# Patient Record
Sex: Male | Born: 1955 | Race: White | Hispanic: No | Marital: Single | Smoking: Former smoker
Health system: Southern US, Community
[De-identification: ages and names within clinical notes are randomized; demographics above are authoritative.]

## PROBLEM LIST (undated history)

## (undated) DIAGNOSIS — M791 Myalgia, unspecified site: Secondary | ICD-10-CM

## (undated) DIAGNOSIS — K219 Gastro-esophageal reflux disease without esophagitis: Secondary | ICD-10-CM

## (undated) DIAGNOSIS — H269 Unspecified cataract: Secondary | ICD-10-CM

## (undated) DIAGNOSIS — E785 Hyperlipidemia, unspecified: Secondary | ICD-10-CM

## (undated) DIAGNOSIS — Z720 Tobacco use: Secondary | ICD-10-CM

## (undated) DIAGNOSIS — R7303 Prediabetes: Secondary | ICD-10-CM

## (undated) DIAGNOSIS — J189 Pneumonia, unspecified organism: Secondary | ICD-10-CM

## (undated) DIAGNOSIS — J45909 Unspecified asthma, uncomplicated: Secondary | ICD-10-CM

## (undated) DIAGNOSIS — L301 Dyshidrosis [pompholyx]: Secondary | ICD-10-CM

## (undated) DIAGNOSIS — F1010 Alcohol abuse, uncomplicated: Secondary | ICD-10-CM

## (undated) DIAGNOSIS — M1990 Unspecified osteoarthritis, unspecified site: Secondary | ICD-10-CM

## (undated) DIAGNOSIS — J449 Chronic obstructive pulmonary disease, unspecified: Secondary | ICD-10-CM

## (undated) DIAGNOSIS — I10 Essential (primary) hypertension: Secondary | ICD-10-CM

## (undated) DIAGNOSIS — K759 Inflammatory liver disease, unspecified: Secondary | ICD-10-CM

## (undated) HISTORY — DX: Myalgia, unspecified site: M79.10

## (undated) HISTORY — DX: Unspecified cataract: H26.9

## (undated) HISTORY — DX: Alcohol abuse, uncomplicated: F10.10

## (undated) HISTORY — DX: Chronic obstructive pulmonary disease, unspecified: J44.9

## (undated) HISTORY — DX: Unspecified osteoarthritis, unspecified site: M19.90

## (undated) HISTORY — DX: Tobacco use: Z72.0

## (undated) HISTORY — DX: Hyperlipidemia, unspecified: E78.5

## (undated) HISTORY — DX: Gastro-esophageal reflux disease without esophagitis: K21.9

## (undated) HISTORY — DX: Essential (primary) hypertension: I10

## (undated) HISTORY — DX: Dyshidrosis (pompholyx): L30.1

---

## 2000-09-02 HISTORY — PX: HAND SURGERY: SHX662

## 2002-08-31 ENCOUNTER — Ambulatory Visit (HOSPITAL_BASED_OUTPATIENT_CLINIC_OR_DEPARTMENT_OTHER): Admission: RE | Admit: 2002-08-31 | Discharge: 2002-08-31 | Payer: Self-pay | Admitting: Orthopedic Surgery

## 2004-02-08 ENCOUNTER — Ambulatory Visit (HOSPITAL_COMMUNITY): Admission: RE | Admit: 2004-02-08 | Discharge: 2004-02-08 | Payer: Self-pay | Admitting: *Deleted

## 2004-07-22 ENCOUNTER — Inpatient Hospital Stay (HOSPITAL_COMMUNITY): Admission: AD | Admit: 2004-07-22 | Discharge: 2004-07-27 | Payer: Self-pay | Admitting: Internal Medicine

## 2004-07-22 ENCOUNTER — Ambulatory Visit: Payer: Self-pay | Admitting: Internal Medicine

## 2004-08-07 ENCOUNTER — Ambulatory Visit: Payer: Self-pay | Admitting: Internal Medicine

## 2004-09-18 ENCOUNTER — Ambulatory Visit (HOSPITAL_COMMUNITY): Admission: RE | Admit: 2004-09-18 | Discharge: 2004-09-18 | Payer: Self-pay | Admitting: Internal Medicine

## 2004-09-18 ENCOUNTER — Ambulatory Visit: Payer: Self-pay | Admitting: Internal Medicine

## 2004-10-02 ENCOUNTER — Ambulatory Visit: Payer: Self-pay | Admitting: Internal Medicine

## 2004-12-20 ENCOUNTER — Ambulatory Visit: Payer: Self-pay | Admitting: Internal Medicine

## 2004-12-28 ENCOUNTER — Ambulatory Visit: Payer: Self-pay

## 2005-01-01 ENCOUNTER — Ambulatory Visit: Payer: Self-pay | Admitting: Internal Medicine

## 2005-01-08 ENCOUNTER — Ambulatory Visit: Payer: Self-pay | Admitting: Internal Medicine

## 2005-02-08 LAB — FECAL OCCULT BLOOD, GUAIAC: Fecal Occult Blood: NEGATIVE

## 2006-04-21 ENCOUNTER — Ambulatory Visit: Payer: Self-pay | Admitting: Hospitalist

## 2006-10-23 ENCOUNTER — Ambulatory Visit: Payer: Self-pay | Admitting: Internal Medicine

## 2006-10-23 ENCOUNTER — Ambulatory Visit (HOSPITAL_COMMUNITY): Admission: RE | Admit: 2006-10-23 | Discharge: 2006-10-23 | Payer: Self-pay | Admitting: Hospitalist

## 2006-10-23 ENCOUNTER — Encounter (INDEPENDENT_AMBULATORY_CARE_PROVIDER_SITE_OTHER): Payer: Self-pay | Admitting: Pulmonary Disease

## 2006-10-23 DIAGNOSIS — E785 Hyperlipidemia, unspecified: Secondary | ICD-10-CM | POA: Insufficient documentation

## 2006-10-23 DIAGNOSIS — Z8719 Personal history of other diseases of the digestive system: Secondary | ICD-10-CM | POA: Insufficient documentation

## 2006-10-23 DIAGNOSIS — I10 Essential (primary) hypertension: Secondary | ICD-10-CM | POA: Insufficient documentation

## 2006-10-23 DIAGNOSIS — F1010 Alcohol abuse, uncomplicated: Secondary | ICD-10-CM | POA: Insufficient documentation

## 2006-10-23 DIAGNOSIS — K219 Gastro-esophageal reflux disease without esophagitis: Secondary | ICD-10-CM | POA: Insufficient documentation

## 2006-10-23 DIAGNOSIS — F528 Other sexual dysfunction not due to a substance or known physiological condition: Secondary | ICD-10-CM | POA: Insufficient documentation

## 2006-10-23 DIAGNOSIS — L301 Dyshidrosis [pompholyx]: Secondary | ICD-10-CM | POA: Insufficient documentation

## 2006-10-23 DIAGNOSIS — R000 Tachycardia, unspecified: Secondary | ICD-10-CM | POA: Insufficient documentation

## 2006-10-23 DIAGNOSIS — Z87891 Personal history of nicotine dependence: Secondary | ICD-10-CM | POA: Insufficient documentation

## 2006-10-27 ENCOUNTER — Telehealth: Payer: Self-pay | Admitting: *Deleted

## 2006-10-29 ENCOUNTER — Telehealth (INDEPENDENT_AMBULATORY_CARE_PROVIDER_SITE_OTHER): Payer: Self-pay | Admitting: Pulmonary Disease

## 2006-11-24 ENCOUNTER — Ambulatory Visit: Payer: Self-pay | Admitting: Internal Medicine

## 2007-03-27 ENCOUNTER — Telehealth (INDEPENDENT_AMBULATORY_CARE_PROVIDER_SITE_OTHER): Payer: Self-pay | Admitting: *Deleted

## 2007-04-01 ENCOUNTER — Telehealth: Payer: Self-pay | Admitting: Internal Medicine

## 2007-04-01 ENCOUNTER — Ambulatory Visit: Payer: Self-pay | Admitting: Hospitalist

## 2007-04-01 DIAGNOSIS — J069 Acute upper respiratory infection, unspecified: Secondary | ICD-10-CM | POA: Insufficient documentation

## 2007-04-01 DIAGNOSIS — J309 Allergic rhinitis, unspecified: Secondary | ICD-10-CM | POA: Insufficient documentation

## 2007-05-12 ENCOUNTER — Encounter (INDEPENDENT_AMBULATORY_CARE_PROVIDER_SITE_OTHER): Payer: Self-pay | Admitting: *Deleted

## 2007-05-12 ENCOUNTER — Ambulatory Visit: Payer: Self-pay | Admitting: Internal Medicine

## 2007-05-13 LAB — CONVERTED CEMR LAB
ALT: 77 units/L — ABNORMAL HIGH (ref 0–53)
AST: 72 units/L — ABNORMAL HIGH (ref 0–37)
Albumin: 4.6 g/dL (ref 3.5–5.2)
Alkaline Phosphatase: 71 units/L (ref 39–117)
BUN: 8 mg/dL (ref 6–23)
CO2: 27 meq/L (ref 19–32)
Calcium: 10 mg/dL (ref 8.4–10.5)
Chloride: 99 meq/L (ref 96–112)
Creatinine, Ser: 0.82 mg/dL (ref 0.40–1.50)
Glucose, Bld: 106 mg/dL — ABNORMAL HIGH (ref 70–99)
Potassium: 4.1 meq/L (ref 3.5–5.3)
Sodium: 140 meq/L (ref 135–145)
Total Bilirubin: 0.6 mg/dL (ref 0.3–1.2)
Total Protein: 7.7 g/dL (ref 6.0–8.3)

## 2007-05-20 ENCOUNTER — Encounter (INDEPENDENT_AMBULATORY_CARE_PROVIDER_SITE_OTHER): Payer: Self-pay | Admitting: *Deleted

## 2007-05-20 ENCOUNTER — Ambulatory Visit: Payer: Self-pay | Admitting: Internal Medicine

## 2007-05-20 LAB — CONVERTED CEMR LAB
BUN: 9 mg/dL (ref 6–23)
CO2: 25 meq/L (ref 19–32)
Calcium: 9.7 mg/dL (ref 8.4–10.5)
Chloride: 97 meq/L (ref 96–112)
Creatinine, Ser: 0.78 mg/dL (ref 0.40–1.50)
Glucose, Bld: 128 mg/dL — ABNORMAL HIGH (ref 70–99)
Potassium: 4.2 meq/L (ref 3.5–5.3)
Sodium: 134 meq/L — ABNORMAL LOW (ref 135–145)

## 2007-05-21 ENCOUNTER — Telehealth: Payer: Self-pay | Admitting: *Deleted

## 2007-06-16 ENCOUNTER — Telehealth: Payer: Self-pay | Admitting: *Deleted

## 2007-06-16 ENCOUNTER — Ambulatory Visit (HOSPITAL_COMMUNITY): Admission: RE | Admit: 2007-06-16 | Discharge: 2007-06-16 | Payer: Self-pay | Admitting: Infectious Diseases

## 2007-06-16 ENCOUNTER — Ambulatory Visit: Payer: Self-pay | Admitting: Hospitalist

## 2007-06-23 ENCOUNTER — Telehealth: Payer: Self-pay | Admitting: *Deleted

## 2007-06-30 ENCOUNTER — Ambulatory Visit: Payer: Self-pay | Admitting: Internal Medicine

## 2007-06-30 ENCOUNTER — Ambulatory Visit (HOSPITAL_COMMUNITY): Admission: RE | Admit: 2007-06-30 | Discharge: 2007-06-30 | Payer: Self-pay | Admitting: Hospitalist

## 2007-09-14 ENCOUNTER — Ambulatory Visit: Payer: Self-pay | Admitting: Internal Medicine

## 2007-09-14 ENCOUNTER — Telehealth: Payer: Self-pay | Admitting: *Deleted

## 2007-09-14 ENCOUNTER — Encounter (INDEPENDENT_AMBULATORY_CARE_PROVIDER_SITE_OTHER): Payer: Self-pay | Admitting: *Deleted

## 2007-09-14 DIAGNOSIS — R059 Cough, unspecified: Secondary | ICD-10-CM | POA: Insufficient documentation

## 2007-09-14 DIAGNOSIS — R05 Cough: Secondary | ICD-10-CM

## 2007-09-14 LAB — CONVERTED CEMR LAB
ALT: 37 units/L (ref 0–53)
AST: 32 units/L (ref 0–37)
Albumin: 4.2 g/dL (ref 3.5–5.2)
Alkaline Phosphatase: 66 units/L (ref 39–117)
BUN: 7 mg/dL (ref 6–23)
CO2: 26 meq/L (ref 19–32)
Calcium: 9.4 mg/dL (ref 8.4–10.5)
Chloride: 96 meq/L (ref 96–112)
Cholesterol: 156 mg/dL (ref 0–200)
Creatinine, Ser: 0.71 mg/dL (ref 0.40–1.50)
Glucose, Bld: 76 mg/dL (ref 70–99)
HDL: 78 mg/dL (ref >39)
LDL Cholesterol: 58 mg/dL (ref 0–99)
Potassium: 3.6 meq/L (ref 3.5–5.3)
Sodium: 135 meq/L (ref 135–145)
Total Bilirubin: 0.3 mg/dL (ref 0.3–1.2)
Total CHOL/HDL Ratio: 2
Total Protein: 7 g/dL (ref 6.0–8.3)
Triglycerides: 100 mg/dL (ref <150)
VLDL: 20 mg/dL (ref 0–40)

## 2007-09-17 ENCOUNTER — Ambulatory Visit (HOSPITAL_COMMUNITY): Admission: RE | Admit: 2007-09-17 | Discharge: 2007-09-17 | Payer: Self-pay | Admitting: Internal Medicine

## 2007-09-23 ENCOUNTER — Telehealth (INDEPENDENT_AMBULATORY_CARE_PROVIDER_SITE_OTHER): Payer: Self-pay | Admitting: *Deleted

## 2007-09-29 ENCOUNTER — Telehealth (INDEPENDENT_AMBULATORY_CARE_PROVIDER_SITE_OTHER): Payer: Self-pay | Admitting: *Deleted

## 2007-10-29 ENCOUNTER — Telehealth: Payer: Self-pay | Admitting: *Deleted

## 2007-11-01 DIAGNOSIS — M791 Myalgia, unspecified site: Secondary | ICD-10-CM

## 2007-11-01 HISTORY — DX: Myalgia, unspecified site: M79.10

## 2007-11-26 ENCOUNTER — Telehealth (INDEPENDENT_AMBULATORY_CARE_PROVIDER_SITE_OTHER): Payer: Self-pay | Admitting: *Deleted

## 2007-11-27 ENCOUNTER — Telehealth (INDEPENDENT_AMBULATORY_CARE_PROVIDER_SITE_OTHER): Payer: Self-pay | Admitting: *Deleted

## 2007-11-27 DIAGNOSIS — IMO0001 Reserved for inherently not codable concepts without codable children: Secondary | ICD-10-CM | POA: Insufficient documentation

## 2007-12-01 ENCOUNTER — Encounter: Payer: Self-pay | Admitting: Internal Medicine

## 2007-12-01 ENCOUNTER — Ambulatory Visit: Payer: Self-pay | Admitting: Internal Medicine

## 2007-12-01 LAB — CONVERTED CEMR LAB
ALT: 65 units/L — ABNORMAL HIGH (ref 0–53)
AST: 54 units/L — ABNORMAL HIGH (ref 0–37)
Albumin: 3.7 g/dL (ref 3.5–5.2)
Alkaline Phosphatase: 56 units/L (ref 39–117)
BUN: 9 mg/dL (ref 6–23)
CO2: 30 meq/L (ref 19–32)
Calcium: 9.7 mg/dL (ref 8.4–10.5)
Chloride: 96 meq/L (ref 96–112)
Creatinine, Ser: 0.91 mg/dL (ref 0.40–1.50)
Glucose, Bld: 96 mg/dL (ref 70–99)
Magnesium: 2 mg/dL (ref 1.5–2.5)
Potassium: 3.8 meq/L (ref 3.5–5.3)
Sodium: 136 meq/L (ref 135–145)
TSH: 4.203 microintl units/mL (ref 0.350–5.50)
Total Bilirubin: 0.5 mg/dL (ref 0.3–1.2)
Total CK: 105 units/L (ref 7–232)
Total Protein: 6.7 g/dL (ref 6.0–8.3)

## 2007-12-09 ENCOUNTER — Telehealth (INDEPENDENT_AMBULATORY_CARE_PROVIDER_SITE_OTHER): Payer: Self-pay | Admitting: *Deleted

## 2007-12-16 ENCOUNTER — Telehealth (INDEPENDENT_AMBULATORY_CARE_PROVIDER_SITE_OTHER): Payer: Self-pay | Admitting: *Deleted

## 2007-12-18 ENCOUNTER — Telehealth (INDEPENDENT_AMBULATORY_CARE_PROVIDER_SITE_OTHER): Payer: Self-pay | Admitting: *Deleted

## 2007-12-30 ENCOUNTER — Encounter (INDEPENDENT_AMBULATORY_CARE_PROVIDER_SITE_OTHER): Payer: Self-pay | Admitting: *Deleted

## 2007-12-30 ENCOUNTER — Ambulatory Visit: Payer: Self-pay | Admitting: Internal Medicine

## 2007-12-30 DIAGNOSIS — J449 Chronic obstructive pulmonary disease, unspecified: Secondary | ICD-10-CM | POA: Insufficient documentation

## 2007-12-30 DIAGNOSIS — J4489 Other specified chronic obstructive pulmonary disease: Secondary | ICD-10-CM | POA: Insufficient documentation

## 2007-12-31 LAB — CONVERTED CEMR LAB
ALT: 98 units/L — ABNORMAL HIGH (ref 0–53)
AST: 95 units/L — ABNORMAL HIGH (ref 0–37)
Albumin: 4.4 g/dL (ref 3.5–5.2)
Alkaline Phosphatase: 71 units/L (ref 39–117)
BUN: 12 mg/dL (ref 6–23)
CO2: 22 meq/L (ref 19–32)
Calcium: 9.5 mg/dL (ref 8.4–10.5)
Chloride: 96 meq/L (ref 96–112)
Creatinine, Ser: 0.91 mg/dL (ref 0.40–1.50)
Glucose, Bld: 105 mg/dL — ABNORMAL HIGH (ref 70–99)
Potassium: 4.7 meq/L (ref 3.5–5.3)
Sodium: 132 meq/L — ABNORMAL LOW (ref 135–145)
Total Bilirubin: 0.5 mg/dL (ref 0.3–1.2)
Total Protein: 7.4 g/dL (ref 6.0–8.3)

## 2008-01-29 ENCOUNTER — Encounter (INDEPENDENT_AMBULATORY_CARE_PROVIDER_SITE_OTHER): Payer: Self-pay | Admitting: *Deleted

## 2008-01-29 ENCOUNTER — Ambulatory Visit: Payer: Self-pay | Admitting: Internal Medicine

## 2008-01-30 LAB — CONVERTED CEMR LAB
ALT: 87 units/L — ABNORMAL HIGH (ref 0–53)
AST: 69 units/L — ABNORMAL HIGH (ref 0–37)
Albumin: 4.2 g/dL (ref 3.5–5.2)
Alkaline Phosphatase: 78 units/L (ref 39–117)
BUN: 13 mg/dL (ref 6–23)
CO2: 24 meq/L (ref 19–32)
Calcium: 9.3 mg/dL (ref 8.4–10.5)
Chloride: 98 meq/L (ref 96–112)
Creatinine, Ser: 0.84 mg/dL (ref 0.40–1.50)
Glucose, Bld: 72 mg/dL (ref 70–99)
Potassium: 4 meq/L (ref 3.5–5.3)
Sodium: 135 meq/L (ref 135–145)
Total Bilirubin: 0.5 mg/dL (ref 0.3–1.2)
Total Protein: 6.5 g/dL (ref 6.0–8.3)

## 2008-03-02 ENCOUNTER — Telehealth: Payer: Self-pay | Admitting: *Deleted

## 2008-05-12 ENCOUNTER — Telehealth: Payer: Self-pay | Admitting: *Deleted

## 2008-05-23 ENCOUNTER — Telehealth (INDEPENDENT_AMBULATORY_CARE_PROVIDER_SITE_OTHER): Payer: Self-pay | Admitting: *Deleted

## 2008-06-13 ENCOUNTER — Telehealth: Payer: Self-pay | Admitting: Internal Medicine

## 2008-06-15 ENCOUNTER — Telehealth: Payer: Self-pay | Admitting: Infectious Disease

## 2008-06-28 ENCOUNTER — Encounter: Payer: Self-pay | Admitting: Internal Medicine

## 2008-06-28 ENCOUNTER — Ambulatory Visit: Payer: Self-pay | Admitting: Internal Medicine

## 2008-06-28 DIAGNOSIS — J111 Influenza due to unidentified influenza virus with other respiratory manifestations: Secondary | ICD-10-CM | POA: Insufficient documentation

## 2008-06-28 LAB — CONVERTED CEMR LAB
ALT: 91 units/L — ABNORMAL HIGH (ref 0–53)
AST: 82 units/L — ABNORMAL HIGH (ref 0–37)
Albumin: 4 g/dL (ref 3.5–5.2)
Alkaline Phosphatase: 64 units/L (ref 39–117)
BUN: 11 mg/dL (ref 6–23)
Basophils Absolute: 0 10*3/uL (ref 0.0–0.1)
Basophils Relative: 0 % (ref 0–1)
CO2: 25 meq/L (ref 19–32)
Calcium: 9.4 mg/dL (ref 8.4–10.5)
Chloride: 96 meq/L (ref 96–112)
Creatinine, Ser: 1.75 mg/dL — ABNORMAL HIGH (ref 0.40–1.50)
Eosinophils Absolute: 0 10*3/uL (ref 0.0–0.7)
Eosinophils Relative: 0 % (ref 0–5)
Glucose, Bld: 128 mg/dL — ABNORMAL HIGH (ref 70–99)
HCT: 41.2 % (ref 39.0–52.0)
Hemoglobin: 14.6 g/dL (ref 13.0–17.0)
Lymphocytes Relative: 6 % — ABNORMAL LOW (ref 12–46)
Lymphs Abs: 0.8 10*3/uL (ref 0.7–4.0)
MCHC: 35.4 g/dL (ref 30.0–36.0)
MCV: 93.3 fL (ref 78.0–100.0)
Monocytes Absolute: 1 10*3/uL (ref 0.1–1.0)
Monocytes Relative: 7 % (ref 3–12)
Neutro Abs: 12.6 10*3/uL — ABNORMAL HIGH (ref 1.7–7.7)
Neutrophils Relative %: 87 % — ABNORMAL HIGH (ref 43–77)
Platelets: 133 10*3/uL — ABNORMAL LOW (ref 150–400)
Potassium: 3.6 meq/L (ref 3.5–5.3)
RBC: 4.42 M/uL (ref 4.22–5.81)
RDW: 13.1 % (ref 11.5–15.5)
Sodium: 134 meq/L — ABNORMAL LOW (ref 135–145)
Total Bilirubin: 0.9 mg/dL (ref 0.3–1.2)
Total Protein: 7 g/dL (ref 6.0–8.3)
WBC: 14.5 10*3/uL — ABNORMAL HIGH (ref 4.0–10.5)

## 2008-07-07 ENCOUNTER — Ambulatory Visit: Payer: Self-pay | Admitting: Internal Medicine

## 2008-07-07 DIAGNOSIS — I959 Hypotension, unspecified: Secondary | ICD-10-CM | POA: Insufficient documentation

## 2008-07-11 ENCOUNTER — Telehealth: Payer: Self-pay | Admitting: Internal Medicine

## 2008-07-11 ENCOUNTER — Ambulatory Visit: Payer: Self-pay | Admitting: *Deleted

## 2008-07-13 ENCOUNTER — Telehealth: Payer: Self-pay | Admitting: Licensed Clinical Social Worker

## 2008-10-11 ENCOUNTER — Telehealth: Payer: Self-pay | Admitting: *Deleted

## 2008-10-25 ENCOUNTER — Encounter: Payer: Self-pay | Admitting: Internal Medicine

## 2008-10-25 ENCOUNTER — Ambulatory Visit (HOSPITAL_COMMUNITY): Admission: RE | Admit: 2008-10-25 | Discharge: 2008-10-25 | Payer: Self-pay | Admitting: Internal Medicine

## 2008-10-25 ENCOUNTER — Ambulatory Visit: Payer: Self-pay | Admitting: Internal Medicine

## 2008-10-26 ENCOUNTER — Telehealth (INDEPENDENT_AMBULATORY_CARE_PROVIDER_SITE_OTHER): Payer: Self-pay | Admitting: Internal Medicine

## 2008-11-01 ENCOUNTER — Ambulatory Visit (HOSPITAL_COMMUNITY): Admission: RE | Admit: 2008-11-01 | Discharge: 2008-11-01 | Payer: Self-pay | Admitting: Internal Medicine

## 2008-11-01 ENCOUNTER — Encounter (INDEPENDENT_AMBULATORY_CARE_PROVIDER_SITE_OTHER): Payer: Self-pay | Admitting: Internal Medicine

## 2008-11-08 ENCOUNTER — Ambulatory Visit: Payer: Self-pay | Admitting: Internal Medicine

## 2008-11-08 ENCOUNTER — Encounter (INDEPENDENT_AMBULATORY_CARE_PROVIDER_SITE_OTHER): Payer: Self-pay | Admitting: *Deleted

## 2008-11-09 LAB — CONVERTED CEMR LAB
BUN: 12 mg/dL (ref 6–23)
CO2: 25 meq/L (ref 19–32)
Calcium: 10.2 mg/dL (ref 8.4–10.5)
Chloride: 98 meq/L (ref 96–112)
Cholesterol: 263 mg/dL — ABNORMAL HIGH (ref 0–200)
Creatinine, Ser: 0.89 mg/dL (ref 0.40–1.50)
Glucose, Bld: 87 mg/dL (ref 70–99)
HDL: 56 mg/dL (ref >39)
LDL Cholesterol: 166 mg/dL — ABNORMAL HIGH (ref 0–99)
Potassium: 4.1 meq/L (ref 3.5–5.3)
Sodium: 137 meq/L (ref 135–145)
Total CHOL/HDL Ratio: 4.7
Triglycerides: 207 mg/dL — ABNORMAL HIGH (ref <150)
VLDL: 41 mg/dL — ABNORMAL HIGH (ref 0–40)

## 2009-01-03 ENCOUNTER — Ambulatory Visit: Payer: Self-pay | Admitting: Internal Medicine

## 2009-02-16 ENCOUNTER — Ambulatory Visit: Payer: Self-pay | Admitting: Internal Medicine

## 2009-02-16 ENCOUNTER — Encounter: Payer: Self-pay | Admitting: Internal Medicine

## 2009-02-17 LAB — CONVERTED CEMR LAB
Cholesterol: 239 mg/dL — ABNORMAL HIGH (ref 0–200)
HDL: 65 mg/dL (ref >39)
LDL Cholesterol: 137 mg/dL — ABNORMAL HIGH (ref 0–99)
Total CHOL/HDL Ratio: 3.7
Triglycerides: 185 mg/dL — ABNORMAL HIGH (ref <150)
VLDL: 37 mg/dL (ref 0–40)

## 2009-03-28 ENCOUNTER — Telehealth: Payer: Self-pay | Admitting: Internal Medicine

## 2009-03-29 ENCOUNTER — Telehealth: Payer: Self-pay | Admitting: Internal Medicine

## 2009-06-29 ENCOUNTER — Telehealth: Payer: Self-pay | Admitting: Internal Medicine

## 2009-08-04 ENCOUNTER — Telehealth: Payer: Self-pay | Admitting: Internal Medicine

## 2009-08-08 ENCOUNTER — Telehealth: Payer: Self-pay | Admitting: Internal Medicine

## 2009-08-24 ENCOUNTER — Telehealth (INDEPENDENT_AMBULATORY_CARE_PROVIDER_SITE_OTHER): Payer: Self-pay | Admitting: *Deleted

## 2009-08-29 ENCOUNTER — Telehealth (INDEPENDENT_AMBULATORY_CARE_PROVIDER_SITE_OTHER): Payer: Self-pay | Admitting: *Deleted

## 2009-10-09 ENCOUNTER — Ambulatory Visit: Payer: Self-pay | Admitting: Internal Medicine

## 2009-10-09 ENCOUNTER — Telehealth: Payer: Self-pay | Admitting: *Deleted

## 2009-10-16 ENCOUNTER — Ambulatory Visit: Payer: Self-pay | Admitting: Internal Medicine

## 2009-10-16 DIAGNOSIS — M25519 Pain in unspecified shoulder: Secondary | ICD-10-CM | POA: Insufficient documentation

## 2009-10-16 LAB — CONVERTED CEMR LAB

## 2009-10-17 ENCOUNTER — Telehealth: Payer: Self-pay | Admitting: *Deleted

## 2009-10-25 ENCOUNTER — Ambulatory Visit: Payer: Self-pay | Admitting: Sports Medicine

## 2009-11-22 ENCOUNTER — Ambulatory Visit: Payer: Self-pay | Admitting: Sports Medicine

## 2009-11-24 ENCOUNTER — Telehealth: Payer: Self-pay | Admitting: Internal Medicine

## 2009-12-13 ENCOUNTER — Telehealth (INDEPENDENT_AMBULATORY_CARE_PROVIDER_SITE_OTHER): Payer: Self-pay | Admitting: *Deleted

## 2009-12-14 ENCOUNTER — Ambulatory Visit: Payer: Self-pay | Admitting: Internal Medicine

## 2009-12-14 DIAGNOSIS — J209 Acute bronchitis, unspecified: Secondary | ICD-10-CM | POA: Insufficient documentation

## 2009-12-21 ENCOUNTER — Telehealth: Payer: Self-pay | Admitting: Internal Medicine

## 2010-01-30 ENCOUNTER — Telehealth: Payer: Self-pay | Admitting: Internal Medicine

## 2010-03-09 ENCOUNTER — Telehealth: Payer: Self-pay | Admitting: Internal Medicine

## 2010-03-27 ENCOUNTER — Ambulatory Visit: Payer: Self-pay | Admitting: Internal Medicine

## 2010-03-27 LAB — CONVERTED CEMR LAB

## 2010-03-28 ENCOUNTER — Telehealth: Payer: Self-pay | Admitting: Licensed Clinical Social Worker

## 2010-03-28 LAB — CONVERTED CEMR LAB
ALT: 67 units/L — ABNORMAL HIGH (ref 0–53)
AST: 57 units/L — ABNORMAL HIGH (ref 0–37)
Albumin: 5.2 g/dL (ref 3.5–5.2)
Alkaline Phosphatase: 54 units/L (ref 39–117)
BUN: 8 mg/dL (ref 6–23)
Bilirubin Urine: NEGATIVE
CO2: 28 meq/L (ref 19–32)
Calcium: 10 mg/dL (ref 8.4–10.5)
Chloride: 97 meq/L (ref 96–112)
Creatinine, Ser: 0.85 mg/dL (ref 0.40–1.50)
Glucose, Bld: 59 mg/dL — ABNORMAL LOW (ref 70–99)
Hemoglobin, Urine: NEGATIVE
Ketones, ur: NEGATIVE mg/dL
Leukocytes, UA: NEGATIVE
Nitrite: NEGATIVE
Potassium: 3.7 meq/L (ref 3.5–5.3)
Protein, ur: NEGATIVE mg/dL
Sodium: 137 meq/L (ref 135–145)
Specific Gravity, Urine: 1.009 (ref 1.005–1.0)
TSH: 2.324 microintl units/mL (ref 0.350–4.5)
Total Bilirubin: 0.6 mg/dL (ref 0.3–1.2)
Total Protein: 7.8 g/dL (ref 6.0–8.3)
Urine Glucose: NEGATIVE mg/dL
Urobilinogen, UA: 0.2 (ref 0.0–1.0)
pH: 6.5 (ref 5.0–8.0)

## 2010-04-20 ENCOUNTER — Telehealth: Payer: Self-pay | Admitting: Internal Medicine

## 2010-04-30 ENCOUNTER — Ambulatory Visit: Payer: Self-pay | Admitting: Internal Medicine

## 2010-04-30 LAB — CONVERTED CEMR LAB
ALT: 52 units/L (ref 0–53)
AST: 43 units/L — ABNORMAL HIGH (ref 0–37)
Albumin: 4.5 g/dL (ref 3.5–5.2)
Alkaline Phosphatase: 62 units/L (ref 39–117)
Bilirubin, Direct: 0.1 mg/dL (ref 0.0–0.3)
Indirect Bilirubin: 0.3 mg/dL (ref 0.0–0.9)
Total Bilirubin: 0.4 mg/dL (ref 0.3–1.2)
Total Protein: 7.2 g/dL (ref 6.0–8.3)

## 2010-05-10 ENCOUNTER — Telehealth: Payer: Self-pay | Admitting: Internal Medicine

## 2010-05-16 ENCOUNTER — Telehealth: Payer: Self-pay | Admitting: Internal Medicine

## 2010-05-21 ENCOUNTER — Encounter: Payer: Self-pay | Admitting: Licensed Clinical Social Worker

## 2010-06-22 ENCOUNTER — Ambulatory Visit: Payer: Self-pay | Admitting: Internal Medicine

## 2010-06-25 ENCOUNTER — Ambulatory Visit: Payer: Self-pay | Admitting: Internal Medicine

## 2010-06-25 LAB — CONVERTED CEMR LAB
Cholesterol: 197 mg/dL (ref 0–200)
HDL: 49 mg/dL (ref >39)
LDL Cholesterol: 73 mg/dL (ref 0–99)
Total CHOL/HDL Ratio: 4
Triglycerides: 376 mg/dL — ABNORMAL HIGH (ref <150)
VLDL: 75 mg/dL — ABNORMAL HIGH (ref 0–40)

## 2010-07-10 ENCOUNTER — Encounter: Payer: Self-pay | Admitting: Gastroenterology

## 2010-08-07 ENCOUNTER — Encounter (INDEPENDENT_AMBULATORY_CARE_PROVIDER_SITE_OTHER): Payer: Self-pay | Admitting: *Deleted

## 2010-08-08 ENCOUNTER — Ambulatory Visit: Payer: Self-pay | Admitting: Gastroenterology

## 2010-08-22 ENCOUNTER — Ambulatory Visit: Payer: Self-pay | Admitting: Gastroenterology

## 2010-08-22 LAB — HM COLONOSCOPY

## 2010-08-24 ENCOUNTER — Encounter: Payer: Self-pay | Admitting: Gastroenterology

## 2010-08-30 ENCOUNTER — Telehealth: Payer: Self-pay | Admitting: Internal Medicine

## 2010-09-02 HISTORY — PX: REPLACEMENT TOTAL HIP W/  RESURFACING IMPLANTS: SUR1222

## 2010-09-28 ENCOUNTER — Ambulatory Visit: Admission: RE | Admit: 2010-09-28 | Discharge: 2010-09-28 | Payer: Self-pay | Source: Home / Self Care

## 2010-09-30 LAB — CONVERTED CEMR LAB
ALT: 102 units/L — ABNORMAL HIGH (ref 0–53)
ALT: 45 units/L (ref 0–53)
AST: 105 units/L — ABNORMAL HIGH (ref 0–37)
AST: 33 units/L (ref 0–37)
Albumin: 4.2 g/dL (ref 3.5–5.2)
Albumin: 4.4 g/dL (ref 3.5–5.2)
Alkaline Phosphatase: 70 units/L (ref 39–117)
Alkaline Phosphatase: 75 units/L (ref 39–117)
BUN: 11 mg/dL (ref 6–23)
BUN: 6 mg/dL (ref 6–23)
Basophils Absolute: 0.1 10*3/uL (ref 0.0–0.1)
Basophils Relative: 0 % (ref 0–1)
CO2: 23 meq/L (ref 19–32)
CO2: 30 meq/L (ref 19–32)
Calcium: 9.5 mg/dL (ref 8.4–10.5)
Calcium: 9.7 mg/dL (ref 8.4–10.5)
Chloride: 94 meq/L — ABNORMAL LOW (ref 96–112)
Chloride: 98 meq/L (ref 96–112)
Creatinine, Ser: 0.74 mg/dL (ref 0.40–1.50)
Creatinine, Ser: 0.92 mg/dL (ref 0.40–1.50)
Eosinophils Absolute: 0.2 10*3/uL (ref 0.0–0.7)
Eosinophils Relative: 2 % (ref 0–5)
Glucose, Bld: 108 mg/dL — ABNORMAL HIGH (ref 70–99)
Glucose, Bld: 83 mg/dL (ref 70–99)
HCT: 45.1 % (ref 39.0–52.0)
HCT: 46.4 % (ref 39.0–52.0)
Hemoglobin: 15.4 g/dL (ref 13.0–17.0)
Hemoglobin: 16.4 g/dL (ref 13.0–17.0)
INR: 0.9 (ref 0.0–1.5)
Lymphocytes Relative: 25 % (ref 12–46)
Lymphs Abs: 3 10*3/uL (ref 0.7–4.0)
MCHC: 34.1 g/dL (ref 30.0–36.0)
MCHC: 35.4 g/dL (ref 30.0–36.0)
MCV: 95.2 fL (ref 78.0–100.0)
MCV: 96 fL (ref 78.0–100.0)
Monocytes Absolute: 1.3 10*3/uL — ABNORMAL HIGH (ref 0.1–1.0)
Monocytes Relative: 11 % (ref 3–12)
Neutro Abs: 7.4 10*3/uL (ref 1.7–7.7)
Neutrophils Relative %: 62 % (ref 43–77)
Platelets: 173 10*3/uL (ref 150–400)
Platelets: 223 10*3/uL (ref 150–400)
Potassium: 3.9 meq/L (ref 3.5–5.3)
Potassium: 4 meq/L (ref 3.5–5.3)
Prothrombin Time: 12.2 s (ref 11.6–15.2)
RBC: 4.7 M/uL (ref 4.22–5.81)
RBC: 4.87 M/uL (ref 4.22–5.81)
RDW: 12.9 % (ref 11.5–15.5)
RDW: 13.1 % (ref 11.5–14.0)
Sodium: 135 meq/L (ref 135–145)
Sodium: 136 meq/L (ref 135–145)
TSH: 3.732 microintl units/mL (ref 0.350–4.50)
Total Bilirubin: 0.4 mg/dL (ref 0.3–1.2)
Total Bilirubin: 1 mg/dL (ref 0.3–1.2)
Total Protein: 7.4 g/dL (ref 6.0–8.3)
Total Protein: 8.1 g/dL (ref 6.0–8.3)
WBC: 11.9 10*3/uL — ABNORMAL HIGH (ref 4.0–10.5)
WBC: 8.3 10*3/uL (ref 4.0–10.5)
aPTT: 29 s (ref 24–37)

## 2010-10-01 ENCOUNTER — Telehealth: Payer: Self-pay | Admitting: Internal Medicine

## 2010-10-02 NOTE — Progress Notes (Signed)
Summary: refill/ hla  Phone Note Refill Request Message from:  Fax from Pharmacy on March 09, 2010 4:33 PM  Refills Requested: Medication #1:  ATENOLOL 25 MG TABS Take 1 tablet by mouth once a day   Dosage confirmed as above?Dosage Confirmed   Last Refilled: 6/7 Initial call taken by: Marin Roberts RN,  March 09, 2010 4:34 PM  Follow-up for Phone Call       Follow-up by: Clerance Lav MD,  March 09, 2010 7:54 PM    Prescriptions: ATENOLOL 25 MG TABS (ATENOLOL) Take 1 tablet by mouth once a day  #31 x 6   Entered and Authorized by:   Clerance Lav MD   Signed by:   Clerance Lav MD on 03/09/2010   Method used:   Telephoned to ...       [facility] Department (retail)       [address] [place], [place]  [address]       Ph: [phone]       Fax: [phone]   RxID:   [id number]

## 2010-10-02 NOTE — Progress Notes (Signed)
Summary: med refil/gp  Phone Note Refill Request Message from:  Fax from Pharmacy on May 16, 2010 4:17 PM  Refills Requested: Medication #1:  VIAGRA 100 MG TABS .qdtab   Last Refilled: 02/10/2010  Method Requested: Telephone to Pharmacy Initial call taken by: Chinita Pester RN,  May 16, 2010 4:17 PM  Follow-up for Phone Call        Refill approved-nurse to complete Follow-up by: Clerance Lav MD,  May 18, 2010 1:46 PM    Prescriptions: VIAGRA 100 MG TABS (SILDENAFIL CITRATE) .qdtab  #30 x 0   Entered and Authorized by:   Clerance Lav MD   Signed by:   Clerance Lav MD on 05/18/2010   Method used:   Telephoned to ...       [facility] Department (retail)       [address] [place], [place]  [address]       Ph: [phone]       Fax: [phone]   RxID:   [id number]   Appended Document: med refil/gp Viagra Rx refill request faxed to [facility] pharmacy.

## 2010-10-02 NOTE — Progress Notes (Signed)
Summary: refill/gg  Phone Note Refill Request  on December 21, 2009 4:57 PM  Refills Requested: Medication #1:  SPIRIVA HANDIHALER 18 MCG CAPS Inhale 1 puff once daily.   Last Refilled: 09/29/2009  Method Requested: Fax to Local Pharmacy Initial call taken by: Merrie Roof RN,  December 21, 2009 4:57 PM    Prescriptions: SPIRIVA HANDIHALER 18 MCG CAPS (TIOTROPIUM BROMIDE MONOHYDRATE) Inhale 1 puff once daily.  #1 x 12   Entered and Authorized by:   Clerance Lav MD   Signed by:   Clerance Lav MD on 12/25/2009   Method used:   Faxed to ...       [facility] Department (retail)       [address] [place], [place]  [address]       Ph: [phone]       Fax: [phone]   RxID:   [id number] SPIRIVA HANDIHALER 18 MCG CAPS (TIOTROPIUM BROMIDE MONOHYDRATE) Inhale 1 puff once daily.  #1 x 12   Entered and Authorized by:   Clerance Lav MD   Signed by:   Clerance Lav MD on 12/25/2009   Method used:   Telephoned to ...       [facility] Department (retail)       [address] [place], [place]  [address]       Ph: [phone]       Fax: [phone]   RxID:   [id number]

## 2010-10-02 NOTE — Progress Notes (Signed)
Summary: refill/ hla  Phone Note Refill Request Message from:  Pharmacy on November 24, 2009 4:41 PM  Refills Requested: Medication #1:  ALBUTEROL 90 MCG/ACT AERS Inhale 1-2 puffs every 4 hours as needed for shortness of breath.   Last Refilled: 12/21 Initial call taken by: Marin Roberts RN,  November 24, 2009 4:41 PM  Follow-up for Phone Call       Follow-up by: Clerance Lav MD,  November 27, 2009 10:38 PM    Prescriptions: ALBUTEROL 90 MCG/ACT AERS (ALBUTEROL) Inhale 1-2 puffs every 4 hours as needed for shortness of breath.  #1 x 12   Entered and Authorized by:   Clerance Lav MD   Signed by:   Clerance Lav MD on 11/27/2009   Method used:   Telephoned to ...       [facility] Department (retail)       [address] [place], [place]  [address]       Ph: [phone]       Fax: [phone]   RxID:   [id number]

## 2010-10-02 NOTE — Letter (Signed)
Summary: [facility] Instructions  [facility]  [address] [place], [place] [address]   Phone: [phone]  Fax: [phone]       Omkar Fisch    Nov 10, 1955    MRN: [id number]        Procedure Day Dorna Bloom:  [facility] 08/22/10     Arrival Time:  9:30am     Procedure Time: 10:30am     Location of Procedure:                    Juliann Pares  [facility] (4th Floor)                       PREPARATION FOR COLONOSCOPY WITH MOVIPREP   Starting 5 days prior to your procedure  FRIDAY 12/16  do not eat nuts, seeds, popcorn, corn, beans, peas,  salads, or any raw vegetables.  Do not take any fiber supplements (e.g. Metamucil, Citrucel, and Benefiber).  THE DAY BEFORE YOUR PROCEDURE         DATE:  TUESDAY  12/20  1.  Drink clear liquids the entire day-NO SOLID FOOD  2.  Do not drink anything colored red or purple.  Avoid juices with pulp.  No orange juice.  3.  Drink at least 64 oz. (8 glasses) of fluid/clear liquids during the day to prevent dehydration and help the prep work efficiently.  CLEAR LIQUIDS INCLUDE: Water Jello Ice Popsicles Tea (sugar ok, no milk/cream) Powdered fruit flavored drinks Coffee (sugar ok, no milk/cream) Gatorade Juice: apple, white grape, white cranberry  Lemonade Clear bullion, consomm, broth Carbonated beverages (any kind) Strained chicken noodle soup Hard Candy                             4.  In the morning, mix first dose of MoviPrep solution:    Empty 1 Pouch A and 1 Pouch B into the disposable container    Add lukewarm drinking water to the top line of the container. Mix to dissolve    Refrigerate (mixed solution should be used within 24 hrs)  5.  Begin drinking the prep at 5:00 p.m. The MoviPrep container is divided by 4 marks.   Every 15 minutes drink the solution down to the next mark (approximately 8 oz) until the full liter is complete.   6.  Follow completed prep with 16 oz of clear liquid of your choice  (Nothing red or purple).  Continue to drink clear liquids until bedtime.  7.  Before going to bed, mix second dose of MoviPrep solution:    Empty 1 Pouch A and 1 Pouch B into the disposable container    Add lukewarm drinking water to the top line of the container. Mix to dissolve    Refrigerate  THE DAY OF YOUR PROCEDURE      DATE: [facility]  12/21  Beginning at  5:30 a.m. (5 hours before procedure):         1. Every 15 minutes, drink the solution down to the next mark (approx 8 oz) until the full liter is complete.  2. Follow completed prep with 16 oz. of clear liquid of your choice.    3. You may drink clear liquids until  8:30am (2 HOURS BEFORE PROCEDURE).   MEDICATION INSTRUCTIONS  Unless otherwise instructed, you should take regular prescription medications with a small sip of water   as early as possible  the morning of your procedure.   Additional medication instructions: Hold Triam/HCTZ the morning of procedure.  Take your Atenolol and asthma medicine the morning of procedure.         OTHER INSTRUCTIONS  You will need a responsible adult at least 53 years of age to accompany you and drive you home.   This person must remain in the waiting room during your procedure.  Wear loose fitting clothing that is easily removed.  Leave jewelry and other valuables at home.  However, you may wish to bring a book to read or  an iPod/MP3 player to listen to music as you wait for your procedure to start.  Remove all body piercing jewelry and leave at home.  Total time from sign-in until discharge is approximately 2-3 hours.  You should go home directly after your procedure and rest.  You can resume normal activities the  day after your procedure.  The day of your procedure you should not:   Drive   Make legal decisions   Operate machinery   Drink alcohol   Return to work  You will receive specific instructions about eating, activities and medications before you  leave.    The above instructions have been reviewed and explained to me by   Wyona Almas RN  August 08, 2010 10:47 AM     I fully understand and can verbalize these instructions _____________________________ Date _________

## 2010-10-02 NOTE — Miscellaneous (Signed)
Summary: [facility] Previsit/prep  Clinical Lists Changes  Allergies: Changed allergy or adverse reaction from ASPIRIN (ASPIRIN) to ASPIRIN (ASPIRIN) Observations: Added new observation of ALLERGY REV: Done (08/08/2010 10:02)

## 2010-10-02 NOTE — Progress Notes (Signed)
Summary: Appointment  Phone Note Outgoing Call   Call placed by: Angelina Ok RN,  October 17, 2009 2:15 PM Call placed to: Patient Summary of Call: Attempts to call pt x4 no answer.  Pt to be notified of appointment.  Letter mailed to notify pt oif appointment with the [facility].Angelina Ok RN  October 17, 2009 2:41 PM  Initial call taken by: Angelina Ok RN,  October 17, 2009 2:41 PM

## 2010-10-02 NOTE — Progress Notes (Signed)
Summary: phone/gg  Phone Note Call from Patient   Summary of Call:  Pt called and concerned because his BP was elevated to  136/92 today.  He wanted to take an extra BP pill tonight.  No offers no complaints. Pt has scheduled appointment tomorrow AM..   Pt advised not to take an extra pill but deat low salt diet and come in tomorrow as scheduled Initial call taken by: Merrie Roof RN,  December 13, 2009 3:31 PM  Follow-up for Phone Call        136/92 is not a concern.  He should eat whatever he likes and follow-up as scheduled. Follow-up by: Ulyess Mort MD,  December 13, 2009 3:45 PM

## 2010-10-02 NOTE — Assessment & Plan Note (Signed)
Summary: [facility] PT CHRONIC SHOULDER ISSUES/MJD   Vital Signs:  Patient profile:   55 year old male Height:      63 inches Weight:      125 pounds BP sitting:   141 / 86  Vitals Entered By: Lillia Pauls CMA (October 25, 2009 1:50 PM)  Primary Provider:  Clerance Lav MD  CC:  left shoulder pain.  History of Present Illness: Pt c/o several months of left shoulder pain, started slowly mostly at the area of the [facility] joint, mostly with overhead activities.  Over the past few months it has gotten worse and now is a constant ache encompassing most of the shoulder region.  Overhead movements are still the worst, also sleeping on it hurts.  He hasn't tried much for it except some advil.  No shoulder trauma or procedures. No neck pain. No numbness, tingling, or weakness.   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 Had recent xray which showed some AC joint degenerative arthritis.  Problems Prior to Update: 1)  Shoulder Pain, Left  (ICD-719.41) 2)  Hypotension  (ICD-458.9) 3)  Uri  (ICD-465.9) 4)  Influenza With Other Respiratory Manifestations  (ICD-487.1) 5)  COPD  (ICD-496) 6)  Myalgia  (ICD-729.1) 7)  Cough, Chronic  (ICD-786.2) 8)  Allergic Rhinitis  (ICD-477.9) 9)  Tachycardia  (ICD-785.0) 10)  Vaccine Against Disease Nos  (ICD-V05.9) 11)  Alcoholic Hepatitis, Hx of  (ICD-V12.79) 12)  Dyshidrotic Eczema  (ICD-705.81) 13)  Hyperlipidemia  (ICD-272.4) 14)  Erectile Dysfunction  (ICD-302.72) 15)  Hypertension, Benign Essential  (ICD-401.1) 16)  Tobacco Abuse  (ICD-305.1) 17)  Abuse, Alcohol, Unspecified  (ICD-305.00) 18)  Gerd  (ICD-530.81)  Allergies: 1)  ! Aspirin (Aspirin) 2)  ! Tylenol  Social History: Reviewed history from 05/12/2007 and no changes required. Single. Smokes 1-2 ppd. Drinks anywhere from 3 to 12 beers daily.  No illicit drug use.  Review of Systems MS:  Complains of joint pain,  muscle aches, and stiffness. Neuro:  Complains of weakness; denies numbness and tingling.  Physical Exam  General:  NAD, smells of smoke Msk:  SHOULDERS: - Mild symmetric shoulder atrophy & protraction. - Diffuse ttp, mostly over AC joint. - FROM except for mildly limited ER. - Full strength except for slightly decreased SSp strength 2/2 pain. - (+) cross arm reproducing diffuse pain. - (+) NEERS w/o relieft sign on left. - (+) Hawkin's/Empty Can on left. - (+) Painful arc on left.  (-) Drop arm on left. - Pain on active and passive motions. - Negative Speed's. Mildly (+) Yergason's on left.. - Negative O'brien's/Clunk. No labaral instability. - Normal scapular motion.  Pulses:  good distal pulses Neurologic:  sensation intact Additional Exam:  Musculoskeletal Ultrasound: Longitudinal and transverse views of the shoulder revealed the following:  Biceps Tendon: Normal. AC Joint: Extensive calcification. Subscapularis: Normal. No impingement. Supraspinatus: Linear calfication toward superior aspect. No signs of acute tear. No intrasubstance fluid collection. Borderline impingement on dynamic testing. Mild increased thickening of overlying hypo-echogenic bursa. Infraspinatus: Normal. Teres Minor: Normal. Deltoid: Normal. Humeral Head: Normal.  * Examination partially limited by patient motion.       Impression & Recommendations:  Problem # 1:  SHOULDER PAIN, LEFT (ICD-719.41) History and exam most suggestive of a sub-acromial bursitis. AC jt arthritis and RC impingement contribute to this picture as well.  After obtaining informed verbal consent from the patient,  the posterior aspect of his left shoulder was prepped with alcohol and betadine. Ethyl chloride was used to anesthetize the skin. A 4:2 mixture of 1% lidocaine and kenalog (40mg /ml) was injected into the left sub-acromial bursa via a posterior approach without complications or difficulty. The patient tolerated this  procedure well.  - Immediately seek medical attention for worsening pain, shoulder discoloration or swelling, fever, or any other concerns. - Relafen. - Home Rotator cuff theraband exercises daily. Exercises demonstrated to the patient and respective handouts were provided to the patient. - RTC in 4 weeks or sooner as needed any concerns.  The following medications were removed from the medication list:    Nabumetone 500 Mg Tabs (Nabumetone) .Marland Kitchen... 1 tab/cap by mouth with food daily. His updated medication list for this problem includes:    Diclofenac Sodium 75 Mg Tbec (Diclofenac sodium) ..... One tab two times a day  Orders: US EXTREMITY NON-VASC REAL-TIME IMG ([address])  Complete Medication List: 1)  Viagra 100 Mg Tabs (Sildenafil citrate) .... .qdtab 2)  Nexium 40 Mg Cpdr (Esomeprazole magnesium) .... Take 1 tablet by mouth once a day 3)  Spiriva Handihaler 18 Mcg Caps (Tiotropium bromide monohydrate) .... Inhale 1 puff once daily. 4)  Albuterol 90 Mcg/act Aers (Albuterol) .... Inhale 1-2 puffs every 4 hours as needed for shortness of breath. 5)  Maxzide-25 37.5-25 Mg Tabs (Triamterene-hctz) .... Take 1 tablet by mouth once a day 6)  Clobetasol Propionate 0.05 % Oint (Clobetasol propionate) .... Apply to the itchy areas on legs twice a day for seven days. 7)  Atenolol 25 Mg Tabs (Atenolol) .... Take 1 tablet by mouth once a day 8)  Nasonex 50 Mcg/act Susp (Mometasone furoate) .... One puff in each nostril once daily 9)  Pravastatin Sodium 40 Mg Tabs (Pravastatin sodium) .... Take 1 tablet by mouth once a day 10)  Diclofenac Sodium 75 Mg Tbec (Diclofenac sodium) .... One tab two times a day  Other Orders: Kenalog 10 mg inj (U0454) Prescriptions: DICLOFENAC SODIUM 75 MG TBEC (DICLOFENAC SODIUM) one tab two times a day  #42 x 0   Entered by:   Lillia Pauls CMA   Authorized by:   Valarie Merino MD   Signed by:   Lillia Pauls CMA on 10/26/2009   Method used:   Telephoned to ...        [facility] Department (retail)       [address] [place], [place]  [address]       Ph: [phone]       Fax: [phone]   RxID:   [id number] NABUMETONE 500 MG TABS (NABUMETONE) 1 tab/cap by mouth with food daily.  #60 x 0   Entered and Authorized by:   Valarie Merino MD   Signed by:   Valarie Merino MD on 10/25/2009   Method used:   Print then Give to Patient   RxID:   [id number] NABUMETONE 500 MG TABS (NABUMETONE) 1 tab/cap by mouth with food daily.  #60 x 0   Entered by:   Lillia Pauls CMA   Authorized by:   Valarie Merino MD   Signed by:   Lillia Pauls CMA on 10/25/2009   Method used:   Print then Give to Patient   RxID:   [id number] NABUMETONE 500 MG TABS (NABUMETONE) 1 tab/cap by mouth with food daily.  #60 x 0   Entered and Authorized by:   Valarie Merino MD   Signed by:  Valarie Merino MD on 10/25/2009   Method used:   Print then Give to Patient   RxID:   [id number]

## 2010-10-02 NOTE — Assessment & Plan Note (Signed)
Summary: EST-3 MONTH F/U VISIT/CH   Vital Signs:  Patient profile:   55 year old male Height:      63 inches Weight:      137.49 pounds BMI:     24.44 Temp:     97.4 degrees F oral Pulse rate:   76 / minute BP sitting:   138 / 86  (right arm)  Vitals Entered By: Filomena Jungling NT II (June 22, 2010 9:40 AM) CC: followup visit Is Patient Diabetic? No Pain Assessment Patient in pain? yes     Location: neck and shoulder Intensity: 10 Type: aching Onset of pain  1 month Nutritional Status BMI of 19 -24 = normal  Does patient need assistance? Functional Status Self care Ambulation Normal   Primary Care Jori Frerichs:  Clerance Lav MD  CC:  followup visit.  History of Present Illness: 55 year old man with PMH as noted in emr is here for follow up.  His bp is at goal and he is complaint with all his meds.  Prevention: He is due for Colonoscopy.  Tobacco abuse: He wants to quit and interested but does not have a quit date  He denies any new sicknesses or hospitalizations, no chest pain episodes, no fevers, no chills, no abdominal or urinary concerns. No recent changes in appetite, weight.   Preventive Screening-Counseling & Management  Alcohol-Tobacco     Smoking Cessation Counseling: yes  Current Medications (verified): 1)  Viagra 100 Mg Tabs (Sildenafil Citrate) .... .qdtab 2)  Nexium 40 Mg  Cpdr (Esomeprazole Magnesium) .... Take 1 Tablet By Mouth Once A Day 3)  Spiriva Handihaler 18 Mcg Caps (Tiotropium Bromide Monohydrate) .... Inhale 1 Puff Once Daily. 4)  Albuterol 90 Mcg/act Aers (Albuterol) .... Inhale 1-2 Puffs Every 4 Hours As Needed For Shortness of Breath. 5)  Maxzide-25 37.5-25 Mg  Tabs (Triamterene-Hctz) .... Take 1 Tablet By Mouth Once A Day 6)  Clobetasol Propionate 0.05 % Oint (Clobetasol Propionate) .... Apply To The Itchy Areas On Legs Twice A Day For Seven Days. 7)  Atenolol 25 Mg Tabs (Atenolol) .... Take 1 Tablet By Mouth Once A Day 8)  Nasonex 50  Mcg/act Susp (Mometasone Furoate) .... One Puff in Each Nostril Once Daily 9)  Pravastatin Sodium 40 Mg Tabs (Pravastatin Sodium) .... Take 1 Tablet By Mouth Once A Day 10)  Diclofenac Sodium 75 Mg Tbec (Diclofenac Sodium) .... One Tab Once Daily With Food  Allergies (verified): 1)  ! Aspirin (Aspirin) 2)  ! 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  Sister had MI at 10-35.  Social History: Last updated: 05/12/2007 Single. Smokes 1-2 ppd. Drinks anywhere from 3 to 12 beers daily.  No illicit drug use.  Risk Factors: Alcohol Use: 4+ (03/27/2010) Exercise: yes (03/27/2010)  Risk Factors: Smoking Status: current (03/27/2010) Packs/Day: 1.5 (03/27/2010)  Review of Systems      See HPI  Physical Exam  General:  Well-developed,well-nourished,in no acute distress; alert,appropriate and cooperative throughout examination Head:  normocephalic and atraumatic.   Eyes:  vision grossly intact, pupils equal, pupils round, and pupils reactive to light.   Ears:  L ear normal and R cerumen impaction.   Nose:  nasal turbinates inflamed  Mouth:  no exudates, pharynx pink and moist and poor dentition.   Neck:  supple, full  ROM, and no masses.   Lungs:  normal respiratory effort, no intercostal retractions, no accessory muscle use, rhonchi, noisy chest with occassional wheezing.  Heart:  normal rate, regular rhythm, no murmur, no gallop, and no rub.   Abdomen:  soft, non-tender, and normal bowel sounds.   Msk:  Unchanged from last office visit with exception of significantly decreased pain on all provacative maneuvers. Normal neurovascular examination.  some soreness and pain in the neck right side.  Neurologic:  sensation intact Psych:  Cognition and  judgment appear intact. Alert and cooperative with normal attention span and concentration. No apparent delusions, illusions, hallucinations   Impression & Recommendations:  Problem # 1:  COPD (ICD-496) stable. no shortness of breath on exertion. advised tobocco cessation.  His updated medication list for this problem includes:    Spiriva Handihaler 18 Mcg Caps (Tiotropium bromide monohydrate) ..... Inhale 1 puff once daily.    Albuterol 90 Mcg/act Aers (Albuterol) ..... Inhale 1-2 puffs every 4 hours as needed for shortness of breath.  Vaccines Reviewed: Pneumovax: Pneumovax (03/27/2010)   Flu Vax: Fluvax Non-MCR (06/16/2007)  Problem # 2:  MYALGIA (ICD-729.1) Pt has neck pain. I asked him to start some cold compression and use diclofenac as needed.  His updated medication list for this problem includes:    Diclofenac Sodium 75 Mg Tbec (Diclofenac sodium) ..... One tab once daily with food  Problem # 3:  GERD (ICD-530.81)  NO singificant symptoms. Continue nexium use.   His updated medication list for this problem includes:    Nexium 40 Mg Cpdr (Esomeprazole magnesium) .Marland Kitchen... Take 1 tablet by mouth once a day  Problem # 4:  HYPERLIPIDEMIA (ICD-272.4) will arrange for fasting lipid panel next week. Does not complain of muscle pain in general and tolerates pravastatin well.  His updated medication list for this problem includes:    Pravastatin Sodium 40 Mg Tabs (Pravastatin sodium) .Marland Kitchen... Take 1 tablet by mouth once a day  Future Orders: T-Lipid Profile ([id number]) ... 06/25/2010  Problem # 5:  HYPERTENSION, BENIGN ESSENTIAL (ICD-401.1) BP stable and well controlled.  His updated medication list for this problem includes:    Maxzide-25 37.5-25 Mg Tabs (Triamterene-hctz) .Marland Kitchen... Take 1 tablet by mouth once a day    Atenolol 25 Mg Tabs (Atenolol) .Marland Kitchen... Take 1 tablet by mouth once a day  BP today: 138/86 Prior BP: 138/72 (03/27/2010)  Labs Reviewed: K+: 3.7 (03/27/2010) Creat:  : 0.85 (03/27/2010)   Chol: 239 (02/16/2009)   HDL: 65 (02/16/2009)   LDL: 137 (02/16/2009)   TG: 185 (02/16/2009)  Complete Medication List: 1)  Viagra 100 Mg Tabs (Sildenafil citrate) .... .qdtab 2)  Nexium 40 Mg Cpdr (Esomeprazole magnesium) .... Take 1 tablet by mouth once a day 3)  Spiriva Handihaler 18 Mcg Caps (Tiotropium bromide monohydrate) .... Inhale 1 puff once daily. 4)  Albuterol 90 Mcg/act Aers (Albuterol) .... Inhale 1-2 puffs every 4 hours as needed for shortness of breath. 5)  Maxzide-25 37.5-25 Mg Tabs (Triamterene-hctz) .... Take 1 tablet by mouth once a day 6)  Clobetasol Propionate 0.05 % Oint (Clobetasol propionate) .... Apply to the itchy areas on legs twice a day for seven days. 7)  Atenolol 25 Mg Tabs (Atenolol) .... Take 1 tablet by mouth once a day 8)  Nasonex 50 Mcg/act Susp (Mometasone furoate) .... One puff in each nostril once daily 9)  Pravastatin Sodium 40 Mg Tabs (Pravastatin sodium) .... Take 1 tablet by mouth once a day 10)  Diclofenac Sodium  75 Mg Tbec (Diclofenac sodium) .... One tab once daily with food  Other Orders: Influenza Vaccine NON MCR (57846)  Patient Instructions: 1)  Please schedule a follow-up appointment in 3 months. 2)  Tobacco is very bad for your health and your loved ones! You Should stop smoking!. 3)  Stop Smoking Tips: Choose a Quit date. Cut down before the Quit date. decide what you will do as a substitute when you feel the urge to smoke(gum,toothpick,exercise).   Orders Added: 1)  Est. Patient Level III [96295] 2)  T-Lipid Profile [[id number]] 3)  Influenza Vaccine NON MCR [00028]   Immunizations Administered:  Influenza Vaccine # 1:    Vaccine Type: Fluvax Non-MCR    Site: right deltoid    Mfr: GlaxoSmithKline    Dose: 0.5 ml    Route: IM    Given by: Stanton Kidney Ditzler RN    Exp. Date: 03/02/2011    Lot #: [id number]    VIS given: 03/27/10 version given June 22, 2010.  Flu Vaccine Consent Questions:    Do you  have a history of severe allergic reactions to this vaccine? no    Any prior history of allergic reactions to egg and/or gelatin? no    Do you have a sensitivity to the preservative Thimersol? no    Do you have a past history of Guillan-Barre Syndrome? no    Do you currently have an acute febrile illness? no    Have you ever had a severe reaction to latex? no    Vaccine information given and explained to patient? yes   Immunizations Administered:  Influenza Vaccine # 1:    Vaccine Type: Fluvax Non-MCR    Site: right deltoid    Mfr: GlaxoSmithKline    Dose: 0.5 ml    Route: IM    Given by: Stanton Kidney Ditzler RN    Exp. Date: 03/02/2011    Lot #: [id number]    VIS given: 03/27/10 version given June 22, 2010.  Prevention & Chronic Care Immunizations   Influenza vaccine: Fluvax Non-MCR  (06/22/2010)   Influenza vaccine deferral: Deferred  (03/27/2010)    Tetanus booster: 03/27/2010: Tdap   Td booster deferral: Refused  (10/16/2009)   Tetanus booster due: 06/03/2015    Pneumococcal vaccine: Pneumovax  (03/27/2010)   Pneumococcal vaccine due: 06/03/2015  Colorectal Screening   Hemoccult: Negative  (02/08/2005)   Hemoccult action/deferral: Refused  (12/14/2009)    Colonoscopy: Not documented   Colonoscopy action/deferral: GI referral  (03/27/2010)  Other Screening   PSA: Not documented   PSA action/deferral: Discussion deferred  (03/27/2010)   Smoking status: current  (03/27/2010)   Smoking cessation counseling: yes  (06/22/2010)  Lipids   Total Cholesterol: 239  (02/16/2009)   Lipid panel action/deferral: Lipid Panel ordered   LDL: 137  (02/16/2009)   LDL Direct: Not documented   HDL: 65  (02/16/2009)   Triglycerides: 185  (02/16/2009)    SGOT (AST): 43  (04/30/2010)   BMP action: Ordered   SGPT (ALT): 52  (04/30/2010)   Alkaline phosphatase: 62  (04/30/2010)   Total bilirubin: 0.4  (04/30/2010)    Lipid flowsheet reviewed?: Yes   Progress toward LDL goal:  Unchanged  Hypertension   Last Blood Pressure: 138 / 86  (06/22/2010)   Serum creatinine: 0.85  (03/27/2010)   Serum potassium 3.7  (03/27/2010)    Hypertension flowsheet reviewed?: Yes   Progress toward BP goal: At goal  Self-Management Support :   Personal Goals (by the next clinic  visit) :      Personal blood pressure goal: 140/90  (10/09/2009)     Personal LDL goal: 100  (10/09/2009)    Patient will work on the following items until the next clinic visit to reach self-care goals:     Medications and monitoring: take my medicines every day  (06/22/2010)     Eating: eat more vegetables, eat foods that are low in salt, eat baked foods instead of fried foods, eat fruit for snacks and desserts  (06/22/2010)     Activity: take a 30 minute walk every day  (06/22/2010)    Hypertension self-management support: Education handout, Resources for patients handout, Written self-care plan  (06/22/2010)   Hypertension self-care plan printed.   Hypertension education handout printed    Lipid self-management support: Education handout, Resources for patients handout, Written self-care plan  (06/22/2010)   Lipid self-care plan printed.   Lipid education handout printed      Resource handout printed.   Nursing Instructions: Give Flu vaccine today    Process Orders Check Orders Results:     [facility]: ABN not required for this insurance Tests Sent for requisitioning (June 22, 2010 10:59 AM):     06/25/2010: [facility] -- T-Lipid Profile [phone] (signed)

## 2010-10-02 NOTE — Progress Notes (Signed)
Summary: refill/ hla  Phone Note Refill Request Message from:  Patient on May 10, 2010 11:07 AM  Refills Requested: Medication #1:  MAXZIDE-25 37.5-25 MG  TABS Take 1 tablet by mouth once a day   Dosage confirmed as above?Dosage Confirmed  Medication #2:  PRAVASTATIN SODIUM 40 MG TABS Take 1 tablet by mouth once a day   Dosage confirmed as above?Dosage Confirmed last visit 03/2010  Initial call taken by: Marin Roberts RN,  May 10, 2010 11:08 AM  Follow-up for Phone Call       Follow-up by: Clerance Lav MD,  May 11, 2010 6:23 PM    Prescriptions: PRAVASTATIN SODIUM 40 MG TABS (PRAVASTATIN SODIUM) Take 1 tablet by mouth once a day  #30 x 3   Entered and Authorized by:   Clerance Lav MD   Signed by:   Clerance Lav MD on 05/11/2010   Method used:   Electronically to        [facility] [address] (retail)       [phone]       [place], [place]  [address]       Ph: [phone]       Fax: [phone]   RxID:   [id number] MAXZIDE-25 37.5-25 MG  TABS (TRIAMTERENE-HCTZ) Take 1 tablet by mouth once a day  #31 x 5   Entered and Authorized by:   Clerance Lav MD   Signed by:   Clerance Lav MD on 05/11/2010   Method used:   Electronically to        [facility] [address] (retail)       [phone]       [place], [place]  [address]       Ph: [phone]       Fax: [phone]   RxID:   [id number]

## 2010-10-02 NOTE — Assessment & Plan Note (Signed)
Summary: ACUTE-LAB ORDERS PER DR [facility] AND CHECK MEDS/CF[facility](SHAH)   Vital Signs:  Patient profile:   55 year old male Height:      63 inches (160.02 cm) Weight:      122.4 pounds (55.64 kg) BMI:     21.76 Temp:     96.9 degrees F (36.06 degrees C) oral Pulse rate:   71 / minute BP sitting:   138 / 72  (left arm)  Vitals Entered By: Stanton Kidney Ditzler RN (March 27, 2010 9:36 AM) Is Patient Diabetic? No Pain Assessment Patient in pain? no      Nutritional Status BMI of 19 -24 = normal Nutritional Status Detail appetite good  Have you ever been in a relationship where you felt threatened, hurt or afraid?denies   Does patient need assistance? Functional Status Self care Ambulation Normal Comments Needs labwork - fasting.   Primary Care Provider:  Clerance Lav MD   History of Present Illness: 55 year old man with PMH as noted in emr is here for medication refill and blood tests.  His bp is at goal and he is complaint with all his meds.  He wants to be checked for HIV.  Prevention: He is due for Colonoscopy, tetanus, tdap.  Tobacco abuse: He wants to quit and interested in talking to Child psychotherapist.   He denies any new sicknesses or hospitalizations, no chest pain episodes, no fevers, no chills, no abdominal or urinary concerns. No recent changes in appetite, weight.   Depression History:      The patient denies a depressed mood most of the day and a diminished interest in his usual daily activities.         Preventive Screening-Counseling & Management  Alcohol-Tobacco     Alcohol drinks/day: 4+     Alcohol type: beer     Smoking Status: current     Smoking Cessation Counseling: yes     Smoke Cessation Stage: contemplative     Packs/Day: 1.5     Year Started: 1970's  Caffeine-Diet-Exercise     Does Patient Exercise: yes     Type of exercise: walking     Times/week: 5  Problems Prior to Update: 1)  Special Screening For Malignant Neoplasms Colon  (ICD-V76.51) 2)   Health Screening  (ICD-V70.0) 3)  Bronchitis, Acute  (ICD-466.0) 4)  Shoulder Pain, Left  (ICD-719.41) 5)  Hypotension  (ICD-458.9) 6)  Uri  (ICD-465.9) 7)  Influenza With Other Respiratory Manifestations  (ICD-487.1) 8)  COPD  (ICD-496) 9)  Myalgia  (ICD-729.1) 10)  Cough, Chronic  (ICD-786.2) 11)  Allergic Rhinitis  (ICD-477.9) 12)  Tachycardia  (ICD-785.0) 13)  Vaccine Against Disease Nos  (ICD-V05.9) 14)  Alcoholic Hepatitis, Hx of  (ICD-V12.79) 15)  Dyshidrotic Eczema  (ICD-705.81) 16)  Hyperlipidemia  (ICD-272.4) 17)  Erectile Dysfunction  (ICD-302.72) 18)  Hypertension, Benign Essential  (ICD-401.1) 19)  Tobacco Abuse  (ICD-305.1) 20)  Abuse, Alcohol, Unspecified  (ICD-305.00) 21)  Gerd  (ICD-530.81)  Medications Prior to Update: 1)  Viagra 100 Mg Tabs (Sildenafil Citrate) .... .qdtab 2)  Nexium 40 Mg  Cpdr (Esomeprazole Magnesium) .... Take 1 Tablet By Mouth Once A Day 3)  Spiriva Handihaler 18 Mcg Caps (Tiotropium Bromide Monohydrate) .... Inhale 1 Puff Once Daily. 4)  Albuterol 90 Mcg/act Aers (Albuterol) .... Inhale 1-2 Puffs Every 4 Hours As Needed For Shortness of Breath. 5)  Maxzide-25 37.5-25 Mg  Tabs (Triamterene-Hctz) .... Take 1 Tablet By Mouth Once A Day 6)  Clobetasol Propionate  0.05 % Oint (Clobetasol Propionate) .... Apply To The Itchy Areas On Legs Twice A Day For Seven Days. 7)  Atenolol 25 Mg Tabs (Atenolol) .... Take 1 Tablet By Mouth Once A Day 8)  Nasonex 50 Mcg/act Susp (Mometasone Furoate) .... One Puff in Each Nostril Once Daily 9)  Pravastatin Sodium 40 Mg Tabs (Pravastatin Sodium) .... Take 1 Tablet By Mouth Once A Day 10)  Diclofenac Sodium 75 Mg Tbec (Diclofenac Sodium) .... One Tab Once Daily With Food 11)  Doxycycline Hyclate 100 Mg Caps (Doxycycline Hyclate) .... Take 1 Tablet By Mouth Two Times A Day  Current Medications (verified): 1)  Viagra 100 Mg Tabs (Sildenafil Citrate) .... .qdtab 2)  Nexium 40 Mg  Cpdr (Esomeprazole Magnesium) ....  Take 1 Tablet By Mouth Once A Day 3)  Spiriva Handihaler 18 Mcg Caps (Tiotropium Bromide Monohydrate) .... Inhale 1 Puff Once Daily. 4)  Albuterol 90 Mcg/act Aers (Albuterol) .... Inhale 1-2 Puffs Every 4 Hours As Needed For Shortness of Breath. 5)  Maxzide-25 37.5-25 Mg  Tabs (Triamterene-Hctz) .... Take 1 Tablet By Mouth Once A Day 6)  Clobetasol Propionate 0.05 % Oint (Clobetasol Propionate) .... Apply To The Itchy Areas On Legs Twice A Day For Seven Days. 7)  Atenolol 25 Mg Tabs (Atenolol) .... Take 1 Tablet By Mouth Once A Day 8)  Nasonex 50 Mcg/act Susp (Mometasone Furoate) .... One Puff in Each Nostril Once Daily 9)  Pravastatin Sodium 40 Mg Tabs (Pravastatin Sodium) .... Take 1 Tablet By Mouth Once A Day 10)  Diclofenac Sodium 75 Mg Tbec (Diclofenac Sodium) .... One Tab Once Daily With Food  Allergies: 1)  ! Aspirin (Aspirin) 2)  ! 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  Sister had MI at 69-35.  Social History: Last updated: 05/12/2007 Single. Smokes 1-2 ppd. Drinks anywhere from 3 to 12 beers daily.  No illicit drug use.  Risk Factors: Alcohol Use: 4+ (03/27/2010) Exercise: yes (03/27/2010)  Risk Factors: Smoking Status: current (03/27/2010) Packs/Day: 1.5 (03/27/2010)  Family History: Reviewed history from 05/12/2007 and no changes required. Father had MI at unknown age.  Sister had MI at 9-35.  Social History: Reviewed history from 05/12/2007 and no changes required. Single. Smokes 1-2 ppd. Drinks anywhere from 3 to 12 beers daily.  No illicit drug use.Packs/Day:  1.5  Review of Systems      See HPI  Physical Exam  Additional Exam:  Gen: AOx3, in no acute distress Eyes: PERRL, EOMI ENT:MMM, No erythema noted in posterior  pharynx Neck: No JVD, No LAP Chest: CTAB with  good respiratory effort CVS: regular rhythmic rate, NO M/R/G, S1 S2 normal Abdo: soft,ND, BS+x4, Non tender and No hepatosplenomegaly EXT: No odema noted Neuro: Non focal, gait is normal Skin: no rashes noted.    Impression & Recommendations:  Problem # 1:  HYPERTENSION, BENIGN ESSENTIAL (ICD-401.1) Assessment Improved Continue current meds. At goal.  His updated medication list for this problem includes:    Maxzide-25 37.5-25 Mg Tabs (Triamterene-hctz) .Marland Kitchen... Take 1 tablet by mouth once a day    Atenolol 25 Mg Tabs (Atenolol) .Marland Kitchen... Take 1 tablet by mouth once a day  Orders: T-Urinalysis (81003-65000)  BP today: 138/72 Prior BP: 138/92 (12/14/2009)  Labs Reviewed: K+: 4.1 (11/08/2008) Creat: : 0.89 (11/08/2008)   Chol: 239 (02/16/2009)  HDL: 65 (02/16/2009)   LDL: 137 (02/16/2009)   TG: 185 (02/16/2009)  Problem # 2:  TOBACCO ABUSE (ICD-305.1) Assessment: Unchanged Patient was counseled on smoking cessation strategies including medications and behavior modification options. Will refer him to Norlene Duel for counselling.  Orders: Social Work Referral (Social )  Encouraged smoking cessation and discussed different methods for smoking cessation.   Problem # 3:  HYPERLIPIDEMIA (ICD-272.4) Assessment: Unchanged Will check hepatic panel as statins were started 6 weeks ago.  His updated medication list for this problem includes:    Pravastatin Sodium 40 Mg Tabs (Pravastatin sodium) .Marland Kitchen... Take 1 tablet by mouth once a day  Orders: T-CMP with Estimated GFR ([id number])  Labs Reviewed: SGOT: 33 (10/25/2008)   SGPT: 45 (10/25/2008)   HDL:65 (02/16/2009), 56 (11/08/2008)  LDL:137 (02/16/2009), 166 (11/08/2008)  Chol:239 (02/16/2009), 263 (11/08/2008)  Trig:185 (02/16/2009), 207 (11/08/2008)  Problem # 4:  GERD (ICD-530.81) Assessment: Unchanged Continue Nexium at this time. Plan to refer for UGI endoscopy if  deteriorates.  His updated medication list for this problem includes:    Nexium 40 Mg Cpdr (Esomeprazole magnesium) .Marland Kitchen... Take 1 tablet by mouth once a day  Labs Reviewed: Hgb: 15.4 (10/25/2008)   Hct: 45.1 (10/25/2008)  Problem # 5:  Preventive Health Care (ICD-V70.0) Assessment: Comment Only Tdap, pnemovax and colonoscopy referral.  Reviewed preventive care protocols, scheduled due services, and updated immunizations.  Problem # 6:  HEALTH SCREENING (ICD-V70.0) Assessment: Comment Only Patient asked for an HIV test today. He denied about any dangerous sexual practices. Orders: T-HIV Antibody  (Reflex) ([id number])  Complete Medication List: 1)  Viagra 100 Mg Tabs (Sildenafil citrate) .... .qdtab 2)  Nexium 40 Mg Cpdr (Esomeprazole magnesium) .... Take 1 tablet by mouth once a day 3)  Spiriva Handihaler 18 Mcg Caps (Tiotropium bromide monohydrate) .... Inhale 1 puff once daily. 4)  Albuterol 90 Mcg/act Aers (Albuterol) .... Inhale 1-2 puffs every 4 hours as needed for shortness of breath. 5)  Maxzide-25 37.5-25 Mg Tabs (Triamterene-hctz) .... Take 1 tablet by mouth once a day 6)  Clobetasol Propionate 0.05 % Oint (Clobetasol propionate) .... Apply to the itchy areas on legs twice a day for seven days. 7)  Atenolol 25 Mg Tabs (Atenolol) .... Take 1 tablet by mouth once a day 8)  Nasonex 50 Mcg/act Susp (Mometasone furoate) .... One puff in each nostril once daily 9)  Pravastatin Sodium 40 Mg Tabs (Pravastatin sodium) .... Take 1 tablet by mouth once a day 10)  Diclofenac Sodium 75 Mg Tbec (Diclofenac sodium) .... One tab once daily with food  Other Orders: T-TSH ([id number]) Tdap => 80yrs IM (65784) Pneumococcal Vaccine (69629) Gastroenterology Referral (GI)  Patient Instructions: 1)  Please schedule a follow-up appointment as needed. 2)  Please schedule an appointment with your primary doctor in 3-6 months. 3)  Tobacco is very bad for your health and your loved ones! You  Should stop smoking!. 4)  Stop Smoking Tips: Choose a Quit date. Cut down before the Quit date. decide what you will do as a substitute when you feel the urge to smoke(gum,toothpick,exercise). 5)  It is important that you exercise regularly at least 20 minutes 5 times a week. If you develop chest pain, have severe difficulty breathing, or feel very tired , stop exercising immediately and seek medical attention. 6)  Schedule a colonoscopy/sigmoidoscopy to help detect colon cancer. 7)  Check your Blood Pressure regularly. If it is above: 160/100 you should make an appointment. 8)  Limit your  Sodium (Salt). Process Orders Check Orders Results:     [facility]: ABN not required for this insurance Tests Sent for requisitioning (March 27, 2010 9:33 PM):     03/27/2010: [facility] -- T-CMP with Estimated GFR [[id number]] (signed)     03/27/2010: [facility] -- T-TSH [phone] (signed)     03/27/2010: [facility] -- T-HIV Antibody  (Reflex) [[id number]] (signed)     03/27/2010: [facility] -- T-Urinalysis [[id number]] (signed)    Prevention & Chronic Care Immunizations   Influenza vaccine: Fluvax Non-MCR  (06/16/2007)   Influenza vaccine deferral: Deferred  (03/27/2010)    Tetanus booster: 03/27/2010: Tdap   Td booster deferral: Refused  (10/16/2009)    Pneumococcal vaccine: Pneumovax  (03/27/2010)  Colorectal Screening   Hemoccult: Negative  (02/08/2005)   Hemoccult action/deferral: Refused  (12/14/2009)    Colonoscopy: Not documented   Colonoscopy action/deferral: GI referral  (03/27/2010)  Other Screening   PSA: Not documented   PSA action/deferral: Discussion deferred  (03/27/2010)   Smoking status: current  (03/27/2010)   Smoking cessation counseling: yes  (03/27/2010)  Lipids   Total Cholesterol: 239  (02/16/2009)   Lipid panel action/deferral: Lipid Panel ordered   LDL: 137   (02/16/2009)   LDL Direct: Not documented   HDL: 65  (02/16/2009)   Triglycerides: 185  (02/16/2009)    SGOT (AST): 33  (10/25/2008)   BMP action: Ordered   SGPT (ALT): 45  (10/25/2008)   Alkaline phosphatase: 75  (10/25/2008)   Total bilirubin: 0.4  (10/25/2008)    Lipid flowsheet reviewed?: Yes   Progress toward LDL goal: Improved  Hypertension   Last Blood Pressure: 138 / 72  (03/27/2010)   Serum creatinine: 0.89  (11/08/2008)   Serum potassium 4.1  (11/08/2008)    Hypertension flowsheet reviewed?: Yes   Progress toward BP goal: At goal  Self-Management Support :   Personal Goals (by the next clinic visit) :      Personal blood pressure goal: 140/90  (10/09/2009)     Personal LDL goal: 100  (10/09/2009)    Patient will work on the following items until the next clinic visit to reach self-care goals:     Medications and monitoring: take my medicines every day, bring all of my medications to every visit  (03/27/2010)     Eating: eat more vegetables, use fresh or frozen vegetables, eat fruit for snacks and desserts  (03/27/2010)     Activity: take a 30 minute walk every day  (03/27/2010)    Hypertension self-management support: Written self-care plan, Resources for patients handout  (03/27/2010)   Hypertension self-care plan printed.    Lipid self-management support: Written self-care plan, Resources for patients handout  (03/27/2010)   Lipid self-care plan printed.      Resource handout printed.   Nursing Instructions: Give tetanus booster today Give Pneumovax today GI referral for screening colonoscopy (see order)   Process Orders Check Orders Results:     [facility]: ABN not required for this insurance Tests Sent for requisitioning (March 27, 2010 9:33 PM):     03/27/2010: [facility] -- T-CMP with Estimated GFR [[id number]] (signed)     03/27/2010: [facility] -- T-TSH [phone] (signed)     03/27/2010:  [facility] -- T-HIV Antibody  (Reflex) [[id number]] (signed)     03/27/2010: [facility] -- T-Urinalysis [[id number]] (signed)       Tetanus/Td Vaccine    Vaccine Type: Tdap  Site: right deltoid    Mfr: GlaxoSmithKline    Dose: 0.5 ml    Route: IM    Given by: Stanton Kidney Ditzler RN    Exp. Date: 03/01/2012    Lot #: [id number]    VIS given: 07/21/07 version given March 27, 2010.  Pneumovax Vaccine    Vaccine Type: Pneumovax    Site: left deltoid    Mfr: Merck    Dose: 0.5 ml    Route: IM    Given by: Stanton Kidney Ditzler RN    Exp. Date: 09/19/2011    Lot #: [id number]    VIS given: 03/30/96 version given March 27, 2010.

## 2010-10-02 NOTE — Assessment & Plan Note (Signed)
Summary: F/U,[facility]   Vital Signs:  Patient profile:   55 year old male BP sitting:   152 / 93  Vitals Entered By: Lillia Pauls CMA (November 22, 2009 1:33 PM)  Primary Provider:  Clerance Lav MD   History of Present Illness: Reports to f/u chronic left shoulder pain. Received a corticosteroid injection during LOV. Taking diclofenac and performing daily shoulder rehab exercises. Pain 75% decreased. No recent work activity. No complaints or concerns.   Allergies: 1)  ! Aspirin (Aspirin) 2)  ! Tylenol PMH-FH-SH reviewed for relevance  Physical Exam  General:  Well-developed,well-nourished,in no acute distress; alert,appropriate and cooperative throughout examination Msk:  Unchanged from last office visit with exception of significantly decreased pain on all provacative maneuvers. Normal neurovascular examination.   Impression & Recommendations:  Problem # 1:  SHOULDER PAIN, LEFT (ICD-719.41) Assessment Improved  - Diclofenac refilled. Will decrease dose to once daily as needed given his history of alcohol abuse. - Continue daily shoulder rehab exercises as tolerated. - f/u at our clinic as needed pain or any other concerns.  His updated medication list for this problem includes:    Diclofenac Sodium 75 Mg Tbec (Diclofenac sodium) ..... One tab once daily with food  Problem # 2:  ABUSE, ALCOHOL, UNSPECIFIED (ICD-305.00)  - closely f/u with PCP re: options for alcohol & tobacco cessation. We discussed complications of heavy alchol and tobacco use; including but not limited to cancer, prolonged injury healing time, and death.  Problem # 3:  TOBACCO ABUSE (ICD-305.1)  - closely f/u with PCP re: options for alcohol & tobacco cessation. We discussed complications of heavy alchol and tobacco use, including but not limited to cancer, prolonged healing time, and death.  Complete Medication List: 1)  Viagra 100 Mg Tabs (Sildenafil citrate) .... .qdtab 2)  Nexium 40 Mg Cpdr  (Esomeprazole magnesium) .... Take 1 tablet by mouth once a day 3)  Spiriva Handihaler 18 Mcg Caps (Tiotropium bromide monohydrate) .... Inhale 1 puff once daily. 4)  Albuterol 90 Mcg/act Aers (Albuterol) .... Inhale 1-2 puffs every 4 hours as needed for shortness of breath. 5)  Maxzide-25 37.5-25 Mg Tabs (Triamterene-hctz) .... Take 1 tablet by mouth once a day 6)  Clobetasol Propionate 0.05 % Oint (Clobetasol propionate) .... Apply to the itchy areas on legs twice a day for seven days. 7)  Atenolol 25 Mg Tabs (Atenolol) .... Take 1 tablet by mouth once a day 8)  Nasonex 50 Mcg/act Susp (Mometasone furoate) .... One puff in each nostril once daily 9)  Pravastatin Sodium 40 Mg Tabs (Pravastatin sodium) .... Take 1 tablet by mouth once a day 10)  Diclofenac Sodium 75 Mg Tbec (Diclofenac sodium) .... One tab once daily with food Prescriptions: DICLOFENAC SODIUM 75 MG TBEC (DICLOFENAC SODIUM) one tab once daily with food  #30 x 2   Entered by:   Lillia Pauls CMA   Authorized by:   Valarie Merino MD   Signed by:   Lillia Pauls CMA on 11/22/2009   Method used:   Print then Give to Patient   RxID:   [id number] DICLOFENAC SODIUM 75 MG TBEC (DICLOFENAC SODIUM) one tab once daily with food  #30 x 2   Entered by:   Lillia Pauls CMA   Authorized by:   Valarie Merino MD   Signed by:   Lillia Pauls CMA on 11/22/2009   Method used:   Print then Give to Patient   RxID:   [id number]

## 2010-10-02 NOTE — Assessment & Plan Note (Signed)
Summary: flu sx/gg   Vital Signs:  Patient profile:   55 year old male Height:      63 inches Weight:      125.9 pounds (57.23 kg) BMI:     22.38 Temp:     97.1 degrees F (36.17 degrees C) oral Pulse rate:   110 / minute BP sitting:   126 / 79  (right arm)  Vitals Entered By: Krystal Eaton Duncan Dull) (October 09, 2009 10:48 AM) CC: pt c/o productive, lungs sore, back hurt, body aches started 4-5 days ago Is Patient Diabetic? No Pain Assessment Patient in pain? yes     Location: back/lungs Intensity: 8 Type: squeezing Onset of pain  Constant 4-5 days Nutritional Status BMI of 25 - 29 = overweight  Have you ever been in a relationship where you felt threatened, hurt or afraid?No   Does patient need assistance? Functional Status Self care Ambulation Normal   Primary Care Provider:  Clerance Lav MD  CC:  pt c/o productive, lungs sore, back hurt, and body aches started 4-5 days ago.  History of Present Illness: Patient is 55 year old white male with PMH of alcohol abuse, smoking, COPD, Hyperlipidemia and HTN who presents to the clinic for:  1) Sore throat, shortness of breath, lower back pain, cough, productive with brown/green sputum, fever of 99.5-101, chills, runny nose and sneezing all of which began 4 days ago. Pt reports his symptoms are improving mildly. No sick contacts at home. Has not used inhalers more often. Has very little energy.   Preventive Screening-Counseling & Management  Alcohol-Tobacco     Alcohol drinks/day: 4+     Alcohol type: beer     Smoking Status: current     Smoking Cessation Counseling: yes     Smoke Cessation Stage: contemplative     Packs/Day: 0.5     Year Started: 1970's  Current Medications (verified): 1)  Viagra 100 Mg Tabs (Sildenafil Citrate) .... .qdtab 2)  Nexium 40 Mg  Cpdr (Esomeprazole Magnesium) .... Take 1 Tablet By Mouth Once A Day 3)  Spiriva Handihaler 18 Mcg Caps (Tiotropium Bromide Monohydrate) .... Inhale 1 Puff Once  Daily. 4)  Albuterol 90 Mcg/act Aers (Albuterol) .... Inhale 1-2 Puffs Every 4 Hours As Needed For Shortness of Breath. 5)  Maxzide-25 37.5-25 Mg  Tabs (Triamterene-Hctz) .... Take 1 Tablet By Mouth Once A Day 6)  Clobetasol Propionate 0.05 % Oint (Clobetasol Propionate) .... Apply To The Itchy Areas On Legs Twice A Day For Seven Days. 7)  Atenolol 25 Mg Tabs (Atenolol) .... Take 1 Tablet By Mouth Once A Day 8)  Nasonex 50 Mcg/act Susp (Mometasone Furoate) .... One Puff in Each Nostril Once Daily 9)  Pravastatin Sodium 40 Mg Tabs (Pravastatin Sodium) .... Take 1 Tablet By Mouth Once A Day 10)  Azithromycin 250 Mg Tabs (Azithromycin) .... Take 2 Tabs On Day One, Followed By 1 Tablet Daily Until Finished.  Allergies: 1)  ! Aspirin (Aspirin) 2)  ! Tylenol  Social History: Packs/Day:  0.5  Review of Systems General:  Complains of chills, fatigue, fever, loss of appetite, and weakness. Eyes:  Denies blurring, discharge, and eye irritation. ENT:  Complains of hoarseness, nasal congestion, sinus pressure, and sore throat; denies ear discharge and earache. CV:  Denies chest pain or discomfort. Resp:  Complains of chest discomfort, cough, shortness of breath, and sputum productive. GI:  Denies abdominal pain, change in bowel habits, nausea, and vomiting. GU:  Denies dysuria, urinary frequency, and  urinary hesitancy. MS:  Complains of low back pain and muscle aches.  Physical Exam  General:  alert and well-developed.   Head:  normocephalic and atraumatic.   Eyes:  vision grossly intact, pupils equal, pupils round, and pupils reactive to light.   Ears:  L ear normal and R cerumen impaction.   Nose:  nasal turbinates inflamed  Mouth:  no exudates, pharynx pink and moist and poor dentition.   Neck:  supple, full ROM, and no masses.   Lungs:  normal respiratory effort, no intercostal retractions, no accessory muscle use, normal breath sounds, no dullness, no fremitus, no crackles, and no  wheezes.   Heart:  normal rate, regular rhythm, no murmur, no gallop, and no rub.   Abdomen:  soft, non-tender, and normal bowel sounds.     Impression & Recommendations:  Problem # 1:  URI (ICD-465.9) Pt's symptoms are consistent with upper respiratory infection, of 5 days duration, likely viral in nature. However given pt's hx of COPD, along with fevers and brown productive sputum, there may be a secondary viral infection. Pt is not wheezing and physical exam was benign.  Plan: Z-pak and advised patient to take plenty of fluids Advised patient to contact clinic for worsening symptoms.  New Medication: Azithromycin 250 mg - take 2 tabs on day 1, followed by 1 tab by mouth once daily until finished   Complete Medication List: 1)  Viagra 100 Mg Tabs (Sildenafil citrate) .... .qdtab 2)  Nexium 40 Mg Cpdr (Esomeprazole magnesium) .... Take 1 tablet by mouth once a day 3)  Spiriva Handihaler 18 Mcg Caps (Tiotropium bromide monohydrate) .... Inhale 1 puff once daily. 4)  Albuterol 90 Mcg/act Aers (Albuterol) .... Inhale 1-2 puffs every 4 hours as needed for shortness of breath. 5)  Maxzide-25 37.5-25 Mg Tabs (Triamterene-hctz) .... Take 1 tablet by mouth once a day 6)  Clobetasol Propionate 0.05 % Oint (Clobetasol propionate) .... Apply to the itchy areas on legs twice a day for seven days. 7)  Atenolol 25 Mg Tabs (Atenolol) .... Take 1 tablet by mouth once a day 8)  Nasonex 50 Mcg/act Susp (Mometasone furoate) .... One puff in each nostril once daily 9)  Pravastatin Sodium 40 Mg Tabs (Pravastatin sodium) .... Take 1 tablet by mouth once a day 10)  Azithromycin 250 Mg Tabs (Azithromycin) .... Take 2 tabs on day one, followed by 1 tablet daily until finished.  Patient Instructions: 1)  Please take Azithromycin as directed. 2)  If symptoms do not improve in 10-14 days, please contact clinic.  Prescriptions: NASONEX 50 MCG/ACT SUSP (MOMETASONE FUROATE) One puff in each nostril once daily  #1 x  10   Entered and Authorized by:   Melida Quitter MD   Signed by:   Melida Quitter MD on 10/09/2009   Method used:   Print then Give to Patient   RxID:   [id number] AZITHROMYCIN 250 MG TABS (AZITHROMYCIN) Take 2 tabs on day one, followed by 1 tablet daily until finished.  #6 x 0   Entered and Authorized by:   Melida Quitter MD   Signed by:   Melida Quitter MD on 10/09/2009   Method used:   Print then Give to Patient   RxID:   [id number]    Prevention & Chronic Care Immunizations   Influenza vaccine: Fluvax Non-MCR  (06/16/2007)   Influenza vaccine deferral: Refused  (10/09/2009)    Tetanus booster: Not documented    Pneumococcal vaccine: Not documented  Colorectal Screening  Hemoccult: Negative  (02/08/2005)    Colonoscopy: Not documented  Other Screening   PSA: Not documented   Smoking status: current  (10/09/2009)   Smoking cessation counseling: yes  (10/09/2009)  Lipids   Total Cholesterol: 239  (02/16/2009)   LDL: 137  (02/16/2009)   LDL Direct: Not documented   HDL: 65  (02/16/2009)   Triglycerides: 185  (02/16/2009)    SGOT (AST): 33  (10/25/2008)   SGPT (ALT): 45  (10/25/2008)   Alkaline phosphatase: 75  (10/25/2008)   Total bilirubin: 0.4  (10/25/2008)    Lipid flowsheet reviewed?: Yes   Progress toward LDL goal: Improved  Hypertension   Last Blood Pressure: 126 / 79  (10/09/2009)   Serum creatinine: 0.89  (11/08/2008)   Serum potassium 4.1  (11/08/2008)    Hypertension flowsheet reviewed?: Yes   Progress toward BP goal: At goal  Self-Management Support :   Personal Goals (by the next clinic visit) :      Personal blood pressure goal: 140/90  (10/09/2009)     Personal LDL goal: 100  (10/09/2009)    Hypertension self-management support: Not documented    Lipid self-management support: Not documented

## 2010-10-02 NOTE — Assessment & Plan Note (Signed)
Summary: est-ck/fu/meds/cfb   Vital Signs:  Patient profile:   55 year old male Height:      63 inches (160.02 cm) Weight:      131.04 pounds (59.56 kg) BMI:     23.30 Temp:     97.3 degrees F (36.28 degrees C) oral Pulse rate:   98 / minute BP sitting:   117 / 75  (right arm)  Vitals Entered By: Angelina Ok RN (October 16, 2009 1:49 PM) Is Patient Diabetic? No Pain Assessment Patient in pain? yes     Location: shoulder Intensity: 9 Type: aching Onset of pain  Constant Nutritional Status BMI of 19 -24 = normal  Have you ever been in a relationship where you felt threatened, hurt or afraid?No   Does patient need assistance? Functional Status Self care Ambulation Normal Comments Needs refills.  Check out shoulder pain.   Primary Care Provider:  Clerance Lav MD   History of Present Illness: 55 yr old with recent URTI is here for follow up. He is doing better. 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 Pain is musculoskelatal in nature and persistant all day. He can not sleep on that side.   Depression History:      The patient denies a depressed mood most of the day and a diminished interest in his usual daily activities.         Preventive Screening-Counseling & Management  Alcohol-Tobacco     Alcohol drinks/day: 4+     Alcohol type: beer     Smoking Status: current     Smoking Cessation Counseling: yes     Smoke Cessation Stage: contemplative     Packs/Day: 0.5     Year Started: 1970's  Comments: Wants to quit.  Current Medications (verified): 1)  Viagra 100 Mg Tabs (Sildenafil Citrate) .... .qdtab 2)  Nexium 40 Mg  Cpdr (Esomeprazole Magnesium) .... Take 1 Tablet By Mouth Once A Day 3)  Spiriva Handihaler 18 Mcg Caps (Tiotropium Bromide Monohydrate) .... Inhale 1  Puff Once Daily. 4)  Albuterol 90 Mcg/act Aers (Albuterol) .... Inhale 1-2 Puffs Every 4 Hours As Needed For Shortness of Breath. 5)  Maxzide-25 37.5-25 Mg  Tabs (Triamterene-Hctz) .... Take 1 Tablet By Mouth Once A Day 6)  Clobetasol Propionate 0.05 % Oint (Clobetasol Propionate) .... Apply To The Itchy Areas On Legs Twice A Day For Seven Days. 7)  Atenolol 25 Mg Tabs (Atenolol) .... Take 1 Tablet By Mouth Once A Day 8)  Nasonex 50 Mcg/act Susp (Mometasone Furoate) .... One Puff in Each Nostril Once Daily 9)  Pravastatin Sodium 40 Mg Tabs (Pravastatin Sodium) .... Take 1 Tablet By Mouth Once A Day  Allergies (verified): 1)  ! Aspirin (Aspirin) 2)  ! 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  Sister had MI at 36-35.  Social History: Last updated: 05/12/2007 Single. Smokes 1-2 ppd. Drinks anywhere from 3 to 12 beers daily.  No illicit drug use.  Risk Factors: Alcohol Use: 4+ (10/16/2009) Exercise: yes (01/03/2009)  Risk Factors: Smoking Status: current (10/16/2009) Packs/Day: 0.5 (10/16/2009)  Review of Systems      See HPI  Physical Exam  General:  alert and well-developed.   Head:  normocephalic and atraumatic.   Eyes:  vision grossly intact, pupils equal, pupils round, and pupils reactive to light.   Ears:  L ear normal and R cerumen impaction.   Nose:  nasal turbinates inflamed  Mouth:  no exudates, pharynx pink and moist and poor dentition.   Neck:  supple, full ROM, and no masses.   Lungs:  normal respiratory effort, no intercostal retractions, no accessory muscle use, normal breath sounds, no dullness, no fremitus, no crackles, and no wheezes.   Heart:  normal rate, regular rhythm, no murmur, no gallop, and no rub.   Abdomen:  soft,  non-tender, and normal bowel sounds.   Msk:  shoulder girdle and acromian area tenderness. limited motion due to pain on left shoulder. patient is left handed.  Neurologic:  No cranial nerve deficits noted. Station and gait are normal. Plantar reflexes are down-going bilaterally. DTRs are symmetrical throughout. Sensory, motor and coordinative functions appear intact. Psych:  Cognition and judgment appear intact. Alert and cooperative with normal attention span and concentration. No apparent delusions, illusions, hallucinations   Impression & Recommendations:  Problem # 1:  COPD (ICD-496) pt is doing better with URTI and copd is stable. Once again talked about smoking cessation.  His updated medication list for this problem includes:    Spiriva Handihaler 18 Mcg Caps (Tiotropium bromide monohydrate) ..... Inhale 1 puff once daily.    Albuterol 90 Mcg/act Aers (Albuterol) ..... Inhale 1-2 puffs every 4 hours as needed for shortness of breath.  Problem # 2:  INFLUENZA WITH OTHER RESPIRATORY MANIFESTATIONS (ICD-487.1) resolved.   Problem # 3:  ALLERGIC RHINITIS (ICD-477.9) stable. he might need allegra like medicine once pollon season starts.  His updated medication list for this problem includes:    Nasonex 50 Mcg/act Susp (Mometasone furoate) ..... One puff in each nostril once daily  Problem # 4:  HYPERLIPIDEMIA (ICD-272.4)  his shoulder pain is independent of statin. continued on statin for now. we will recheck his lipids and lft on next morning visit.  His updated medication list for this problem includes:    Pravastatin Sodium 40 Mg Tabs (Pravastatin sodium) .Marland Kitchen... Take 1 tablet by mouth once a day  Labs Reviewed: SGOT: 33 (10/25/2008)   SGPT: 45 (10/25/2008)   HDL:65 (02/16/2009), 56 (11/08/2008)  LDL:137 (02/16/2009), 166 (11/08/2008)  Chol:239 (02/16/2009), 263 (11/08/2008)  Trig:185 (02/16/2009), 207 (11/08/2008)  Future Orders: T-Lipid Profile ([id number]) ...  12/01/2009 T-Hepatic Function [id number]) ... 12/01/2009  Problem # 5:  SHOULDER PAIN, LEFT (ICD-719.41) referred to sports medicine.  Orders: Sports Medicine (Sports Med)  Complete Medication List: 1)  Viagra 100 Mg Tabs (Sildenafil citrate) .... .qdtab 2)  Nexium 40 Mg Cpdr (Esomeprazole magnesium) .... Take 1 tablet by mouth once a day 3)  Spiriva Handihaler 18 Mcg Caps (Tiotropium bromide monohydrate) .... Inhale 1 puff once daily. 4)  Albuterol 90 Mcg/act Aers (Albuterol) .... Inhale 1-2 puffs every 4 hours as needed for shortness of breath. 5)  Maxzide-25 37.5-25 Mg Tabs (Triamterene-hctz) .... Take 1 tablet by mouth once a day 6)  Clobetasol Propionate 0.05 % Oint (Clobetasol propionate) .... Apply to the itchy areas on legs twice a day for seven days. 7)  Atenolol 25 Mg Tabs (Atenolol) .... Take 1 tablet by mouth once a day 8)  Nasonex 50 Mcg/act Susp (Mometasone furoate) .... One puff in each nostril once daily 9)  Pravastatin Sodium 40 Mg Tabs (Pravastatin sodium) .... Take 1 tablet  by mouth once a day  Patient Instructions: 1)  Please schedule a follow-up appointment in 3 months. Prescriptions: PRAVASTATIN SODIUM 40 MG TABS (PRAVASTATIN SODIUM) Take 1 tablet by mouth once a day  #30 x 3   Entered and Authorized by:   Clerance Lav MD   Signed by:   Clerance Lav MD on 10/16/2009   Method used:   Faxed to ...       [facility] Department (retail)       [address] [place], [place]  [address]       Ph: [phone]       Fax: [phone]   RxID:   [id number] MAXZIDE-25 37.5-25 MG  TABS (TRIAMTERENE-HCTZ) Take 1 tablet by mouth once a day  #31 x 5   Entered and Authorized by:   Clerance Lav MD   Signed by:   Clerance Lav MD on 10/16/2009   Method used:   Faxed to ...       [facility] Department (retail)       [address] [place], [place]  [address]       Ph: [phone]       Fax: [phone]   RxID:    [id number] NEXIUM 40 MG  CPDR (ESOMEPRAZOLE MAGNESIUM) Take 1 tablet by mouth once a day  #31 x 5   Entered and Authorized by:   Clerance Lav MD   Signed by:   Clerance Lav MD on 10/16/2009   Method used:   Faxed to ...       [facility] Department (retail)       [address] [place], [place]  [address]       Ph: [phone]       Fax: [phone]   RxID:   [id number] VIAGRA 100 MG TABS (SILDENAFIL CITRATE) .qdtab  #30 x 0   Entered and Authorized by:   Clerance Lav MD   Signed by:   Clerance Lav MD on 10/16/2009   Method used:   Faxed to ...       [facility] Department (retail)       [address] [place], [place]  [address]       Ph: [phone]       Fax: [phone]   RxID:   [id number]   Prevention & Chronic Care Immunizations   Influenza vaccine: Fluvax Non-MCR  (06/16/2007)   Influenza vaccine deferral: Refused  (10/09/2009)    Tetanus booster: Not documented   Td booster deferral: Refused  (10/16/2009)    Pneumococcal vaccine: Not documented  Colorectal Screening   Hemoccult: Negative  (02/08/2005)    Colonoscopy: Not documented   Colonoscopy action/deferral: Refused  (10/16/2009)  Other Screening   PSA: Not documented   PSA action/deferral: Discussed-PSA requested  (10/16/2009)   Smoking status: current  (10/16/2009)   Smoking cessation counseling: yes  (10/16/2009)  Lipids   Total Cholesterol: 239  (02/16/2009)   LDL: 137  (02/16/2009)   LDL Direct: Not documented   HDL: 65  (02/16/2009)   Triglycerides: 185  (02/16/2009)    SGOT (AST): 33  (10/25/2008)   SGPT (ALT): 45  (10/25/2008)   Alkaline phosphatase: 75  (10/25/2008)   Total bilirubin: 0.4  (10/25/2008)    Lipid flowsheet reviewed?: Yes   Progress toward LDL goal: Unchanged  Hypertension  Last Blood Pressure: 117 / 75  (10/16/2009)   Serum creatinine: 0.89  (11/08/2008)   Serum potassium 4.1  (11/08/2008)    Hypertension  flowsheet reviewed?: Yes   Progress toward BP goal: At goal  Self-Management Support :   Personal Goals (by the next clinic visit) :      Personal blood pressure goal: 140/90  (10/09/2009)     Personal LDL goal: 100  (10/09/2009)    Patient will work on the following items until the next clinic visit to reach self-care goals:     Medications and monitoring: take my medicines every day, bring all of my medications to every visit  (10/16/2009)     Eating: drink diet soda or water instead of juice or soda, eat more vegetables, eat foods that are low in salt, eat baked foods instead of fried foods  (10/16/2009)     Activity: take a 30 minute walk every day  (10/16/2009)    Hypertension self-management support: Not documented    Lipid self-management support: Not documented   Process Orders Check Orders Results:     [facility]: ABN not required for this insurance Tests Sent for requisitioning (October 16, 2009 4:55 PM):     12/01/2009: [facility] -- T-Lipid Profile [id number] (signed)     12/01/2009: [facility] -- T-Hepatic Function [id number] (signed)    Appended Document: est-ck/fu/meds/cfb    Clinical Lists Changes  Orders: Added new Service order of Est. Patient Level IV ([id number]) - Signed

## 2010-10-02 NOTE — Progress Notes (Signed)
Summary: refill/gg  Phone Note Refill Request  on April 20, 2010 3:16 PM  Refills Requested: Medication #1:  NEXIUM 40 MG  CPDR Take 1 tablet by mouth once a day   Last Refilled: 01/24/2010  Method Requested: Fax to Local Pharmacy Initial call taken by: Merrie Roof RN,  April 20, 2010 3:16 PM  Follow-up for Phone Call        Has Oct appt. Follow-up by: Blanch Media MD,  April 20, 2010 3:23 PM    Prescriptions: NEXIUM 40 MG  CPDR (ESOMEPRAZOLE MAGNESIUM) Take 1 tablet by mouth once a day  #31 x 5   Entered and Authorized by:   Blanch Media MD   Signed by:   Blanch Media MD on 04/20/2010   Method used:   Faxed to ...       [facility] Department (retail)       [address] [place], [place]  [address]       Ph: [phone]       Fax: [phone]   RxID:   [id number]

## 2010-10-02 NOTE — Progress Notes (Signed)
Summary: med refill/gp  Phone Note Refill Request Message from:  Fax from Pharmacy on Jan 30, 2010 9:49 AM  Refills Requested: Medication #1:  VIAGRA 100 MG TABS .qdtab   Last Refilled: 11/18/2009 Last appt.12/14/09   Method Requested: Telephone to Pharmacy Initial call taken by: Chinita Pester RN,  Jan 30, 2010 9:50 AM  Follow-up for Phone Call       Follow-up by: Clerance Lav MD,  January 31, 2010 7:50 PM    Prescriptions: VIAGRA 100 MG TABS (SILDENAFIL CITRATE) .qdtab  #30 x 0   Entered and Authorized by:   Clerance Lav MD   Signed by:   Clerance Lav MD on 01/31/2010   Method used:   Faxed to ...       [facility] Department (retail)       [address] [place], [place]  [address]       Ph: [phone]       Fax: [phone]   RxID:   [id number]

## 2010-10-02 NOTE — Progress Notes (Signed)
  Phone Note Outgoing Call   Summary of Call: Called patient but no answer at phone number.  Will try again.    05/21/10  Sent letter to patient home to call social work to schedule smoking cessation counseling.

## 2010-10-02 NOTE — Progress Notes (Signed)
Summary: phone/gg  Phone Note Call from Patient   Caller: Patient Summary of Call: Pt called and states has SOB, sore throat, non-productive cough, fever ( 101 ) , onset 5 days ago. Pt is able to eat and drink.  Will see this AM Initial call taken by: Merrie Roof RN,  October 09, 2009 10:01 AM

## 2010-10-02 NOTE — Assessment & Plan Note (Signed)
Summary: acute-high blood pressure/cfb   Vital Signs:  Patient profile:   55 year old male Height:      63 inches (160.02 cm) Weight:      121.6 pounds (56.82 kg) BMI:     21.62 Temp:     97.0 degrees F (36.11 degrees C) oral Pulse rate:   76 / minute BP sitting:   138 / 92  (right arm) Cuff size:   regular  Vitals Entered By: Theotis Barrio NT II (December 14, 2009 9:40 AM) CC: STATES HE IS HERE BECAUSE OF HIS BP BEING HIGH / CHEST CONGESTION V- RUNNY NOSE FOR ABOUTA WEEK- PRODUCTIVE COUGH-GREENISH IN COLOR. Is Patient Diabetic? No Pain Assessment Patient in pain? yes     Location: chest Intensity: 7 Type: SORENESS Onset of pain  FOR ABOUT A WEEK ( SORENESS FROM COUGHING) Nutritional Status BMI of 19 -24 = normal  Have you ever been in a relationship where you felt threatened, hurt or afraid?No   Does patient need assistance? Functional Status Self care Ambulation Normal Comments CHEST CONGRESTION - RUNNY NOSE  - PRODUCTIVE COUGH FOR ABOUT A WEEK - GREENISH IN COLOR.  CHEST AREA SORE FROM THE COUGHING.   Primary Care Provider:  Clerance Lav MD  CC:  STATES HE IS HERE BECAUSE OF HIS BP BEING HIGH / CHEST CONGESTION V- RUNNY NOSE FOR ABOUTA WEEK- PRODUCTIVE COUGH-GREENISH IN COLOR.Marland Kitchen  History of Present Illness: 55 yr old male with HTN, COPD presents for cough, congestion and shortness of brath for one week. it worsened acutely 2-3 days ago and since have been stable. he report sno fevers, sick contacts or increased use of inhalers. He is coughing and bringing up green color sputum. He has some sinus pressure. He was also surprised to find that he had elevated bloodpressure. He is taking 2 over the counter cough and cold medicines which both have decongestant in the them.   Preventive Screening-Counseling & Management  Alcohol-Tobacco     Alcohol drinks/day: 4+     Alcohol type: beer     Smoking Status: current     Smoking Cessation Counseling: yes     Smoke Cessation  Stage: contemplative     Packs/Day: 0.5     Year Started: 1970's  Caffeine-Diet-Exercise     Does Patient Exercise: yes     Type of exercise: walking     Times/week: 5  Allergies (verified): 1)  ! Aspirin (Aspirin) 2)  ! 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  Sister had MI at 2-35.  Social History: Last updated: 05/12/2007 Single. Smokes 1-2 ppd. Drinks anywhere from 3 to 12 beers daily.  No illicit drug use.  Risk Factors: Alcohol Use: 4+ (12/14/2009) Exercise: yes (12/14/2009)  Risk Factors: Smoking Status: current (12/14/2009) Packs/Day: 0.5 (12/14/2009)  Review of Systems      See HPI  Physical Exam  General:  Well-developed,well-nourished,in no acute distress; alert,appropriate and cooperative throughout examination Head:  normocephalic and atraumatic.   Eyes:  vision grossly intact, pupils equal, pupils round, and pupils reactive to light.   Ears:  L ear normal and R cerumen impaction.   Nose:  nasal turbinates inflamed  Mouth:  no exudates, pharynx pink and moist and poor dentition.   Neck:  supple, full ROM, and no masses.  Lungs:  normal respiratory effort, no intercostal retractions, no accessory muscle use, rhonchi, noisy chest with occassional wheezing.  Heart:  normal rate, regular rhythm, no murmur, no gallop, and no rub.   Abdomen:  soft, non-tender, and normal bowel sounds.   Msk:  Unchanged from last office visit with exception of significantly decreased pain on all provacative maneuvers. Normal neurovascular examination. Neurologic:  sensation intact Psych:  Cognition and judgment appear intact. Alert and cooperative with normal attention span and concentration. No apparent delusions, illusions,  hallucinations   Impression & Recommendations:  Problem # 1:  BRONCHITIS, ACUTE (ICD-466.0) This is COPD exac vs. bronchitis. I will treat it with antibiotic nonetheless as it is >1 wk with productive cough and not improving on its own.  His updated medication list for this problem includes:    Spiriva Handihaler 18 Mcg Caps (Tiotropium bromide monohydrate) ..... Inhale 1 puff once daily.    Albuterol 90 Mcg/act Aers (Albuterol) ..... Inhale 1-2 puffs every 4 hours as needed for shortness of breath.    Doxycycline Hyclate 100 Mg Caps (Doxycycline hyclate) .Marland Kitchen... Take 1 tablet by mouth two times a day  Problem # 2:  COPD (ICD-496) see above. At this time will not start any steroid. Wheezing is not promient so did not advise any nebulisation.  His updated medication list for this problem includes:    Spiriva Handihaler 18 Mcg Caps (Tiotropium bromide monohydrate) ..... Inhale 1 puff once daily.    Albuterol 90 Mcg/act Aers (Albuterol) ..... Inhale 1-2 puffs every 4 hours as needed for shortness of breath.  Problem # 3:  HYPERLIPIDEMIA (ICD-272.4) He needs LFT and lipid profile to be repeated which will be set up for next week.  His updated medication list for this problem includes:    Pravastatin Sodium 40 Mg Tabs (Pravastatin sodium) .Marland Kitchen... Take 1 tablet by mouth once a day  Future Orders: T-Lipid Profile ([id number]) ... 12/20/2009  Labs Reviewed: SGOT: 33 (10/25/2008)   SGPT: 45 (10/25/2008)   HDL:65 (02/16/2009), 56 (11/08/2008)  LDL:137 (02/16/2009), 166 (11/08/2008)  Chol:239 (02/16/2009), 263 (11/08/2008)  Trig:185 (02/16/2009), 207 (11/08/2008)  Problem # 4:  HYPERTENSION, BENIGN ESSENTIAL (ICD-401.1) BP well controlled. No changes made.  His updated medication list for this problem includes:    Maxzide-25 37.5-25 Mg Tabs (Triamterene-hctz) .Marland Kitchen... Take 1 tablet by mouth once a day    Atenolol 25 Mg Tabs (Atenolol) .Marland Kitchen... Take 1 tablet by mouth once a day  BP today:  138/92 Prior BP: 152/93 (11/22/2009)  Labs Reviewed: K+: 4.1 (11/08/2008) Creat: : 0.89 (11/08/2008)   Chol: 239 (02/16/2009)   HDL: 65 (02/16/2009)   LDL: 137 (02/16/2009)   TG: 185 (02/16/2009)  Problem # 5:  SHOULDER PAIN, LEFT (ICD-719.41) stable. No changes made.  His updated medication list for this problem includes:    Diclofenac Sodium 75 Mg Tbec (Diclofenac sodium) ..... One tab once daily with food  Complete Medication List: 1)  Viagra 100 Mg Tabs (Sildenafil citrate) .... .qdtab 2)  Nexium 40 Mg Cpdr (Esomeprazole magnesium) .... Take 1 tablet by mouth once a day 3)  Spiriva Handihaler 18 Mcg Caps (Tiotropium bromide monohydrate) .... Inhale 1 puff once daily. 4)  Albuterol 90 Mcg/act Aers (Albuterol) .... Inhale 1-2 puffs every 4 hours as needed for shortness of breath. 5)  Maxzide-25 37.5-25 Mg Tabs (Triamterene-hctz) .... Take 1 tablet by mouth once a day 6)  Clobetasol Propionate 0.05 % Oint (Clobetasol propionate) .... Apply to the itchy areas on legs twice  a day for seven days. 7)  Atenolol 25 Mg Tabs (Atenolol) .... Take 1 tablet by mouth once a day 8)  Nasonex 50 Mcg/act Susp (Mometasone furoate) .... One puff in each nostril once daily 9)  Pravastatin Sodium 40 Mg Tabs (Pravastatin sodium) .... Take 1 tablet by mouth once a day 10)  Diclofenac Sodium 75 Mg Tbec (Diclofenac sodium) .... One tab once daily with food 11)  Doxycycline Hyclate 100 Mg Caps (Doxycycline hyclate) .... Take 1 tablet by mouth two times a day  Other Orders: Future Orders: T-Hepatic Function ([id number]) ... 12/20/2009  Patient Instructions: 1)  Please schedule a follow-up appointment in 1 month. Prescriptions: DOXYCYCLINE HYCLATE 100 MG CAPS (DOXYCYCLINE HYCLATE) Take 1 tablet by mouth two times a day  #20 x 0   Entered and Authorized by:   Clerance Lav MD   Signed by:   Clerance Lav MD on 12/14/2009   Method used:   Faxed to ...       [facility] (retail)        [address] [place], [place]  [address]       Ph: [phone]       Fax: [phone]   RxID:   [id number]  Process Orders Check Orders Results:     [facility]: ABN not required for this insurance Tests Sent for requisitioning (December 14, 2009 2:50 PM):     12/20/2009: [facility] -- T-Lipid Profile [id number] (signed)     12/20/2009: [facility] -- T-Hepatic Function [id number] (signed)    Prevention & Chronic Care Immunizations   Influenza vaccine: Fluvax Non-MCR  (06/16/2007)   Influenza vaccine deferral: Refused  (10/09/2009)    Tetanus booster: Not documented   Td booster deferral: Refused  (10/16/2009)    Pneumococcal vaccine: Not documented  Colorectal Screening   Hemoccult: Negative  (02/08/2005)   Hemoccult action/deferral: Refused  (12/14/2009)    Colonoscopy: Not documented   Colonoscopy action/deferral: Refused  (10/16/2009)  Other Screening   PSA: Not documented   PSA action/deferral: Discussed-PSA requested  (10/16/2009)   Smoking status: current  (12/14/2009)   Smoking cessation counseling: yes  (12/14/2009)  Lipids   Total Cholesterol: 239  (02/16/2009)   Lipid panel action/deferral: Lipid Panel ordered   LDL: 137  (02/16/2009)   LDL Direct: Not documented   HDL: 65  (02/16/2009)   Triglycerides: 185  (02/16/2009)    SGOT (AST): 33  (10/25/2008)   BMP action: Ordered   SGPT (ALT): 45  (10/25/2008)   Alkaline phosphatase: 75  (10/25/2008)   Total bilirubin: 0.4  (10/25/2008)    Lipid flowsheet reviewed?: Yes   Progress toward LDL goal: At goal  Hypertension   Last Blood Pressure: 138 / 92  (12/14/2009)   Serum creatinine: 0.89  (11/08/2008)   Serum potassium 4.1  (11/08/2008)    Hypertension flowsheet reviewed?: Yes   Progress toward BP goal: Improved  Self-Management Support :   Personal Goals (by the next clinic visit) :      Personal blood pressure goal: 140/90   (10/09/2009)     Personal LDL goal: 100  (10/09/2009)    Patient will work on the following items until the next clinic visit to reach self-care goals:     Medications and monitoring: take my medicines every day, check my blood pressure  (12/14/2009)     Eating: eat more vegetables, use fresh or frozen vegetables, eat fruit for snacks  and desserts, limit or avoid alcohol  (12/14/2009)     Activity: park at the far end of the parking lot  (12/14/2009)    Hypertension self-management support: Resources for patients handout, Written self-care plan  (12/14/2009)   Hypertension self-care plan printed.    Lipid self-management support: Resources for patients handout, Written self-care plan  (12/14/2009)   Lipid self-care plan printed.      Resource handout printed.

## 2010-10-02 NOTE — Letter (Signed)
Summary: Soc. Work  [facility]  [address]   [place], [place] [address]   Phone: [phone]  Fax: [phone]    05/21/2010       Samuel Chen [address] [place], [place]  [address]  Dear [name],  Your doctor informed me that you were interested in trying to quit smoking. Please call me at [phone] to schedule an appointment to meet with me for a session of smoking cessation counseling to help motivate you in your quest to get cigarettes out of your life.   I look forward to working with you.   Sincerely,   Dorothe Pea, LCSW [facility] Smoking Cessation Counselor

## 2010-10-02 NOTE — Letter (Signed)
Summary: Pre Visit Letter Revised  [facility]  [address] [place], [place] [address]   Phone: [phone]  Fax: [phone]        07/10/2010 MRN: [id number]  Samuel Chen [address] [place], [place]  [address]             Procedure Date:  12-21 at 10:30am           Dr Jarold Motto              Welcome to the Gastroenterology Division at [facility].    You are scheduled to see a nurse for your pre-procedure visit on 08-08-10 at 10:30am on the 3rd floor at [facility], [address]  We ask that you try to arrive at our office 15 minutes prior to your appointment time to allow for check-in.  Please take a minute to review the attached form.  If you answer "Yes" to one or more of the questions on the first page, we ask that you call the person listed at your earliest opportunity.  If you answer "No" to all of the questions, please complete the rest of the form and bring it to your appointment.    Your nurse visit will consist of discussing your medical and surgical history, your immediate family medical history, and your medications.   If you are unable to list all of your medications on the form, please bring the medication bottles to your appointment and we will list them.  We will need to be aware of both prescribed and over the counter drugs.  We will need to know exact dosage information as well.    Please be prepared to read and sign documents such as consent forms, a financial agreement, and acknowledgement forms.  If necessary, and with your consent, a friend or relative is welcome to sit-in on the nurse visit with you.  Please bring your insurance card so that we may make a copy of it.  If your insurance requires a referral to see a specialist, please bring your referral form from your primary care physician.  No co-pay is required for this nurse visit.     If you cannot keep your appointment, please call [phone] to cancel or reschedule  prior to your appointment date.  This allows [place] the opportunity to schedule an appointment for another patient in need of care.    Thank you for choosing [facility] for your medical needs.  We appreciate the opportunity to care for you.  Please visit [place] at our website  to learn more about our practice.  Sincerely, The Gastroenterology Division

## 2010-10-02 NOTE — Progress Notes (Signed)
Summary: refill/ hla  Phone Note Refill Request Message from:  Fax from Pharmacy on March 09, 2010 4:52 PM  Refills Requested: Medication #1:  PRAVASTATIN SODIUM 40 MG TABS Take 1 tablet by mouth once a day   Last Refilled: 6/7 Initial call taken by: Marin Roberts RN,  March 09, 2010 4:53 PM  Follow-up for Phone Call       Follow-up by: Clerance Lav MD,  March 09, 2010 7:54 PM    Prescriptions: PRAVASTATIN SODIUM 40 MG TABS (PRAVASTATIN SODIUM) Take 1 tablet by mouth once a day  #30 x 3   Entered and Authorized by:   Clerance Lav MD   Signed by:   Clerance Lav MD on 03/09/2010   Method used:   Electronically to        [facility] [address] (retail)       [phone]       [place], [place]  [address]       Ph: [phone]       Fax: [phone]   RxID:   [id number]

## 2010-10-03 ENCOUNTER — Other Ambulatory Visit: Payer: Self-pay | Admitting: *Deleted

## 2010-10-04 NOTE — Assessment & Plan Note (Addendum)
Summary: ACUTE-RESCH FOR MEDICATION REFILLS/CFB(SHAH)   Vital Signs:  Patient profile:   55 year old male Height:      63 inches Weight:      133.4 pounds BMI:     23.72 Temp:     98.0 degrees F oral Pulse rate:   83 / minute BP sitting:   127 / 82  (right arm)  Vitals Entered By: Filomena Jungling NT II (September 28, 2010 10:38 AM) CC: NEED REFILLS-  Is Patient Diabetic? No Pain Assessment Patient in pain? no      Nutritional Status BMI of 19 -24 = normal  Have you ever been in a relationship where you felt threatened, hurt or afraid?No   Does patient need assistance? Functional Status Self care Ambulation Normal   Primary Care Provider:  Clerance Lav MD  CC:  NEED REFILLS- .  History of Present Illness: 55 y/o m with GERD, HTN, HLD comes for refill  no new complaints   Preventive Screening-Counseling & Management  Alcohol-Tobacco     Alcohol drinks/day: 4+     Alcohol type: beer     Smoking Status: current     Smoking Cessation Counseling: yes     Smoke Cessation Stage: contemplative     Packs/Day: 1.5     Year Started: 1970's  Caffeine-Diet-Exercise     Does Patient Exercise: yes     Type of exercise: walking     Times/week: 5  Current Medications (verified): 1)  Viagra 100 Mg Tabs (Sildenafil Citrate) .... .qdtab 2)  Nexium 40 Mg  Cpdr (Esomeprazole Magnesium) .... Take 1 Tablet By Mouth Once A Day 3)  Spiriva Handihaler 18 Mcg Caps (Tiotropium Bromide Monohydrate) .... Inhale 1 Puff Once Daily. 4)  Albuterol 90 Mcg/act Aers (Albuterol) .... Inhale 1-2 Puffs Every 4 Hours As Needed For Shortness of Breath. 5)  Maxzide-25 37.5-25 Mg  Tabs (Triamterene-Hctz) .... Take 1 Tablet By Mouth Once A Day 6)  Atenolol 25 Mg Tabs (Atenolol) .... Take 1 Tablet By Mouth Once A Day 7)  Nasonex 50 Mcg/act Susp (Mometasone Furoate) .... One Puff in Each Nostril Once Daily 8)  Pravastatin Sodium 40 Mg Tabs (Pravastatin Sodium) .... Take 1 Tablet By Mouth Once A Day 9)   Diclofenac Sodium 75 Mg Tbec (Diclofenac Sodium) .... One Tab Once Daily With Food  Allergies (verified): 1)  ! Aspirin (Aspirin) 2)  ! Tylenol  Review of Systems  The patient denies anorexia, fever, weight loss, weight gain, vision loss, decreased hearing, hoarseness, chest pain, syncope, dyspnea on exertion, peripheral edema, prolonged cough, headaches, hemoptysis, abdominal pain, melena, hematochezia, severe indigestion/heartburn, hematuria, incontinence, genital sores, muscle weakness, suspicious skin lesions, transient blindness, difficulty walking, depression, unusual weight change, abnormal bleeding, enlarged lymph nodes, angioedema, breast masses, and testicular masses.    Physical Exam  General:  Gen: VS reveiwed, Alert, well developed, nodistress ENT: mucous membranes pink & moist. No abnormal finds in ear and nose. CVC:S1 S2 , no murmurs, no abnormal heart sounds. Lungs: Clear to auscultation B/L. No wheezes, crackles or other abnormal sounds Abdomen: soft, non distended, no tender. Normal Bowel sounds EXT: no pitting edema, no engorged veins, Pulsations normal  Neuro:alert, oriented *3, cranial nerved 2-12 intact, strenght normal in all  extremities, senstations normal to light touch.      Impression & Recommendations:  Problem # 1:  HYPERTENSION, BENIGN ESSENTIAL (ICD-401.1) well controlled  His updated medication list for this problem includes:    Maxzide-25 37.5-25 Mg Tabs (Triamterene-hctz) .Marland KitchenMarland KitchenMarland KitchenMarland Kitchen  Take 1 tablet by mouth once a day    Atenolol 25 Mg Tabs (Atenolol) .Marland Kitchen... Take 1 tablet by mouth once a day  BP today: 127/82 Prior BP: 138/86 (06/22/2010)  Labs Reviewed: K+: 3.7 (03/27/2010) Creat: : 0.85 (03/27/2010)   Chol: 197 (06/25/2010)   HDL: 49 (06/25/2010)   LDL: 73 (06/25/2010)   TG: 376 (06/25/2010)  Problem # 2:  GERD (ICD-530.81) nexium is helping  His updated medication list for this problem includes:    Nexium 40 Mg Cpdr (Esomeprazole magnesium)  .Marland Kitchen... Take 1 tablet by mouth once a day  Problem # 3:  TOBACCO ABUSE (ICD-305.1)  counselled will give chantix prescription  His updated medication list for this problem includes:    Chantix Starting Month Pak 0.5 Mg X 11 & 1 Mg X 42 Tabs (Varenicline tartrate) .Marland Kitchen... As directed    Chantix Continuing Month Pak 1 Mg Tabs (Varenicline tartrate) .Marland Kitchen... As directed  Complete Medication List: 1)  Viagra 100 Mg Tabs (Sildenafil citrate) .... .qdtab 2)  Nexium 40 Mg Cpdr (Esomeprazole magnesium) .... Take 1 tablet by mouth once a day 3)  Spiriva Handihaler 18 Mcg Caps (Tiotropium bromide monohydrate) .... Inhale 1 puff once daily. 4)  Albuterol 90 Mcg/act Aers (Albuterol) .... Inhale 1-2 puffs every 4 hours as needed for shortness of breath. 5)  Maxzide-25 37.5-25 Mg Tabs (Triamterene-hctz) .... Take 1 tablet by mouth once a day 6)  Atenolol 25 Mg Tabs (Atenolol) .... Take 1 tablet by mouth once a day 7)  Nasonex 50 Mcg/act Susp (Mometasone furoate) .... One puff in each nostril once daily 8)  Pravastatin Sodium 40 Mg Tabs (Pravastatin sodium) .... Take 1 tablet by mouth once a day 9)  Diclofenac Sodium 75 Mg Tbec (Diclofenac sodium) .... One tab once daily with food 10)  Chantix Starting Month Pak 0.5 Mg X 11 & 1 Mg X 42 Tabs (Varenicline tartrate) .... As directed 11)  Chantix Continuing Month Pak 1 Mg Tabs (Varenicline tartrate) .... As directed  Patient Instructions: 1)  Please schedule a follow-up appointment in 5-6 months with pcp. 2)  Stop Smoking Tips: Choose a Quit date. Cut down before the Quit date. decide what you will do as a substitute when you feel the urge to smoke(gum,toothpick,exercise). Prescriptions: CHANTIX STARTING MONTH PAK 0.5 MG X 11 & 1 MG X 42 TABS (VARENICLINE TARTRATE) as directed  #1 x 0   Entered and Authorized by:   Bethel Born MD   Signed by:   Bethel Born MD on 09/28/2010   Method used:   Faxed to ...       [facility] Department (retail)        [address] [place], [place]  [address]       Ph: [phone]       Fax: [phone]   RxID:   [id number] CHANTIX CONTINUING MONTH PAK 1 MG TABS (VARENICLINE TARTRATE) as directed  #1 x 2   Entered and Authorized by:   Bethel Born MD   Signed by:   Bethel Born MD on 09/28/2010   Method used:   Faxed to ...       [facility] Department (retail)       [address] [place], [place]  [address]       Ph: [phone]       Fax: [phone]   RxID:   [id number] PRAVASTATIN SODIUM 40  MG TABS (PRAVASTATIN SODIUM) Take 1 tablet by mouth once a day  #31 x 6   Entered and Authorized by:   Bethel Born MD   Signed by:   Bethel Born MD on 09/28/2010   Method used:   Faxed to ...       [facility] Department (retail)       [address] [place], [place]  [address]       Ph: [phone]       Fax: [phone]   RxID:   [id number] NASONEX 50 MCG/ACT SUSP (MOMETASONE FUROATE) One puff in each nostril once daily  #1 x 10   Entered and Authorized by:   Bethel Born MD   Signed by:   Bethel Born MD on 09/28/2010   Method used:   Faxed to ...       [facility] Department (retail)       [address] [place], [place]  [address]       Ph: [phone]       Fax: [phone]   RxID:   [id number] ATENOLOL 25 MG TABS (ATENOLOL) Take 1 tablet by mouth once a day  #31 x 6   Entered and Authorized by:   Bethel Born MD   Signed by:   Bethel Born MD on 09/28/2010   Method used:   Faxed to ...       [facility] Department (retail)       [address] [place], [place]  [address]       Ph: [phone]       Fax: [phone]   RxID:   [id number] MAXZIDE-25 37.5-25 MG  TABS (TRIAMTERENE-HCTZ) Take 1 tablet by mouth once a day  #31 x 6   Entered and Authorized by:   Bethel Born MD   Signed by:   Bethel Born MD on 09/28/2010   Method used:   Faxed to ...       [facility] Department (retail)       [address] [place], [place]  [address]       Ph: [phone]       Fax: [phone]   RxID:   [id number] ALBUTEROL 90 MCG/ACT AERS (ALBUTEROL) Inhale 1-2 puffs every 4 hours as needed for shortness of breath.  #1 x 12   Entered and Authorized by:   Bethel Born MD   Signed by:   Bethel Born MD on 09/28/2010   Method used:   Faxed to ...       [facility] Department (retail)       [address] [place], [place]  [address]       Ph: [phone]       Fax: [phone]   RxID:   [id number] SPIRIVA HANDIHALER 18 MCG CAPS (TIOTROPIUM BROMIDE MONOHYDRATE) Inhale 1 puff once daily.  #1 x 12   Entered and Authorized by:   Bethel Born MD   Signed by:   Bethel Born MD on 09/28/2010   Method used:   Faxed to ...       [facility] Department (retail)       [address] [place], [place]  [address]       Ph: [phone]  Fax: [phone]   RxID:   [id number] NEXIUM 40 MG  CPDR (ESOMEPRAZOLE MAGNESIUM) Take 1 tablet by mouth once a day  #31 x 6   Entered and Authorized by:   Bethel Born MD   Signed by:   Bethel Born MD on 09/28/2010   Method used:   Faxed to ...       [facility] Department (retail)       [address] [place], [place]  [address]       Ph: [phone]       Fax: [phone]   RxID:   [id number]    Orders Added: 1)  Est. Patient Level III [[address]]    Prevention & Chronic Care Immunizations   Influenza vaccine: Fluvax Non-MCR  (06/22/2010)   Influenza vaccine deferral: Deferred  (03/27/2010)    Tetanus booster: 03/27/2010: Tdap   Td booster deferral: Refused  (10/16/2009)   Tetanus booster due: 06/03/2015    Pneumococcal vaccine: Pneumovax  (03/27/2010)   Pneumococcal vaccine due: 06/03/2015  Colorectal Screening   Hemoccult: Negative  (02/08/2005)   Hemoccult action/deferral: Refused  (12/14/2009)    Colonoscopy: DONE   (08/22/2010)   Colonoscopy action/deferral: GI referral  (03/27/2010)   Colonoscopy due: 08/2020  Other Screening   PSA: Not documented   PSA action/deferral: Discussion deferred  (03/27/2010)   Smoking status: current  (09/28/2010)   Smoking cessation counseling: yes  (09/28/2010)  Lipids   Total Cholesterol: 197  (06/25/2010)   Lipid panel action/deferral: Lipid Panel ordered   LDL: 73  (06/25/2010)   LDL Direct: Not documented   HDL: 49  (06/25/2010)   Triglycerides: 376  (06/25/2010)    SGOT (AST): 43  (04/30/2010)   BMP action: Ordered   SGPT (ALT): 52  (04/30/2010)   Alkaline phosphatase: 62  (04/30/2010)   Total bilirubin: 0.4  (04/30/2010)  Hypertension   Last Blood Pressure: 127 / 82  (09/28/2010)   Serum creatinine: 0.85  (03/27/2010)   Serum potassium 3.7  (03/27/2010)  Self-Management Support :   Personal Goals (by the next clinic visit) :      Personal blood pressure goal: 140/90  (10/09/2009)     Personal LDL goal: 100  (10/09/2009)    Patient will work on the following items until the next clinic visit to reach self-care goals:     Medications and monitoring: take my medicines every day  (09/28/2010)     Eating: eat more vegetables, use fresh or frozen vegetables, eat foods that are low in salt, eat baked foods instead of fried foods, eat fruit for snacks and desserts  (09/28/2010)     Activity: take a 30 minute walk every day  (06/22/2010)    Hypertension self-management support: Animator, Resources for patients handout, Written self-care plan  (06/22/2010)    Lipid self-management support: Education handout, Resources for patients handout, Written self-care plan  (06/22/2010)    Appended Document: ACUTE-RESCH FOR MEDICATION REFILLS/CFB(SHAH) pravastatin faxed into [facility] because [facility] does not supply.

## 2010-10-04 NOTE — Procedures (Signed)
Summary: Colonoscopy  Patient: [name] Note: All result statuses are Final unless otherwise noted.  Tests: (1) Colonoscopy (COL)   COL Colonoscopy           DONE     [facility]     [address]     [place], [place]  [address]           COLONOSCOPY PROCEDURE REPORT           PATIENT:  Samuel Chen, Samuel Chen  MR#:  [id number]     BIRTHDATE:  11/21/1955, 54 yrs. old  GENDER:  male     ENDOSCOPIST:  Vania Rea. Jarold Motto, MD, [facility]     REF. BY:     PROCEDURE DATE:  08/22/2010     PROCEDURE:  Average-risk screening colonoscopy     G0121, Colonoscopy with snare polypectomy     ASA CLASS:  Class II     INDICATIONS:  Routine Risk Screening     MEDICATIONS:   Fentanyl 50 mcg IV, Versed 5 mg IV           DESCRIPTION OF PROCEDURE:   After the risks benefits and     alternatives of the procedure were thoroughly explained, informed     consent was obtained.  Digital rectal exam was performed and     revealed perianal skin tags.   The [id number] endoscope was     introduced through the anus and advanced to the cecum, which was     identified by both the appendix and ileocecal valve, without     limitations.  The quality of the prep was excellent, using     MoviPrep.  The instrument was then slowly withdrawn as the colon     was fully examined.     <<PROCEDUREIMAGES>>           FINDINGS:  A sessile polyp was found in the sigmoid colon.     FLAT SIGMOID POLYP HOT SNARE EXCISED.SEE PICTURE.  This was     otherwise a normal examination of the colon.   Retroflexed views     in the rectum revealed hypertrophied anal papillae.    The scope     was then withdrawn from the patient and the procedure completed.           COMPLICATIONS:  None     ENDOSCOPIC IMPRESSION:     1) Sessile polyp in the sigmoid colon     2) Otherwise normal examination     3) Hypertrophied anal papillae     RECOMMENDATIONS:     1) Repeat colonoscopy in 5 years if polyp adenomatous; otherwise     10  years     REPEAT EXAM:  No           ______________________________     Vania Rea. Jarold Motto, MD, [facility] Graham           CC:           n.     eSIGNED:   Vania Rea. Patterson at 08/22/2010 11:20 AM           Matheny, Aneta Mins, [id number]  Note: An exclamation mark (!) indicates a result that was not dispersed into the flowsheet. Document Creation Date: 08/22/2010 11:20 AM _______________________________________________________________________  (1) Order result status: Final Collection or observation date-time: 08/22/2010 11:15 Requested date-time:  Receipt date-time:  Reported date-time:  Referring Physician:   Ordering Physician: Sheryn Bison [phone]) Specimen Source:  Source: Launa Grill Order Number:  [id number] Lab site:   Appended Document: Colonoscopy 10 year followup  Appended Document: Colonoscopy     Procedures Next Due Date:    Colonoscopy: 08/2020

## 2010-10-04 NOTE — Letter (Signed)
Summary: Patient Notice- Polyp Results  [facility]  [address] [place], [place] [address]   Phone: [phone]  Fax: [phone]        August 24, 2010 MRN: [id number]    [facility] [address] [place], [place]  [address]    Dear Mr. MAJKA,  I am pleased to inform you that the colon polyp(s) removed during your recent colonoscopy was (were) found to be benign (no cancer detected) upon pathologic examination.  I recommend you have a repeat colonoscopy examination in 10_ years to look for recurrent polyps, as having colon polyps increases your risk for having recurrent polyps or even colon cancer in the future.  Should you develop new or worsening symptoms of abdominal pain, bowel habit changes or bleeding from the rectum or bowels, please schedule an evaluation with either your primary care physician or with me.  Additional information/recommendations:  xx__ No further action with gastroenterology is needed at this time. Please      follow-up with your primary care physician for your other healthcare      needs.  __ Please call [phone] to schedule a return visit to review your      situation.  __ Please keep your follow-up visit as already scheduled.  __ Continue treatment plan as outlined the day of your exam.  Please call us if you are having persistent problems or have questions about your condition that have not been fully answered at this time.  Sincerely,  Mardella Layman MD [facility]  This letter has been electronically signed by your physician.  Appended Document: Patient Notice- Polyp Results Letter mailed

## 2010-10-04 NOTE — Progress Notes (Signed)
Summary: Refill/gh  Phone Note Refill Request Message from:  Fax from Pharmacy on August 30, 2010 12:07 PM  Refills Requested: Medication #1:  NASONEX 50 MCG/ACT SUSP One puff in each nostril once daily   Last Refilled: 06/27/2010  Method Requested: Fax to Local Pharmacy Initial call taken by: Angelina Ok RN,  August 30, 2010 12:07 PM  Follow-up for Phone Call       Follow-up by: Clerance Lav MD,  August 30, 2010 12:38 PM    Prescriptions: NASONEX 50 MCG/ACT SUSP (MOMETASONE FUROATE) One puff in each nostril once daily  #1 x 10   Entered and Authorized by:   Clerance Lav MD   Signed by:   Clerance Lav MD on 08/30/2010   Method used:   Faxed to ...       [facility] (retail)       [address]       [place], [place]  [address]       Ph: [phone]       Fax: [phone]   RxID:   [id number]

## 2010-10-05 MED ORDER — SILDENAFIL CITRATE 100 MG PO TABS: 100.0000 mg | ORAL_TABLET | ORAL | Status: DC | PRN

## 2010-10-10 NOTE — Progress Notes (Signed)
Summary: Med change.  Phone Note Refill Request Message from:  Fax from Pharmacy on October 01, 2010 2:04 PM  Refills Requested: Medication #1:  PRAVASTATIN SODIUM 40 MG TABS Take 1 tablet by mouth once a day Pharmacy can provide Lipitor 10 mg daily at no charge.   Method Requested: Fax to Local Pharmacy Initial call taken by: Angelina Ok RN,  October 01, 2010 2:04 PM  Follow-up for Phone Call        wont change. thanks Follow-up by: Clerance Lav MD,  October 01, 2010 5:47 PM

## 2010-10-26 ENCOUNTER — Other Ambulatory Visit: Payer: Self-pay | Admitting: *Deleted

## 2010-10-26 MED ORDER — ATORVASTATIN CALCIUM 10 MG PO TABS: 10.0000 mg | ORAL_TABLET | Freq: Every day | ORAL | Status: DC

## 2010-10-26 NOTE — Telephone Encounter (Signed)
[facility] pharmacy can provide Lipitor 10mg  daily at no charge if you would change statins.  Thanks

## 2010-11-09 ENCOUNTER — Telehealth: Payer: Self-pay | Admitting: *Deleted

## 2010-11-09 NOTE — Telephone Encounter (Signed)
Lipitor 10mg  Rx called to [facility] pharmacy.

## 2010-11-20 ENCOUNTER — Telehealth: Payer: Self-pay | Admitting: *Deleted

## 2010-11-20 NOTE — Telephone Encounter (Signed)
Pt called with c/o flu , SOB, sore throat.   Onset 5 days, taking OTC meds without relief. Denies Chest pain.  Will see tomorrow

## 2010-11-21 ENCOUNTER — Encounter: Payer: Self-pay | Admitting: Internal Medicine

## 2010-11-21 ENCOUNTER — Ambulatory Visit (INDEPENDENT_AMBULATORY_CARE_PROVIDER_SITE_OTHER): Payer: Self-pay | Admitting: Internal Medicine

## 2010-11-21 DIAGNOSIS — J069 Acute upper respiratory infection, unspecified: Secondary | ICD-10-CM

## 2010-11-21 DIAGNOSIS — I10 Essential (primary) hypertension: Secondary | ICD-10-CM

## 2010-11-21 DIAGNOSIS — L301 Dyshidrosis [pompholyx]: Secondary | ICD-10-CM

## 2010-11-21 DIAGNOSIS — F17200 Nicotine dependence, unspecified, uncomplicated: Secondary | ICD-10-CM

## 2010-11-21 DIAGNOSIS — H538 Other visual disturbances: Secondary | ICD-10-CM | POA: Insufficient documentation

## 2010-11-21 DIAGNOSIS — J441 Chronic obstructive pulmonary disease with (acute) exacerbation: Secondary | ICD-10-CM | POA: Insufficient documentation

## 2010-11-21 MED ORDER — DOXYCYCLINE HYCLATE 100 MG PO TABS: 100.0000 mg | ORAL_TABLET | Freq: Two times a day (BID) | ORAL | Status: AC

## 2010-11-21 MED ORDER — CLOBETASOL PROPIONATE 0.05 % EX OINT: TOPICAL_OINTMENT | Freq: Two times a day (BID) | CUTANEOUS | Status: DC

## 2010-11-21 MED ORDER — BENZONATATE 100 MG PO CAPS: 100.0000 mg | ORAL_CAPSULE | Freq: Four times a day (QID) | ORAL | Status: DC | PRN

## 2010-11-21 MED ORDER — DOXYCYCLINE HYCLATE 100 MG PO TABS: 100.0000 mg | ORAL_TABLET | Freq: Two times a day (BID) | ORAL | Status: DC

## 2010-11-21 NOTE — Assessment & Plan Note (Signed)
Will look into if can provide eye exam through [facility] eye care, despite patient not being diabetic.

## 2010-11-21 NOTE — Assessment & Plan Note (Signed)
Requests refill of ointment. - Refill given

## 2010-11-21 NOTE — Assessment & Plan Note (Signed)
Mild exacerbation indicated by increased sputum production, increased purulence of sputum, dyspnea. - Will treat with doxycyline x 10 days - Will need PFTs after acute illness resolves, to assess if further escalation of therapy is warranted. - Continue Albuterol and Spiriva.

## 2010-11-21 NOTE — Assessment & Plan Note (Signed)
Likely viral etiology, however, given history of continued tobacco use, COPD, frequent respiratory illness, will treat with antibiotics for a mild COPD exacerbation.  - Doxycycline x 10 days - Tessalon perles for supportive treatment

## 2010-11-21 NOTE — Assessment & Plan Note (Signed)
Mildly elevated today, however pt acutely ill and is taking robitussin for cold symptoms, therefore bp may be falsely elevated. - Continue current therapy - Will reassess next visit. - Encouraged smoking cessation.

## 2010-11-21 NOTE — Patient Instructions (Signed)
1) Please follow-up at the clinic in 1 month, at which time we will reevaluate your breathing, blood pressure. 2) You have been started on new medication(s), if you develop throat closing, tongue swelling, rash, please stop the medication and call the clinic at [phone] and go to the ER. 3) Please bring all of your medications in a bag to your next visit. 4) If you are diabetic, please bring your meter to your next visit. 5) If symptoms worsen, or new symptoms arise, please call the clinic or go to the ER.

## 2010-11-21 NOTE — Assessment & Plan Note (Signed)
Encouraged smoking cessation 

## 2010-11-21 NOTE — Progress Notes (Signed)
Subjective:    Patient ID: Samuel Chen, male    DOB: [birth year], 55 y.o.   MRN: [id number]  HPI Pt is a 55 yo male with PMHX of HTN, HLD, tobacco abuse, alcohol abuse, COPD who presents to the clinic today for the following:  1) URI - Patient indicates 1 week hx of chest congestion, productive cough with dry phlegm, nasal congestion, mild sore throats, weakness, decreased appetite, occasional headaches. Feels more short of breath, will occasionally gasp inward for breath. Taking Robitussin, feels like cough is getting worse. Still smoking 1/2 ppd. Using albuterol once daily. No recent sick contacts.   No fevers, chills, nausea, vomiting, abd pain, diarrhea, facial pain, ear pain, myalgias  2) HTN - Patient does not check blood pressure regularly at home. Currently taking Atenolol 25mg , Maxzide 25-37.5mg , which he states he is taking regularly and without missing doses. Denies dizziness, lightheadedness, chest pain, vision changes.   3) Blurriness of vision - Feels like vision is getting worse over last several years, has not had regular f/u with optometrist 2/2 no insurance.   Review of Systems Per HPI.  Current Outpatient Medications Medication Sig  . albuterol (PROVENTIL,VENTOLIN) 90 MCG/ACT inhaler Inhale 1 -2 puffs every 4 hours as needed for SOB   . atenolol (TENORMIN) 25 MG tablet Take 25 mg by mouth daily.    . clobetasol (TEMOVATE) 0.05 % ointment Apply topically 2 (two) times daily. Apply to the itchy areas on legs twice a day for seven days  . esomeprazole (NEXIUM) 40 MG capsule Take 40 mg by mouth daily.    . mometasone (NASONEX) 50 MCG/ACT nasal spray 1 spray by Each Nare route daily.    . pravastatin (PRAVACHOL) 40 MG tablet Take 40 mg by mouth daily.    . sildenafil (VIAGRA) 100 MG tablet Take 1 tablet (100 mg total) by mouth as needed for Erectile Dysfunction.  Marland Kitchen tiotropium (SPIRIVA HANDIHALER) 18 MCG inhalation capsule Place 18 mcg into inhaler and inhale daily.      Marland Kitchen triamterene-hydrochlorothiazide (MAXZIDE-25) 37.5-25 MG per tablet Take 1 tablet by mouth daily.    Marland Kitchen atorvastatin (LIPITOR) 10 MG tablet Take 1 tablet (10 mg total) by mouth daily.  . benzonatate (TESSALON PERLES) 100 MG capsule Take 1 capsule (100 mg total) by mouth every 6 (six) hours as needed for cough.  . diclofenac (VOLTAREN) 75 MG EC tablet Take 75 mg by mouth daily. With food      Allergies Acetaminophen and Aspirin  Past Medical History  Diagnosis Date  . GERD (gastroesophageal reflux disease)   . Hypertension   . Hyperlipidemia   . COPD (chronic obstructive pulmonary disease)     Questionable diagnosis.  . 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 Myalgia 11/2007    Likely 2/2 Pravachol  . Dyshidrotic eczema   . Tobacco abuse     Past Surgical History  Procedure Date  . Hand surgery 2002    right hand, tendon repair - tendons damanged at work       Objective:   Physical Exam General: Vital signs reviewed and noted. Well-developed,well-nourished,in no acute distress; alert,appropriate and cooperative throughout examination. Head: normocephalic, atraumatic. Ears: TM nonerythematous, not bulging, good light reflex bilaterally. Nose: Mucous membranes mildly inflamed, nonerythematous. Throat: Oropharynx mildly erythematous, no exudate appreciated.  Neck: No deformities, masses, or tenderness noted. Lungs: Normal respiratory effort. Diffuse coarse breath sounds with occasional wheezing. Heart: RRR. S1 and S2 normal without gallop, murmur, or  rubs.  Abdomen: BS normoactive. Soft, Nondistended, non-tender.  No masses or organomegaly. Extremities: No pretibial edema.     Assessment & Plan:  Case and plan of care discussed with Dr. Blanch Media.

## 2010-12-26 ENCOUNTER — Encounter: Payer: Self-pay | Admitting: Internal Medicine

## 2010-12-26 ENCOUNTER — Ambulatory Visit (INDEPENDENT_AMBULATORY_CARE_PROVIDER_SITE_OTHER): Payer: Self-pay | Admitting: Internal Medicine

## 2010-12-26 DIAGNOSIS — E785 Hyperlipidemia, unspecified: Secondary | ICD-10-CM

## 2010-12-26 DIAGNOSIS — J449 Chronic obstructive pulmonary disease, unspecified: Secondary | ICD-10-CM

## 2010-12-26 DIAGNOSIS — F17200 Nicotine dependence, unspecified, uncomplicated: Secondary | ICD-10-CM

## 2010-12-26 DIAGNOSIS — J309 Allergic rhinitis, unspecified: Secondary | ICD-10-CM

## 2010-12-26 DIAGNOSIS — J4489 Other specified chronic obstructive pulmonary disease: Secondary | ICD-10-CM

## 2010-12-26 DIAGNOSIS — I10 Essential (primary) hypertension: Secondary | ICD-10-CM

## 2010-12-26 DIAGNOSIS — H538 Other visual disturbances: Secondary | ICD-10-CM

## 2010-12-26 DIAGNOSIS — J441 Chronic obstructive pulmonary disease with (acute) exacerbation: Secondary | ICD-10-CM

## 2010-12-26 NOTE — Assessment & Plan Note (Signed)
Continue flonase. No other therapy needed. No evidence of sinusitis.

## 2010-12-26 NOTE — Assessment & Plan Note (Signed)
I will refer him for PFTs. His symptoms have resolved but he continues to smoke and it will be important to get the baseline PFTs.

## 2010-12-26 NOTE — Assessment & Plan Note (Signed)
Encouraged him to get atleast optometrist look at the eyes, gave address of [facility] which provides eye exam and two pairs of glasses for $70.

## 2010-12-26 NOTE — Assessment & Plan Note (Signed)
Continues to use inhalers including albuterol. Advised taking just spiriva and use albuterol as needed.

## 2010-12-26 NOTE — Assessment & Plan Note (Signed)
Subjective:  Samuel Chen is a 55 y.o. male with hypertension. Current Outpatient Prescriptions  Medication Sig Dispense Refill  . albuterol (PROVENTIL,VENTOLIN) 90 MCG/ACT inhaler Inhale 1 -2 puffs every 4 hours as needed for SOB       . atenolol (TENORMIN) 25 MG tablet Take 25 mg by mouth daily.        Marland Kitchen atorvastatin (LIPITOR) 10 MG tablet Take 1 tablet (10 mg total) by mouth daily.  30 tablet  11  . benzonatate (TESSALON PERLES) 100 MG capsule Take 1 capsule (100 mg total) by mouth every 6 (six) hours as needed for cough.  40 capsule  1  . clobetasol (TEMOVATE) 0.05 % ointment Apply topically 2 (two) times daily. Apply to the itchy areas on legs twice a day for seven days  30 g  0  . diclofenac (VOLTAREN) 75 MG EC tablet Take 75 mg by mouth daily. With food        . esomeprazole (NEXIUM) 40 MG capsule Take 40 mg by mouth daily.        . mometasone (NASONEX) 50 MCG/ACT nasal spray 1 spray by Each Nare route daily.        . pravastatin (PRAVACHOL) 40 MG tablet Take 40 mg by mouth daily.        . sildenafil (VIAGRA) 100 MG tablet Take 1 tablet (100 mg total) by mouth as needed for Erectile Dysfunction.  30 tablet  1  . tiotropium (SPIRIVA HANDIHALER) 18 MCG inhalation capsule Place 18 mcg into inhaler and inhale daily.        Marland Kitchen triamterene-hydrochlorothiazide (MAXZIDE-25) 37.5-25 MG per tablet Take 1 tablet by mouth daily.          Hypertension ROS: taking medications as instructed, no medication side effects noted, no TIA's, no chest pain on exertion, no dyspnea on exertion and no swelling of ankles.  New concerns: none   Objective:  BP 135/77  Pulse 60  Temp(Src) 97.3 F (36.3 C) (Oral)  Ht 5\' 3"  (1.6 m)  Wt 137 lb 14.4 oz (62.551 kg)  BMI 24.43 kg/m2  Assessment:   Hypertension well controlled.   Plan:  Current treatment plan is effective, no change in therapy.Marland Kitchen

## 2010-12-26 NOTE — Patient Instructions (Signed)
Return in six months.  Follow up with the lung doctors. Get your eyes checked STOP SMOKING!!!

## 2010-12-26 NOTE — Assessment & Plan Note (Signed)
Decreased use, advised about quitting. He plans to set up a date.

## 2010-12-26 NOTE — Assessment & Plan Note (Signed)
Continue current regimen

## 2010-12-26 NOTE — Progress Notes (Signed)
  Subjective:    Patient ID: Samuel Chen, male    DOB: Aug 29, 1956, 55 y.o.   MRN: [id number]  HPI  Pt is a 55 yo male with PMHX of HTN, HLD, tobacco abuse, alcohol abuse, COPD who presents to the clinic today for the following:  1) URI/COPD exac - now resolved 2) HTN - Patient does not check blood pressure regularly at home. Currently taking Atenolol 25mg , Maxzide 25-37.5mg , which he states he is taking regularly and without missing doses. Denies dizziness, lightheadedness, chest pain, vision changes.   3) Blurriness of vision - Feels like vision is getting worse over last several years, has not had regular f/u with optometrist 2/2 no insurance.   Review of Systems  Per HPI.     Allergies Acetaminophen and Aspirin  Past Medical History  Diagnosis Date  . GERD (gastroesophageal reflux disease)   . Hypertension   . Hyperlipidemia   . COPD (chronic obstructive pulmonary disease)     Questionable diagnosis.  . 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 Myalgia 11/2007    Likely 2/2 Pravachol  . Dyshidrotic eczema   . Tobacco abuse     Past Surgical History  Procedure Date  . Hand surgery 2002    right hand, tendon repair - tendons damanged at work       Objective:   Physical Exam  General: Vital signs reviewed and noted. Well-developed,well-nourished,in no acute distress; alert,appropriate and cooperative throughout examination. Head: normocephalic, atraumatic. Ears: TM nonerythematous, not bulging, good light reflex bilaterally. Nose: Mucous membranes mildly inflamed, nonerythematous. Throat: Oropharynx mildly erythematous, no exudate appreciated.  Neck: No deformities, masses, or tenderness noted. Lungs: Normal respiratory effort. Diffuse coarse breath sounds with occasional wheezing. Heart: RRR. S1 and S2 normal without gallop, murmur, or rubs.  Abdomen: BS normoactive. Soft, Nondistended, non-tender.  No masses or organomegaly. Extremities: No  pretibial edema.     Assessment & Plan:

## 2011-01-09 ENCOUNTER — Encounter: Payer: Self-pay | Admitting: Critical Care Medicine

## 2011-01-09 ENCOUNTER — Ambulatory Visit (INDEPENDENT_AMBULATORY_CARE_PROVIDER_SITE_OTHER): Payer: Self-pay | Admitting: Critical Care Medicine

## 2011-01-09 ENCOUNTER — Ambulatory Visit (INDEPENDENT_AMBULATORY_CARE_PROVIDER_SITE_OTHER)
Admission: RE | Admit: 2011-01-09 | Discharge: 2011-01-09 | Disposition: A | Discharge status: Home / Self Care | Payer: Self-pay | Source: Ambulatory Visit | Attending: Critical Care Medicine | Admitting: Critical Care Medicine

## 2011-01-09 DIAGNOSIS — J449 Chronic obstructive pulmonary disease, unspecified: Secondary | ICD-10-CM | POA: Insufficient documentation

## 2011-01-09 DIAGNOSIS — K219 Gastro-esophageal reflux disease without esophagitis: Secondary | ICD-10-CM

## 2011-01-09 DIAGNOSIS — J019 Acute sinusitis, unspecified: Secondary | ICD-10-CM

## 2011-01-09 DIAGNOSIS — J309 Allergic rhinitis, unspecified: Secondary | ICD-10-CM | POA: Insufficient documentation

## 2011-01-09 DIAGNOSIS — F1010 Alcohol abuse, uncomplicated: Secondary | ICD-10-CM | POA: Insufficient documentation

## 2011-01-09 DIAGNOSIS — E785 Hyperlipidemia, unspecified: Secondary | ICD-10-CM

## 2011-01-09 DIAGNOSIS — I10 Essential (primary) hypertension: Secondary | ICD-10-CM

## 2011-01-09 DIAGNOSIS — Z720 Tobacco use: Secondary | ICD-10-CM | POA: Insufficient documentation

## 2011-01-09 DIAGNOSIS — F17200 Nicotine dependence, unspecified, uncomplicated: Secondary | ICD-10-CM

## 2011-01-09 MED ORDER — VARENICLINE TARTRATE 0.5 MG PO TABS: 1.0000 mg | ORAL_TABLET | Freq: Two times a day (BID) | ORAL | Status: DC

## 2011-01-09 MED ORDER — AMOXICILLIN-POT CLAVULANATE 875-125 MG PO TABS: 1.0000 | ORAL_TABLET | Freq: Two times a day (BID) | ORAL | Status: AC

## 2011-01-09 MED ORDER — ALBUTEROL 90 MCG/ACT IN AERS: 2.0000 | INHALATION_SPRAY | RESPIRATORY_TRACT | Status: DC | PRN

## 2011-01-09 MED ORDER — MOMETASONE FUROATE 50 MCG/ACT NA SUSP: 2.0000 | Freq: Every day | NASAL | Status: DC

## 2011-01-09 MED ORDER — FLUTICASONE-SALMETEROL 250-50 MCG/DOSE IN AEPB: 1.0000 | INHALATION_SPRAY | Freq: Two times a day (BID) | RESPIRATORY_TRACT | Status: DC

## 2011-01-09 NOTE — Progress Notes (Addendum)
Subjective:    Patient ID: Samuel Chen, male    DOB: [birth year], 55 y.o.   MRN: [id number] 55 y.o.WM Smoker referred for cough and dyspnea, Hx as below: Shortness of Breath This is a new problem. The current episode started more than 1 month ago (started with URI). The problem occurs daily (dyspneic at rest and exertion.  Walking 260ft, worse up incline or stairs., notes some nocturnal dypsnea). The problem has been unchanged. Duration: Will recover in a few minutes. Associated symptoms include PND, sputum production and wheezing. Pertinent negatives include no abdominal pain, chest pain, ear pain, fever, headaches, hemoptysis, leg pain, leg swelling, neck pain, orthopnea, rash, rhinorrhea, sore throat, syncope or vomiting. The symptoms are aggravated by exercise, any activity, URIs and pollens. Risk factors include smoking. He has tried ipratropium inhalers and beta agonist inhalers for the symptoms. The treatment provided mild relief. There is no history of allergies, asthma, CAD, chronic lung disease, COPD, a heart failure or pneumonia. (No testing for allergies)  Cough This is a new problem. The current episode started more than 1 month ago. The problem has been unchanged. The problem occurs hourly. The cough is productive of brown sputum (green mucus). Associated symptoms include shortness of breath and wheezing. Pertinent negatives include no chest pain, ear pain, eye redness, fever, headaches, hemoptysis, nasal congestion, postnasal drip, rash, rhinorrhea or sore throat. The symptoms are aggravated by exercise and pollens. His past medical history is significant for environmental allergies. There is no history of asthma, bronchiectasis, bronchitis, COPD, emphysema or pneumonia. no testing for allergies    Past Medical History  Diagnosis Date  . GERD (gastroesophageal reflux disease)   . Hypertension   . Hyperlipidemia   . COPD (chronic obstructive pulmonary disease)     Questionable  diagnosis.  . 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 Myalgia 11/2007    Likely 2/2 Pravachol  . Dyshidrotic eczema   . Tobacco abuse   . Allergic rhinitis      Family History  Problem Relation Age of Onset  . Heart disease Sister   . Heart attack Sister     34-53yo  . Heart disease Father      History   Social History  . Marital Status: Single    Spouse Name: N/A    Number of Children: N/A  . Years of Education: N/A   Occupational History  . Not on file.   Social History Main Topics  . Smoking status: Current Everyday Smoker -- 1.5 packs/day for 40 years    Types: Cigarettes  . Smokeless tobacco: Never Used  . Alcohol Use: Yes     6 to 7 beers daily x 25 years at least   . Drug Use: No  . Sexually Active: Yes   Other Topics Concern  . Not on file   Social History Narrative   Financial assistance approved for 100% discount at [facility] and has [facility] card per Rudell Cobb October 13,2011 4:21PM     Allergies  Allergen Reactions  . Acetaminophen     REACTION: (causes liver problems)  . Aspirin     REACTION: bleeding/nose bleeds     Outpatient Prescriptions Prior to Visit  Medication Sig Dispense Refill  . atenolol (TENORMIN) 25 MG tablet Take 25 mg by mouth daily.        . clobetasol (TEMOVATE) 0.05 % ointment Apply topically 2 (two) times daily. Apply to the itchy areas on legs twice a  day for seven days  30 g  0  . esomeprazole (NEXIUM) 40 MG capsule Take 40 mg by mouth daily.        . pravastatin (PRAVACHOL) 40 MG tablet Take 40 mg by mouth daily.        . sildenafil (VIAGRA) 100 MG tablet Take 1 tablet (100 mg total) by mouth as needed for Erectile Dysfunction.  30 tablet  1  . triamterene-hydrochlorothiazide (MAXZIDE-25) 37.5-25 MG per tablet Take 1 tablet by mouth daily.        . mometasone (NASONEX) 50 MCG/ACT nasal spray 1 spray by Each Nare route daily.        Marland Kitchen tiotropium (SPIRIVA HANDIHALER) 18 MCG inhalation capsule Place 18 mcg into inhaler  and inhale daily.        Marland Kitchen albuterol (PROVENTIL,VENTOLIN) 90 MCG/ACT inhaler Inhale 1 -2 puffs every 4 hours as needed for SOB       . atorvastatin (LIPITOR) 10 MG tablet Take 1 tablet (10 mg total) by mouth daily.  30 tablet  11  . benzonatate (TESSALON PERLES) 100 MG capsule Take 1 capsule (100 mg total) by mouth every 6 (six) hours as needed for cough.  40 capsule  1  . diclofenac (VOLTAREN) 75 MG EC tablet Take 75 mg by mouth daily. With food            Review of Systems  Constitutional: Negative for fever and unexpected weight change.  HENT: Negative for ear pain, nosebleeds, congestion, sore throat, rhinorrhea, sneezing, trouble swallowing, neck pain, dental problem, postnasal drip and sinus pressure.   Eyes: Negative for redness and itching.  Respiratory: Positive for cough, sputum production, shortness of breath and wheezing. Negative for hemoptysis and chest tightness.   Cardiovascular: Positive for PND. Negative for chest pain, palpitations, orthopnea, leg swelling and syncope.  Gastrointestinal: Negative for nausea, vomiting and abdominal pain.  Genitourinary: Negative for dysuria.  Musculoskeletal: Negative for joint swelling.  Skin: Negative for rash.  Neurological: Negative for headaches.  Hematological: Positive for environmental allergies. Does not bruise/bleed easily.  Psychiatric/Behavioral: Negative for dysphoric mood. The patient is not nervous/anxious.        Objective:   Physical Exam Filed Vitals:   01/09/11 0857  BP: 118/80  Pulse: 75  Temp: 98.3 F (36.8 C)  TempSrc: Oral  Height: 5\' 3"  (1.6 m)  Weight: 140 lb 9.6 oz (63.776 kg)  SpO2: 99%    Gen: Pleasant, well-nourished, in no distress,  normal affect  ENT: No lesions,  mouth clear,  oropharynx clear, no postnasal drip  Neck: No JVD, no TMG, no carotid bruits  Lungs: No use of accessory muscles, no dullness to percussion, exp wheezes,  Poor airflow   Cardiovascular: RRR, heart sounds normal,  no murmur or gallops, no peripheral edema  Abdomen: soft and NT, no HSM,  BS normal  Musculoskeletal: No deformities, no cyanosis or clubbing  Neuro: alert, non focal  Skin: Warm, no lesions or rashes     01/09/11 CXR: Findings: Heart and mediastinal contours are within normal limits. No focal opacities or effusions. No acute bony abnormality.  IMPRESSION: No acute findings   Assessment & Plan:   COPD (chronic obstructive pulmonary disease) COPD with asthmatic bronchitis smoking induced.  Golds stage 0.  FeV1 90% but FEF 25/75 59% Ongoing smoking abuse. Note CXR without active disease 01/09/11 PFT Conversion 01/09/2011  FVC 4  FVC  % Predicted 99  FEV1 2.8  FEV % Predicted 91  FEV1/FVC 70  FeF 25-75 1.99  FeF 25-75 % Predicted 59  Plan Stay on chantix , quit smoking  Start amoxcil/clav one twice daily Stop spiriva Start advair 250 1 pf bid  Use proventil as needed Return 6 weeks   TOBACCO ABUSE Ongoing tobacco abuse Plan smoking cessation counselling spent on the pt Cont chantix     Updated Medication List Outpatient Encounter Prescriptions as of 01/09/2011  Medication Sig Dispense Refill  . atenolol (TENORMIN) 25 MG tablet Take 25 mg by mouth daily.        . clobetasol (TEMOVATE) 0.05 % ointment Apply topically 2 (two) times daily. Apply to the itchy areas on legs twice a day for seven days  30 g  0  . esomeprazole (NEXIUM) 40 MG capsule Take 40 mg by mouth daily.        . mometasone (NASONEX) 50 MCG/ACT nasal spray 2 sprays by Nasal route daily. Each nostril  17 g  6  . pravastatin (PRAVACHOL) 40 MG tablet Take 40 mg by mouth daily.        . sildenafil (VIAGRA) 100 MG tablet Take 1 tablet (100 mg total) by mouth as needed for Erectile Dysfunction.  30 tablet  1  . triamterene-hydrochlorothiazide (MAXZIDE-25) 37.5-25 MG per tablet Take 1 tablet by mouth daily.        Marland Kitchen DISCONTD: mometasone (NASONEX) 50 MCG/ACT nasal spray 1 spray by Each Nare route daily.         Marland Kitchen DISCONTD: tiotropium (SPIRIVA HANDIHALER) 18 MCG inhalation capsule Place 18 mcg into inhaler and inhale daily.        Marland Kitchen albuterol (PROVENTIL,VENTOLIN) 90 MCG/ACT inhaler Inhale 2 puffs into the lungs every 4 (four) hours as needed for wheezing. Inhale 1 -2 puffs every 4 hours as needed for SOB  17 g  6  . amoxicillin-clavulanate (AUGMENTIN) 875-125 MG per tablet Take 1 tablet by mouth 2 (two) times daily.  20 tablet  0  . Fluticasone-Salmeterol (ADVAIR) 250-50 MCG/DOSE AEPB Inhale 1 puff into the lungs 2 (two) times daily.  1 each  6  . varenicline (CHANTIX) 0.5 MG tablet Take 2 tablets (1 mg total) by mouth 2 (two) times daily.  60 tablet  2  . DISCONTD: albuterol (PROVENTIL,VENTOLIN) 90 MCG/ACT inhaler Inhale 1 -2 puffs every 4 hours as needed for SOB       . DISCONTD: atorvastatin (LIPITOR) 10 MG tablet Take 1 tablet (10 mg total) by mouth daily.  30 tablet  11  . DISCONTD: benzonatate (TESSALON PERLES) 100 MG capsule Take 1 capsule (100 mg total) by mouth every 6 (six) hours as needed for cough.  40 capsule  1  . DISCONTD: diclofenac (VOLTAREN) 75 MG EC tablet Take 75 mg by mouth daily. With food        . DISCONTD: varenicline (CHANTIX) 0.5 MG tablet Take 2 tablets (1 mg total) by mouth 2 (two) times daily.  60 tablet  2

## 2011-01-09 NOTE — Patient Instructions (Signed)
Stay on chantix , quit smoking  Chest Xray today Start amoxcil/clav one twice daily Stop spiriva Start advair  Use proventil as needed Return 6 weeks

## 2011-01-09 NOTE — Progress Notes (Signed)
Quick Note:  Notify the patient that the Xray is stable and no pneumonia No change in medications are recommended. Continue current meds as prescribed at last office visit ______ 

## 2011-01-09 NOTE — Progress Notes (Signed)
Quick Note:  Called, spoke with pt. He was informed of CXR results and recs per PW. He verbalized understanding of this and voiced no further questions at this time. ______ 

## 2011-01-10 ENCOUNTER — Telehealth: Payer: Self-pay | Admitting: Critical Care Medicine

## 2011-01-10 DIAGNOSIS — Z720 Tobacco use: Secondary | ICD-10-CM | POA: Insufficient documentation

## 2011-01-10 NOTE — Telephone Encounter (Signed)
Spoke with pharmacist and notified of directions for albuterol. Nothing further needed.

## 2011-01-10 NOTE — Assessment & Plan Note (Signed)
Ongoing tobacco abuse Plan smoking cessation counselling spent on the pt Cont chantix

## 2011-01-10 NOTE — Telephone Encounter (Signed)
[id number]

## 2011-01-10 NOTE — Assessment & Plan Note (Addendum)
COPD with asthmatic bronchitis smoking induced.  Golds stage 0.  FeV1 90% but FEF 25/75 59% Ongoing smoking abuse. Note CXR without active disease 01/09/11 PFT Conversion 01/09/2011  FVC 4  FVC  % Predicted 99  FEV1 2.8  FEV % Predicted 91  FEV1/FVC 70  FeF 25-75 1.99  FeF 25-75 % Predicted 59  Plan Stay on chantix , quit smoking  Start amoxcil/clav one twice daily Stop spiriva Start advair 250 1 pf bid  Use proventil as needed Return 6 weeks

## 2011-01-18 NOTE — Op Note (Signed)
Samuel Chen, Samuel Chen                        ACCOUNT NO.:  [id number]   MEDICAL RECORD NO.:  [id number]                   PATIENT TYPE:  AMB   LOCATION:  DSC                                  FACILITY:  [facility]   PHYSICIAN:  Cindee Salt, M.D.                    DATE OF BIRTH:  09-29-1955   DATE OF PROCEDURE:  08/31/2002  DATE OF DISCHARGE:                                 OPERATIVE REPORT   PREOPERATIVE DIAGNOSES:  Ulnar carpal abutment right wrist.   POSTOPERATIVE DIAGNOSES:  Ulnar carpal abutment right wrist.   OPERATION:  Arthroscopic debridement, ulnar bone arthroplasty, removal of  loose bodies, removal of avulsed cartilage, ulnar lunate with ulnar carpal  ligament shrinkage.   SURGEON:  Cindee Salt, M.D.   ASSISTANT:  R.N.   ANESTHESIA:  Axillary block.   HISTORY:  The patient is a 56 year old male who has ulnar wrist pain.  X-  rays reveal changes in his lunate, a long ulna.  On x-ray MRI reveals  questionable circulation to the lunate and probably tear.   PROCEDURE:  The patient was brought to the operating room where an axillary  block was carried out without difficulty.  He was prepped and draped using  Betadine solution right arm free supine position.  He was placed in the  arthroscopy chair, 10 pounds of traction applied.  The joint inflated  through the three port portal.  A transverse incision was made through the  skin only deepened with a hemostat.  One trochar was used to enter the  joint.  It was there to protect the EPL tendon.  The joint was inspected.  A  tear of the scapholunate ligament was present.  The radial ligaments were  intact.  The cartilage and scaphoid radial lunate and distal radius were all  intact.  The TFCC tear was medial apparent at the ulna protruding through.  A large avulsion of cartilage was present from the ulnar aspect of the  lunate and a loose body was present.  An irrigation catheter was placed in  fixed view.  A 4-5 portal was  opened after localization with a 22 gauge  needle.  Loose body was removed.  Debridement of the triangle fiber  cartilage was then performed using an Arthur wand and full radius shaver.  The PFCC was removed with the Carlota Raspberry.  The cartilage  was removed from the head of ulna.  The scope was placed in the 4-5 portal.  The lunotriquetral ligament obviously showed a tear.  Approximately one  third of the ulnar cartilage from the lunate was avulsed or loose and this  was removed.  The bone itself was quite hard and firm without any evidence  of necrosis.  The loose cartilage was removed with a full radius shaver.  A  bur was then inserted and the ulnar head was removed.  Image  intensification  revealed that the head was adequately decompressed and level with the distal  radius.  The lunar triquetral ligament tear was debrided.  An Merton Border wand  was then inserted after debridement and a shrinkage of the ulnar ligaments  was performed.  This was both the ulnar triquetral and ulnar lunate  ligament.  The mid carpal joint was inspected.  There was no instability at  the lunar triquetral joint.  There was no instability at the scapholunate  proximal cartilage.  The capitate triquetrum and the distal scapholunate  triquetrum showed no articular damage.  The instruments were removed.  The  proximal joint  reinspected.  No further lesions were identified.  A  synovectomy performed with the Arthur wand.  The instruments were removed,  the portals closed with interrupted 5-0 nylon sutures, a sterile compression  dressing, dorsal palmar splint was applied.  Patient tolerated the procedure  well and was taken to the recovery room for observation in satisfactory  condition.   DISPOSITION:  He is discharged home to return to the [facility]  [place] in one week on Vicodin and Keflex.                                               Cindee Salt, M.D.    GK/MEDQ  D:  08/31/2002  T:   08/31/2002  Job:  [id number]   cc:   Cindee Salt, M.D.  [address]  [place]  [place] [address]  Fax: [phone]

## 2011-01-18 NOTE — Discharge Summary (Signed)
NAMEORREN, PIETSCH NO.:  [id number]   MEDICAL RECORD NO.:  [id number]          PATIENT TYPE:  INP   LOCATION:  3025                         FACILITY:  [facility]   PHYSICIAN:  Madaline Guthrie, M.D.    DATE OF BIRTH:  November 12, 1955   DATE OF ADMISSION:  07/22/2004  DATE OF DISCHARGE:  07/27/2004                                 DISCHARGE SUMMARY   PHYSICIANS:  Attending:  Madaline Guthrie, M.D.  Resident:  Manning Charity, MD  Intern:  Keitha Butte, MD   DISCHARGE DIAGNOSES:  1.  Acute hepatitis/hepatocellular injury.  2.  History of heavy alcohol use.  3.  Cough/questionable pneumonia.  4.  Tobacco abuse.   DISCHARGE MEDICATIONS:  Avelox 400 mg 1 p.o. daily for 3 additional days at  time of discharge.   DISPOSITION AND FOLLOWUP:  Mr. Sandler was without visible jaundice, without  any abdominal pain, nausea or vomiting at time of discharge.  He did  continue to have some mild cough.  He is to be followed up at the [facility] on Tuesday, August 07, 2004.  At that time, labs  will be drawn to check a CBC as well as a comprehensive metabolic panel to  evaluate for any leukocytosis as well as evidence of resolution of grossly  elevated liver transaminase.  Also at time of followup, Mr. Galea  fractionation of his transferrin saturation, which was sent to an outside  lab in [place], should be followed to see if any type of liver biopsy needs to  be pursued to evaluate for hereditary hemochromatosis.   BRIEF ADMISSION HISTORY AND PHYSICAL:  Mr, Hessel is a 55 year old Caucasian  gentleman with a past medical history significant for chronic alcohol abuse,  who was referred and admitted to our service by Dr. Andi Devon at [facility] because of a finding of grossly elevated LFTs and bilirubin when he was  seen for followup of flu-like symptoms.  At that time, Dr. Andi Devon informed  me that his AST was 6700, ALT 8200, and a bilirubin of 6.  On  interview, the  patient describes feeling tired and also having a cough productive of green  sputum.  He had been to another doctor at [facility] and was given a  prescription for some type of antibiotic which he does not know the specific  name of.  He has had more symptomatic complaints.  He was taking 2 Extra-  Strength Tylenol 6 times a day.  At times during interview, he said he was  feeling somewhat better.  Of note, the patient did confirm that he continued  to drink close to a 12-pack of beer a day which is his normal drinking habit  during this period of time.  He was given a CAGE evaluation and responded  no to all four questions.  Additionally, he gives history of having  greater than 10 but less than 20 productive sexual encounters over the  previous year.  He additionally denied any IV drug use or any history of  blood transfusions.   ADMISSION PHYSICAL  EXAMINATION:  VITAL SIGNS:  +114, blood pressure 128/84,  temperature 97.0, respirations 20, O2 saturation 98% on room air.  GENERAL:  Mr. 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  EYES:  Pupils equal, round, and reactive to light.  Extraocular muscles  intact.  Scleral icterus was present.  ENT:  As mentioned in GENERAL above, evidence of rhinophyma, otherwise  oropharynx clear without erythema or exudate.  NECK:  Supple with no evidence of bruit or JVD.  RESPIRATIONS:  Coarse breath sounds bilaterally with scattered rales  throughout.  CARDIOVASCULAR:  Increased rate but regular rhythm.  No murmurs, gallops, or  rubs were auscultated.  He had normal S1 and S2.  ABDOMEN:  Soft, nontender, nondistended.  Positive bowel sounds in all four  quadrants.  No hepatosplenomegaly was appreciated.  EXTREMITIES:  No cyanosis, clubbing, or edema.  SKIN:  Jaundiced-appearing skin throughout.  LYMPH:  No lymphadenopathy globally.  MUSCULOSKELETAL:  No CVA tenderness bilaterally.   NEUROLOGIC:  Alert and oriented x 4.  Cranial nerves II-XII grossly intact.  No focal deficits were noted.  Psych:  The patient answered questions  appropriately.  Negative for asterixis.   LABORATORY DATA AND OTHER STUDIES:  Initial laboratory values:  AST 159, ALT  1671, total protein 5.3, albumin 2.7, total bilirubin 3.2, alkaline  phosphatase 194. CBC:  Hemoglobin 13.7, white blood cell count 7.2,  platelets 154.  Basic metabolic panel:  Sodium 134, potassium 3.0, chloride  99, bicarb 25, BUN 8, creatinine 0.8, blood glucose 113.  AST 583, ALT 3694,  alkaline phosphatase 224, total bilirubin 6.3, albumin 3.3.  PT 14.5, PTT  31.  Acetaminophen level less than 10.   HOSPITAL COURSE:  #1.  Because of Mr. Furney history of frequent Tylenol use in combination  with his heavy alcohol use as well as multiple unprotected sexual  encounters, Mr. Burbach differential in terms of possible sources of his  hepatocellular injury were broad.  He was evaluated for acute viral  hepatitis, and serologies were drawn which were negative for hepatitis A as  well as hepatitis C.  Additionally, acetaminophen level was drawn, as  mentioned in admission labs above, which was less than 10.  However, with  his history of alcohol use with concomitant heavy acetaminophen use in the  week prior to arrival, it was decided that it would be prudent to begin Mr.  Corter on oral Mucomyst which he tolerated.  He was given the full 17-dose  course while admitted to the hospital.  Additionally, because of a negative  acute hepatitis panel, it was thought that these changes could likely have  been due to chronic alcohol use.  However, liver enzymes did show a trend  towards normal and resolving by time of discharge.  As mentioned in Problem  #2, the patient was counseled at length concerning his heavy alcohol use and possible damage that consistent heavy alcohol use could cause to his liver.  Additionally because of  Mr. Seder multiple unprotected sexual encounters,  and HIV test was drawn during this hospital admission which was negative.  He was counseled on the risk of continued unprotected sexual intercourse and  the possibility of contracting multiple STDs including HIV.  He expressed  understanding of this.  Because his acute viral hepatitis serologies were  negative and his alcohol abuse was discontinued obviously in hospital in  addition to any Tylenol, further workup for other causes of acute  hepatocellular injury were pursued  including serologies for autoimmune  hepatitis including ANA as well as anti-smooth-muscle antibody which were  both negative as well as a ceruloplasmin level for Wilson's disease which  was negative and transferrin saturation for hereditary hemochromatosis.  His  iron level came back elevated at 245.  His total ferritin came back elevated  at 3030.  Transferrin saturation, however, could not be calculated in our  labs and were sent to an outside lab for further workup.  The patient did  state that he had a family history of some type of liver disease in his  father; however, he was not able to articulate an exact diagnosis.  Because  of his age and possible history of liver disease, there is continued concern  for possible hereditary hemochromatosis at time of discharge, and, as  mentioned in the Disposition and Followup portion of this Discharge Summary,  this issue should be followed up when final laboratory results return.  Additional, a CMV antibody was drawn which was also pending at time of  discharge.  Mr. Berrett liver enzymes were followed throughout his hospital  course and, as mentioned before, they continued to trend down.  He received  his full 17-course dose of Mucomyst, did not experience any nausea or  vomiting or any abdominal pain.  His bilirubin as well trended down to the  level of 2.4 at the time of discharge, and patient's jaundice resolved.    #2.  HEAVY ALCOHOL USE:  Social work was consulted to speak with Mr. Geers  regarding his heavy alcohol use, questionable family history of liver  disease.  He was encouraged to discontinue all alcohol use and was also  encouraged to follow up with some type of outpatient rehabilitation program  to help him discontinue his alcohol use.  However, he stated to Arts development officer who met with him that alcohol use was not a problem for him and that  he could stop at any time on his own.  He was not amenable to receiving any  outpatient therapy for this issue.  He was told that his current heavy use  of alcohol would most likely lead to detrimental health effects in the  future.  He expressed questionable understanding of this and continued to  disagree that his alcohol use was in any way heavy or pathologic.  This  issue should continue to be pursued at all of Mr. Kroeker followup to see if he continues to drink and if he would be amenable to any type of  outpatient therapy or enrollment in Alcoholic Anonymous classes in the  future.   #3.  COUGH/PNEUMONIA:  Mr. Vanhise was evaluated with a chest x-ray which  showed some slightly increased markings which was read as likely chronic,  but no evidence of focal infiltrate, mass, effusion, or collapse.  However,  Mr. Santone's cough continued throughout his hospital admission.  It was felt  this likely confirmed that tobacco use was contributing, but, because of his  physical exam and pulmonary exam specifically did not show complete  resolution at time of discharge, Mr. Valley was placed on and continued on  Avelox throughout his hospital admission and was told to continue for three  additional days at time of discharge.   #4.  TOBACCO ABUSE:  As above in #3, Mr. Wessell was informed of the  detrimental health effects of continued smoking including his continued  cough.  He was urged to discontinue smoking and, as above, said that he  could  discontinue when he wished and that he will pursue this at a later  time.   #5.  MISCELLANEOUS:  Mr. Bearden was urged not to take any additional Tylenol  or any supplements or cough or cold remedies that included Tylenol at time  of discharge.  He was also told not to take any supplemental vitamins or  iron unless instructed to do so at time of followup.   DISCHARGE LABORATORY DATA AND OTHER STUDIES:  Hemoglobin 13.8, hematocrit  39.1, white blood cell count 8.7, platelets 210.  Metabolic panel:  Sodium  139, potassium 3.4.  (Of note, potassium was supplemented at time of day of  discharge.)  Chloride 108, bicarb 23, BUN 7, creatinine 0.9, blood glucose  167.  AST 70, ALT 599, alkaline phosphatase 190, total bilirubin 2.4.  As  mentioned above, CMV antibodies were pending at time of discharge as well as  transferring saturation which was sent to outside lab.       AK/MEDQ  D:  08/17/2004  T:  08/17/2004  Job:  [id number]   cc:   Manning Charity, MD  [facility]  [address]  [place]  [place] [address]  Fax: [phone]   Dr. Andi Devon, [facility]

## 2011-02-20 ENCOUNTER — Other Ambulatory Visit: Payer: Self-pay | Admitting: *Deleted

## 2011-02-20 ENCOUNTER — Ambulatory Visit (INDEPENDENT_AMBULATORY_CARE_PROVIDER_SITE_OTHER): Payer: Self-pay | Admitting: Critical Care Medicine

## 2011-02-20 ENCOUNTER — Encounter: Payer: Self-pay | Admitting: Critical Care Medicine

## 2011-02-20 DIAGNOSIS — F17200 Nicotine dependence, unspecified, uncomplicated: Secondary | ICD-10-CM

## 2011-02-20 DIAGNOSIS — Z720 Tobacco use: Secondary | ICD-10-CM

## 2011-02-20 DIAGNOSIS — J449 Chronic obstructive pulmonary disease, unspecified: Secondary | ICD-10-CM

## 2011-02-20 MED ORDER — ESOMEPRAZOLE MAGNESIUM 40 MG PO CPDR: 40.0000 mg | DELAYED_RELEASE_CAPSULE | Freq: Every day | ORAL | Status: DC

## 2011-02-20 MED ORDER — VARENICLINE TARTRATE 1 MG PO TABS: 1.0000 mg | ORAL_TABLET | Freq: Two times a day (BID) | ORAL | Status: DC

## 2011-02-20 NOTE — Progress Notes (Signed)
Subjective:    Patient ID: Samuel Chen, male    DOB: [birth year], 55 y.o.   MRN: [id number] 55 y.o.WM Smoker referred for cough and dyspnea, Hx as below: Shortness of Breath This is a new problem. The current episode started more than 1 month ago (started with URI). The problem occurs daily (Notes less dyspnea with exertion). The problem has been rapidly improving. Duration: Will recover in a few minutes. Associated symptoms include sputum production. Pertinent negatives include no abdominal pain, chest pain, ear pain, fever, headaches, hemoptysis, leg pain, leg swelling, neck pain, orthopnea, PND, rash, rhinorrhea, sore throat, syncope, vomiting or wheezing. The symptoms are aggravated by exercise, any activity, URIs and pollens. Risk factors include smoking. He has tried ipratropium inhalers and beta agonist inhalers for the symptoms. The treatment provided mild relief. There is no history of allergies, asthma, CAD, chronic lung disease, COPD, a heart failure or pneumonia. (No testing for allergies)  Cough This is a new problem. The current episode started more than 1 month ago. The problem has been rapidly improving. The cough is productive of brown sputum (green mucus). Pertinent negatives include no chest pain, ear pain, eye redness, fever, headaches, hemoptysis, nasal congestion, postnasal drip, rash, rhinorrhea, sore throat, shortness of breath or wheezing. The symptoms are aggravated by exercise and pollens. His past medical history is significant for environmental allergies. There is no history of asthma, bronchiectasis, bronchitis, COPD, emphysema or pneumonia. no testing for allergies   02/20/2011 Cough and dyspnea is better. Notes burning on the tongue when eats with Advair.   Notes sl amount of mucus.   Notes no real wheeze.  No real chest pain.   No pndrip.  Past Medical History  Diagnosis Date  . GERD (gastroesophageal reflux disease)   . Hypertension   . Hyperlipidemia   . COPD  (chronic obstructive pulmonary disease)     Questionable diagnosis.  . 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 Myalgia 11/2007    Likely 2/2 Pravachol  . Dyshidrotic eczema   . Tobacco abuse   . Allergic rhinitis      Family History  Problem Relation Age of Onset  . Heart disease Sister   . Heart attack Sister     34-35yo  . Heart disease Father      History   Social History  . Marital Status: Single    Spouse Name: N/A    Number of Children: N/A  . Years of Education: N/A   Occupational History  . Not on file.   Social History Main Topics  . Smoking status: Current Everyday Smoker -- 0.5 packs/day for 40 years    Types: Cigarettes  . Smokeless tobacco: Never Used   Comment: chantix helped  . Alcohol Use: Yes     6 to 7 beers daily x 25 years at least   . Drug Use: No  . Sexually Active: Yes   Other Topics Concern  . Not on file   Social History Narrative   Financial assistance approved for 100% discount at [facility] and has [facility] card per Rudell Cobb October 13,2011 4:21PM     Allergies  Allergen Reactions  . Acetaminophen     REACTION: (causes liver problems)  . Aspirin     REACTION: bleeding/nose bleeds     Outpatient Prescriptions Prior to Visit  Medication Sig Dispense Refill  . albuterol (PROVENTIL,VENTOLIN) 90 MCG/ACT inhaler Inhale 2 puffs into the lungs every 4 (four) hours as  needed for wheezing. Inhale 1 -2 puffs every 4 hours as needed for SOB  17 g  6  . atenolol (TENORMIN) 25 MG tablet Take 25 mg by mouth daily.        Marland Kitchen esomeprazole (NEXIUM) 40 MG capsule Take 40 mg by mouth daily.        . Fluticasone-Salmeterol (ADVAIR) 250-50 MCG/DOSE AEPB Inhale 1 puff into the lungs 2 (two) times daily.  1 each  6  . mometasone (NASONEX) 50 MCG/ACT nasal spray 2 sprays by Nasal route daily. Each nostril  17 g  6  . pravastatin (PRAVACHOL) 40 MG tablet Take 40 mg by mouth daily.        . sildenafil (VIAGRA) 100 MG tablet Take 1 tablet (100 mg  total) by mouth as needed for Erectile Dysfunction.  30 tablet  1  . triamterene-hydrochlorothiazide (MAXZIDE-25) 37.5-25 MG per tablet Take 1 tablet by mouth daily.        . clobetasol (TEMOVATE) 0.05 % ointment Apply topically 2 (two) times daily. Apply to the itchy areas on legs twice a day for seven days  30 g  0  . varenicline (CHANTIX) 0.5 MG tablet Take 2 tablets (1 mg total) by mouth 2 (two) times daily.  60 tablet  2      Review of Systems  Constitutional: Negative for fever and unexpected weight change.  HENT: Negative for ear pain, nosebleeds, congestion, sore throat, rhinorrhea, sneezing, trouble swallowing, neck pain, dental problem, postnasal drip and sinus pressure.   Eyes: Negative for redness and itching.  Respiratory: Positive for cough and sputum production. Negative for hemoptysis, chest tightness, shortness of breath and wheezing.   Cardiovascular: Negative for chest pain, palpitations, orthopnea, leg swelling, syncope and PND.  Gastrointestinal: Negative for nausea, vomiting and abdominal pain.  Genitourinary: Negative for dysuria.  Musculoskeletal: Negative for joint swelling.  Skin: Negative for rash.  Neurological: Negative for headaches.  Hematological: Positive for environmental allergies. Does not bruise/bleed easily.  Psychiatric/Behavioral: Negative for dysphoric mood. The patient is not nervous/anxious.        Objective:   Physical Exam  Filed Vitals:   02/20/11 0844  BP: 128/80  Pulse: 76  Temp: 98.5 F (36.9 C)  TempSrc: Oral  Height: 5\' 3"  (1.6 m)  Weight: 141 lb 9.6 oz (64.229 kg)  SpO2: 97%    Gen: Pleasant, well-nourished, in no distress,  normal affect  ENT: No lesions,  mouth clear,  oropharynx clear, no postnasal drip  Neck: No JVD, no TMG, no carotid bruits  Lungs: No use of accessory muscles, no dullness to percussion, exp wheezes,  Poor airflow   Cardiovascular: RRR, heart sounds normal, no murmur or gallops, no peripheral  edema  Abdomen: soft and NT, no HSM,  BS normal  Musculoskeletal: No deformities, no cyanosis or clubbing  Neuro: alert, non focal  Skin: Warm, no lesions or rashes     01/09/11 CXR: Findings: Heart and mediastinal contours are within normal limits. No focal opacities or effusions. No acute bony abnormality.  IMPRESSION: No acute findings   Assessment & Plan:   COPD (chronic obstructive pulmonary disease) Markedly improved airway function but ongoing smoking use, down from 2PPD to 0.5 PPD Pt only took chantix x 1 month Plan Cont advair Refill Chantix and cont to pursue smoking cessation smoking cessation counseling given     Updated Medication List Outpatient Encounter Prescriptions as of 02/20/2011  Medication Sig Dispense Refill  . albuterol (PROVENTIL,VENTOLIN) 90 MCG/ACT inhaler  Inhale 2 puffs into the lungs every 4 (four) hours as needed for wheezing. Inhale 1 -2 puffs every 4 hours as needed for SOB  17 g  6  . atenolol (TENORMIN) 25 MG tablet Take 25 mg by mouth daily.        . clobetasol (TEMOVATE) 0.05 % ointment Apply topically 2 (two) times daily as needed. Apply to the itchy areas on legs twice a day for seven days       . esomeprazole (NEXIUM) 40 MG capsule Take 40 mg by mouth daily.        . Fluticasone-Salmeterol (ADVAIR) 250-50 MCG/DOSE AEPB Inhale 1 puff into the lungs 2 (two) times daily.  1 each  6  . mometasone (NASONEX) 50 MCG/ACT nasal spray 2 sprays by Nasal route daily. Each nostril  17 g  6  . pravastatin (PRAVACHOL) 40 MG tablet Take 40 mg by mouth daily.        . sildenafil (VIAGRA) 100 MG tablet Take 1 tablet (100 mg total) by mouth as needed for Erectile Dysfunction.  30 tablet  1  . triamterene-hydrochlorothiazide (MAXZIDE-25) 37.5-25 MG per tablet Take 1 tablet by mouth daily.        Marland Kitchen DISCONTD: clobetasol (TEMOVATE) 0.05 % ointment Apply topically 2 (two) times daily. Apply to the itchy areas on legs twice a day for seven days  30 g  0    . varenicline (CHANTIX) 1 MG tablet Take 1 tablet (1 mg total) by mouth 2 (two) times daily.  60 tablet  2  . DISCONTD: varenicline (CHANTIX) 0.5 MG tablet Take 2 tablets (1 mg total) by mouth 2 (two) times daily.  60 tablet  2

## 2011-02-20 NOTE — Telephone Encounter (Signed)
Faxed to the [facility]. 

## 2011-02-20 NOTE — Assessment & Plan Note (Signed)
Markedly improved airway function but ongoing smoking use, down from 2PPD to 0.5 PPD Pt only took chantix x 1 month Plan Cont advair Refill Chantix and cont to pursue smoking cessation smoking cessation counseling given

## 2011-02-20 NOTE — Patient Instructions (Signed)
Stay on Advair  Focus on smoking cessation , use Chantix 1mg   Return 4 months

## 2011-03-29 ENCOUNTER — Other Ambulatory Visit: Payer: Self-pay | Admitting: *Deleted

## 2011-03-29 DIAGNOSIS — Z720 Tobacco use: Secondary | ICD-10-CM

## 2011-03-29 DIAGNOSIS — F17200 Nicotine dependence, unspecified, uncomplicated: Secondary | ICD-10-CM

## 2011-03-29 MED ORDER — VARENICLINE TARTRATE 1 MG PO TABS: 1.0000 mg | ORAL_TABLET | Freq: Two times a day (BID) | ORAL | Status: AC

## 2011-03-29 NOTE — Telephone Encounter (Signed)
Received faxed refill request from [facility] Dept regarding Chantix 1 mg take 1 tablet po bid stating pt has been on this medication x 4 months and is requesting a refill.  Asking clarification on if PW want to continue this medication for pt because he has been on it so long.  PW authorized this refill plus 3 additional refills - form was faxed back to [phone].

## 2011-04-01 ENCOUNTER — Ambulatory Visit (INDEPENDENT_AMBULATORY_CARE_PROVIDER_SITE_OTHER): Payer: Self-pay | Admitting: Internal Medicine

## 2011-04-01 ENCOUNTER — Telehealth: Payer: Self-pay | Admitting: *Deleted

## 2011-04-01 ENCOUNTER — Encounter: Payer: Self-pay | Admitting: Internal Medicine

## 2011-04-01 DIAGNOSIS — I10 Essential (primary) hypertension: Secondary | ICD-10-CM

## 2011-04-01 DIAGNOSIS — R1032 Left lower quadrant pain: Secondary | ICD-10-CM

## 2011-04-01 DIAGNOSIS — IMO0001 Reserved for inherently not codable concepts without codable children: Secondary | ICD-10-CM | POA: Insufficient documentation

## 2011-04-01 NOTE — Telephone Encounter (Signed)
Call from pt requesting an appointment for left sided pain.  No nausea, vomiting or fever noted.  Pt when asked said he had no redness in the are.  Pain has been going on for 2-3 days.  Spoke with Dr. Coralee Pesa.  Py given an appointment for this am.

## 2011-04-01 NOTE — Progress Notes (Deleted)
  Subjective:    Patient ID: Samuel Chen, male    DOB: 1955-12-10, 55 y.o.   MRN: [id number]  HPI    Review of Systems     Objective:   Physical Exam        Assessment & Plan:   No problem-specific assessment & plan notes found for this encounter.

## 2011-04-01 NOTE — Progress Notes (Signed)
I agree with Dr. Wainwright's assessment and plan. 

## 2011-04-01 NOTE — Progress Notes (Signed)
Subjective:    Patient ID: Samuel Chen, male    DOB: 12-24-1955, 55 y.o.   MRN: [id number]  HPI Samuel Chen is a 55 year old gentleman with a history of heavy alcohol use and GERD who presents with left inguinal pain.  The pain started three days ago, and is constant, sharply aching, and 7-8/10 severity.  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 No nausea, vomiting, diarrhea, constipation, melena, hematochezia, fever, or chills.  No change in appetite.  The pain is better if he lays down.  He has not tried any medications for the pain.  He thinks the pain has maybe gotten a bit worse since it started three days ago.  He does report that the pain is worse in certain positions.  He drinks 6 beers per day and still smokes 1 ppd.  Review of Systems ROS: General: no fevers, chills, changes in weight, changes in appetite Skin: no rash Pulm: SOB at baseline.  no coughing, wheezing CV: no chest pain, palpitations, shortness of breath Abd: see HPI GU: no dysuria, hematuria, polyuria Ext: no arthralgias, myalgias Neuro: no weakness, numbness, or tingling     Objective:   Physical Exam Filed Vitals:   04/01/11 1017  BP: 120/72  Pulse: 63  Temp: 97.2 F (36.2 C)  Resp: 20    General: alert, well-developed, and cooperative to examination.  Head: normocephalic and atraumatic.  Eyes: vision grossly intact, pupils equal, pupils round, pupils reactive to light, no injection and anicteric.  Mouth: pharynx pink and moist, no erythema, and no exudates.  Neck: supple, full ROM, no JVD.  Lungs: normal respiratory effort, no accessory muscle use, normal breath sounds, no crackles, and no wheezes. Heart: normal rate, regular rhythm, no murmur, no gallop, and no rub.   Abdomen: Non-tender to palpation in upper quadrants.  Mildly tender to palpation in RLQ, but markedly tender to palpation over L inguinal region.  No  protrusions with bearing down.  Normal bowel sounds, no distention, no guarding, no rebound tenderness.    No palpation protrusions into scrotum with bearing down.    Msk: no joint swelling, no joint warmth, and no redness over joints.  Pulses: 2+ PT pulses bilaterally Extremities: No cyanosis, clubbing, edema Neurologic: alert & oriented X3, cranial nerves II-XII intact, strength normal in all extremities.  Skin: turgor normal and no rashes.  Psych: Oriented X3, memory intact for recent and remote, normally interactive, good eye contact, not anxious appearing, and not depressed appearing.        Assessment & Plan:

## 2011-04-01 NOTE — Assessment & Plan Note (Signed)
Most likely a muscle strain.  No evidence of inguinal hernia on exam today.  Lack of pain with bearing down and position pain are more characteristic of muscle strain over inguinal hernia.  Plan: Call us in two weeks if the pain has not resolved.  Consider referring to general surgery if pain persists. Ibuprofen over the counter 200mg  [id number] for pain.

## 2011-04-01 NOTE — Assessment & Plan Note (Signed)
BP good today.  Continue current BP regimen.

## 2011-04-01 NOTE — Patient Instructions (Signed)
Please call us if your pain is not better in two weeks.  [phone].    You take over the counter ibuprofen 200mg  every 6 hours as needed for pain.

## 2011-06-12 ENCOUNTER — Encounter: Payer: Self-pay | Admitting: Internal Medicine

## 2011-06-12 ENCOUNTER — Ambulatory Visit (INDEPENDENT_AMBULATORY_CARE_PROVIDER_SITE_OTHER): Payer: Self-pay | Admitting: Internal Medicine

## 2011-06-12 VITALS — BP 139/88 | HR 86 | Temp 98.7°F | Ht 63.0 in | Wt 147.4 lb

## 2011-06-12 DIAGNOSIS — F528 Other sexual dysfunction not due to a substance or known physiological condition: Secondary | ICD-10-CM

## 2011-06-12 DIAGNOSIS — E785 Hyperlipidemia, unspecified: Secondary | ICD-10-CM

## 2011-06-12 DIAGNOSIS — K219 Gastro-esophageal reflux disease without esophagitis: Secondary | ICD-10-CM

## 2011-06-12 DIAGNOSIS — M542 Cervicalgia: Secondary | ICD-10-CM

## 2011-06-12 DIAGNOSIS — I10 Essential (primary) hypertension: Secondary | ICD-10-CM

## 2011-06-12 DIAGNOSIS — J449 Chronic obstructive pulmonary disease, unspecified: Secondary | ICD-10-CM

## 2011-06-12 DIAGNOSIS — J019 Acute sinusitis, unspecified: Secondary | ICD-10-CM

## 2011-06-12 DIAGNOSIS — Z23 Encounter for immunization: Secondary | ICD-10-CM

## 2011-06-12 DIAGNOSIS — J309 Allergic rhinitis, unspecified: Secondary | ICD-10-CM

## 2011-06-12 DIAGNOSIS — L309 Dermatitis, unspecified: Secondary | ICD-10-CM

## 2011-06-12 DIAGNOSIS — L259 Unspecified contact dermatitis, unspecified cause: Secondary | ICD-10-CM

## 2011-06-12 LAB — COMPREHENSIVE METABOLIC PANEL
ALT: 53 U/L (ref 0–53)
Albumin: 4.7 g/dL (ref 3.5–5.2)
CO2: 29 mEq/L (ref 19–32)
Calcium: 9.9 mg/dL (ref 8.4–10.5)
Chloride: 98 mEq/L (ref 96–112)
Creat: 0.87 mg/dL (ref 0.50–1.35)
Sodium: 139 mEq/L (ref 135–145)
Total Protein: 7.3 g/dL (ref 6.0–8.3)

## 2011-06-12 MED ORDER — FLUTICASONE-SALMETEROL 250-50 MCG/DOSE IN AEPB: 1.0000 | INHALATION_SPRAY | Freq: Two times a day (BID) | RESPIRATORY_TRACT | Status: DC

## 2011-06-12 MED ORDER — ESOMEPRAZOLE MAGNESIUM 40 MG PO CPDR: 40.0000 mg | DELAYED_RELEASE_CAPSULE | Freq: Every day | ORAL | Status: DC

## 2011-06-12 MED ORDER — CLOBETASOL PROPIONATE 0.05 % EX OINT: TOPICAL_OINTMENT | Freq: Two times a day (BID) | CUTANEOUS | Status: DC | PRN

## 2011-06-12 MED ORDER — ATENOLOL 25 MG PO TABS: 25.0000 mg | ORAL_TABLET | Freq: Every day | ORAL | Status: DC

## 2011-06-12 MED ORDER — SILDENAFIL CITRATE 100 MG PO TABS: 100.0000 mg | ORAL_TABLET | ORAL | Status: DC | PRN

## 2011-06-12 MED ORDER — TRIAMTERENE-HCTZ 37.5-25 MG PO TABS: 1.0000 | ORAL_TABLET | Freq: Every day | ORAL | Status: DC

## 2011-06-12 MED ORDER — ALBUTEROL 90 MCG/ACT IN AERS: 2.0000 | INHALATION_SPRAY | RESPIRATORY_TRACT | Status: DC | PRN

## 2011-06-12 MED ORDER — NAPROXEN 250 MG PO TABS: ORAL_TABLET | ORAL | Status: DC

## 2011-06-12 MED ORDER — PRAVASTATIN SODIUM 40 MG PO TABS: 40.0000 mg | ORAL_TABLET | Freq: Every day | ORAL | Status: DC

## 2011-06-12 MED ORDER — MOMETASONE FUROATE 50 MCG/ACT NA SUSP: 2.0000 | Freq: Every day | NASAL | Status: DC

## 2011-06-12 NOTE — Progress Notes (Signed)
Subjective:   Patient ID: Samuel Chen male   DOB: November 30, 1955 55 y.o.   MRN: [id number]  HPI: [name] is a 55 y.o. male with PMH signifant as outlined below who presented to the clinic for refill of medication, neck pain and signing document for [employer] so he would be abel to get Viagra cheaper. ( I informed the patient that I do not feel comfortable to signing the document and referred it to PCP.)  Neck pain : This has been going on for very long time. He  Has been evaluated by [facility] and received steroid injection in the past. It has improved the pain for a couple of month but he has not been back for a while. Patient noted he lifts a lot of heavy things during work hours and in the evening the pain worsens. Patient noted it is very localized and not associated with tingling or  numbness.  Past Medical History  Diagnosis Date  . GERD (gastroesophageal reflux disease)   . Hypertension   . Hyperlipidemia   . COPD (chronic obstructive pulmonary disease)     Questionable diagnosis.  . 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 Myalgia 11/2007    Likely 2/2 Pravachol  . Dyshidrotic eczema   . Tobacco abuse   . Allergic rhinitis    Current Outpatient Prescriptions  Medication Sig Dispense Refill  . albuterol (PROVENTIL,VENTOLIN) 90 MCG/ACT inhaler Inhale 2 puffs into the lungs every 4 (four) hours as needed for wheezing. Inhale 1 -2 puffs every 4 hours as needed for SOB  17 g  6  . atenolol (TENORMIN) 25 MG tablet Take 25 mg by mouth daily.        . clobetasol (TEMOVATE) 0.05 % ointment Apply topically 2 (two) times daily as needed. Apply to the itchy areas on legs twice a day for seven days       . esomeprazole (NEXIUM) 40 MG capsule Take 1 capsule (40 mg total) by mouth daily.  90 capsule  3  . Fluticasone-Salmeterol (ADVAIR) 250-50 MCG/DOSE AEPB Inhale 1 puff into the lungs 2 (two) times daily.  1 each  6  . mometasone (NASONEX) 50 MCG/ACT nasal spray 2  sprays by Nasal route daily. Each nostril  17 g  6  . pravastatin (PRAVACHOL) 40 MG tablet Take 40 mg by mouth daily.        . sildenafil (VIAGRA) 100 MG tablet Take 1 tablet (100 mg total) by mouth as needed for Erectile Dysfunction.  30 tablet  1  . triamterene-hydrochlorothiazide (MAXZIDE-25) 37.5-25 MG per tablet Take 1 tablet by mouth daily.        . varenicline (CHANTIX) 1 MG tablet Take 1 tablet (1 mg total) by mouth 2 (two) times daily.  56 tablet  3   Family History  Problem Relation Age of Onset  . Heart disease Sister   . Heart attack Sister     34-35yo  . Heart disease Father    Review of Systems: Constitutional: Denies fever, chills, diaphoresis, appetite change and fatigue.  Respiratory: Denies SOB, DOE, cough, chest tightness,  and wheezing.   Cardiovascular: Denies chest pain, palpitations and leg swelling.  Gastrointestinal: Denies nausea, vomiting, abdominal pain, diarrhea, constipation, blood in stool and abdominal distention.  Genitourinary: Denies dysuria, urgency, frequency, hematuria, flank pain and difficulty urinating.  Neurological: Denies dizziness, seizures, syncope, weakness, light-headedness, numbness and headaches.    Objective:  Physical Exam: Filed Vitals:   06/12/11  1405  BP: 139/88  Pulse: 86  Temp: 98.7 F (37.1 C)  TempSrc: Oral  Height: 5\' 3"  (1.6 m)  Weight: 147 lb 6.4 oz (66.86 kg)   Constitutional: Vital signs reviewed.  Patient is a well-developed and well-nourished  in no acute distress and cooperative with exam. Alert and oriented x3.  Neck: Supple, significant tenderness on palpation on left lateral side.  Very stiff. No rash noted.   Cardiovascular: RRR, S1 normal, S2 normal, no MRG, pulses symmetric and intact bilaterally Pulmonary/Chest: CTAB, no wheezes, rales, or rhonchi Abdominal: Soft. Non-tender, non-distended, bowel sounds are normal, no masses, organomegaly, or guarding present.  GU: no CVA tenderness Musculoskeletal: No  joint deformities, erythema, or stiffness, ROM full and no nontender Neurological: A&O x3,no focal motor deficit, sensory intact to light touch bilaterally.

## 2011-06-12 NOTE — Progress Notes (Deleted)
  Subjective:    Patient ID: Samuel Chen, male    DOB: 1956-03-26, 55 y.o.   MRN: [id number]  HPI    Review of Systems     Objective:   Physical Exam        Assessment & Plan:

## 2011-06-16 DIAGNOSIS — M542 Cervicalgia: Secondary | ICD-10-CM | POA: Insufficient documentation

## 2011-06-16 NOTE — Assessment & Plan Note (Signed)
Likley MSK. Will start Naproxen since Ibuprofen is not working. Patient reports nasal bleeding with aspirin. I further advised him of exercises he can do. Patient will be referred to [facility] for further evaluation and management. Patient has a history of arthritis in the neck.

## 2011-06-26 ENCOUNTER — Ambulatory Visit (INDEPENDENT_AMBULATORY_CARE_PROVIDER_SITE_OTHER): Payer: Self-pay | Admitting: Family Medicine

## 2011-06-26 ENCOUNTER — Encounter: Payer: Self-pay | Admitting: Family Medicine

## 2011-06-26 ENCOUNTER — Ambulatory Visit: Payer: Self-pay | Admitting: Family Medicine

## 2011-06-26 VITALS — BP 126/82 | HR 73 | Ht 63.0 in | Wt 145.0 lb

## 2011-06-26 DIAGNOSIS — M25519 Pain in unspecified shoulder: Secondary | ICD-10-CM

## 2011-06-26 MED ORDER — METHYLPREDNISOLONE ACETATE 40 MG/ML IJ SUSP: 40.0000 mg | Freq: Once | INTRAMUSCULAR | Status: DC

## 2011-06-26 MED ORDER — MELOXICAM 15 MG PO TABS: 15.0000 mg | ORAL_TABLET | Freq: Every day | ORAL | Status: DC

## 2011-06-26 NOTE — Assessment & Plan Note (Signed)
Pain is c/w rotator cuff tendonitis.  Strength is normal so a full thickness tear is not likely.  Discontinue OTC ibuprofen and start meloxicam for once daily dosing.  His follow up is PRN.

## 2011-06-26 NOTE — Progress Notes (Signed)
  Subjective:    Patient ID: Samuel Chen, male    DOB: 15-Apr-1956, 55 y.o.   MRN: [id number]  HPI 55 y/o male is here complaining of left shoulder pain.  He had a similar pain about 2 years ago which improved with a steroid injection.  He has no radicular symptoms.  He takes OTC ibuprofen which helps but does not completely resolve the sx's.  He does have night symptoms.  The pain is aggravated with overhead lifting which is required at his job.    Review of Systems     Objective:   Physical Exam Shoulder: Inspection reveals no abnormalities, atrophy or asymmetry. Palpation is normal with no tenderness over AC joint or bicipital groove. ROM is full in all planes. Rotator cuff strength normal throughout. Positive hawkin's tests and empty can. Speeds and Yergason's tests normal. No labral pathology noted with negative Obrien's and good stability. Painful arc but no drop arm sign.  Consent obtained and verified. cleansed with alcohol. Topical analgesic spray: Ethyl chloride. Joint: left shoulder, subacromial Completed without difficulty Meds:40mg  of depo medrol; 3ml of lidocaine without epi Needle:25 g Aftercare instructions and Red flags advised.       Assessment & Plan:

## 2011-07-03 ENCOUNTER — Ambulatory Visit: Payer: Self-pay | Admitting: Family Medicine

## 2011-07-08 ENCOUNTER — Ambulatory Visit (INDEPENDENT_AMBULATORY_CARE_PROVIDER_SITE_OTHER): Payer: Self-pay | Admitting: Internal Medicine

## 2011-07-08 ENCOUNTER — Encounter: Payer: Self-pay | Admitting: Internal Medicine

## 2011-07-08 VITALS — BP 144/80 | HR 80 | Temp 97.9°F | Ht 63.0 in | Wt 141.4 lb

## 2011-07-08 DIAGNOSIS — F17200 Nicotine dependence, unspecified, uncomplicated: Secondary | ICD-10-CM

## 2011-07-08 DIAGNOSIS — M79609 Pain in unspecified limb: Secondary | ICD-10-CM

## 2011-07-08 DIAGNOSIS — N529 Male erectile dysfunction, unspecified: Secondary | ICD-10-CM

## 2011-07-08 DIAGNOSIS — F528 Other sexual dysfunction not due to a substance or known physiological condition: Secondary | ICD-10-CM

## 2011-07-08 DIAGNOSIS — E785 Hyperlipidemia, unspecified: Secondary | ICD-10-CM

## 2011-07-08 DIAGNOSIS — M79606 Pain in leg, unspecified: Secondary | ICD-10-CM

## 2011-07-08 NOTE — Patient Instructions (Signed)
Erectile Dysfunction Erectile dysfunction (ED) is the inability to get a good enough erection to have sexual intercourse. ED may involve:  Inability to get an erection.   Lack of enough hardness to allow penetration.   Loss of the erection before sex is finished.   Premature ejaculation.   Any combination of these problems if they occur more than 25% of the time.  CAUSES  Certain drugs, such as:   Pain relievers.   Antihistamines.   Antidepressants.   Blood pressure medicines.   Water pills.   Ulcer medicines.   Muscle relaxants.   Illegal drugs.   Excessive drinking.   Psychological causes, such as:   Anxiety.   Depression.   Sadness.   Exhaustion.   Performance fear.   Stress.   Physical causes, such as:   Artery problems. This may include diabetes, smoking, liver disease, or atherosclerosis.   High blood pressure.   Hormonal problems, such as low testosterone.   Obesity.   Nerve problems. This may include back or pelvic injuries, diabetes, multiple sclerosis, Parkinson's disease, or some surgeries.  SYMPTOMS  Inability to get an erection.   Lack of enough hardness to allow penetration.   Loss of the erection before sex is finished.   Premature ejaculation.   Normal erections at some times, but with frequent unsatisfactory episodes.   Orgasms that are not satisfactory in sensation or frequency.   Low sexual satisfaction in either partner because of erection problems.   A curved penis occurring with erection. The curve may cause pain or may be too curved to allow for intercourse.   Never having nighttime erections.  DIAGNOSIS Your caregiver can often diagnose this condition by:  Performing a physical exam to find other diseases or specific problems with the penis.   Asking you detailed questions about the problem.   Performing blood tests to check for diabetes or to measure hormone levels.   Performing urine tests to find other  underlying health conditions.   Performing an ultrasound to check for scarring.   Performing a test to check blood flow to the penis.   Doing a sleep study at home to measure nighttime erections.  TREATMENT   You may be prescribed medicines by mouth.   You may be given medicine injections into the penis.   You may be prescribed a vacuum pump with a ring.   Penile implant surgery may be performed. You may receive:   An inflatable implant.   A semi-rigid implant.   Blood vessel surgery may be performed.  HOME CARE INSTRUCTIONS  Take all medicine as directed by your caregiver. Do not take any other medicines without talking to your caregiver first.   Follow your caregiver's directions for specific treatments as prescribed.   Follow up with your caregiver as directed.  Document Released: 08/16/2000 Document Revised: 05/01/2011 Document Reviewed: 12/09/2010 [employer] Patient Information 2012 [employer]. 

## 2011-07-08 NOTE — Assessment & Plan Note (Signed)
Patient does not have complete morning erections which makes psychogenic erectile dysfunction unlikely. Given that patient is complaining of buttock and calf pain and also has a smoking history of more than 30 pack years, atherosclerotic vascular disease is more likely cause of his erectile dysfunction. I will also check TSH and total testosterone levels although the benefit of checking testosterone levels is controversial. I would also obtain ankle brachial index and arterial Dopplers to assess vascular pathology. Patient was also counseled extensively about smoking cessation and alcohol being one of the common causes for her dysfunction.

## 2011-07-08 NOTE — Assessment & Plan Note (Signed)
Counseled extensively. Patient is currently taking Chantix and says that he has cut down his smoking from a pack a day to about three quarters.

## 2011-07-08 NOTE — Progress Notes (Signed)
  Subjective:    Patient ID: Samuel Chen, male    DOB: 1955-10-06, 55 y.o.   MRN: [id number]  HPI Samuel Chen is a 55 year old man with past medical history of hyperlipidemia, erectile dysfunction, alcohol abuse and tobacco abuse who comes in today with chief complaint of erectile dysfunction.  Agencies that he has been taking Viagra but it does not work anymore. Patient denies having complete morning erections although he did say that he has mild erections in the morning. Patient denies any recent trauma to the genitalia. Continues to smoke three-quarter of a pack of cigarettes a day and also drinks 5-6 bottles of beer. Patient is compliant with all his blood pressure medications. He has not seen any specialist in the past. Patient complained of pain in his buttocks and calf while walking extended distances.    Review of Systems  Constitutional: Negative for fever, activity change and appetite change.  HENT: Negative for sore throat.   Respiratory: Negative for cough and shortness of breath.   Cardiovascular: Negative for chest pain and leg swelling.  Gastrointestinal: Negative for nausea, abdominal pain, diarrhea, constipation and abdominal distention.  Genitourinary: Negative for frequency, hematuria and difficulty urinating.  Neurological: Negative for dizziness and headaches.  Psychiatric/Behavioral: Negative for suicidal ideas and behavioral problems.       Objective:   Physical Exam  Constitutional: He is oriented to person, place, and time. He appears well-developed and well-nourished.  HENT:  Head: Normocephalic and atraumatic.  Eyes: Conjunctivae and EOM are normal. Pupils are equal, round, and reactive to light. No scleral icterus.  Neck: Normal range of motion. Neck supple. No JVD present. No thyromegaly present.  Cardiovascular: Normal rate, regular rhythm, normal heart sounds and intact distal pulses.  Exam reveals no gallop and no friction rub.   No murmur  heard. Pulses:      Carotid pulses are 3+ on the right side, and 3+ on the left side.      Radial pulses are 3+ on the right side, and 3+ on the left side.       Femoral pulses are 2+ on the right side, and 2+ on the left side.      Popliteal pulses are 1+ on the right side, and 1+ on the left side.       Dorsalis pedis pulses are 1+ on the right side, and 1+ on the left side.       Posterior tibial pulses are 1+ on the right side, and 1+ on the left side.  Pulmonary/Chest: Effort normal and breath sounds normal. No respiratory distress. He has no wheezes. He has no rales.  Abdominal: Soft. Bowel sounds are normal. He exhibits no distension and no mass. There is no tenderness. There is no rebound and no guarding.  Musculoskeletal: Normal range of motion. He exhibits no edema and no tenderness.  Lymphadenopathy:    He has no cervical adenopathy.  Neurological: He is alert and oriented to person, place, and time.  Psychiatric: He has a normal mood and affect. His behavior is normal.          Assessment & Plan:

## 2011-07-09 LAB — TESTOSTERONE, FREE, TOTAL, SHBG: Testosterone: 496 ng/dL (ref 250–890)

## 2011-07-11 ENCOUNTER — Ambulatory Visit (HOSPITAL_COMMUNITY)
Admission: RE | Admit: 2011-07-11 | Discharge: 2011-07-11 | Disposition: A | Discharge status: Home / Self Care | Payer: Self-pay | Source: Ambulatory Visit | Attending: Internal Medicine | Admitting: Internal Medicine

## 2011-07-11 DIAGNOSIS — M79609 Pain in unspecified limb: Secondary | ICD-10-CM | POA: Insufficient documentation

## 2011-07-11 DIAGNOSIS — M79606 Pain in leg, unspecified: Secondary | ICD-10-CM

## 2011-07-11 NOTE — Progress Notes (Addendum)
Bilateral lower extremity arterial duplex with ABI completed.  Preliminary report is ABI is greater than 1.0 with abnormal Doppler waveforms in the dorsalis pedis bilaterally.  No evidence of significant plaque or stenosis noted throughout. Smiley Houseman 07/11/2011, 3:26 PM

## 2011-07-22 ENCOUNTER — Ambulatory Visit (INDEPENDENT_AMBULATORY_CARE_PROVIDER_SITE_OTHER): Payer: Self-pay | Admitting: Internal Medicine

## 2011-07-22 ENCOUNTER — Encounter: Payer: Self-pay | Admitting: Internal Medicine

## 2011-07-22 VITALS — BP 141/86 | HR 79 | Temp 97.2°F | Wt 145.3 lb

## 2011-07-22 DIAGNOSIS — J019 Acute sinusitis, unspecified: Secondary | ICD-10-CM

## 2011-07-22 DIAGNOSIS — K219 Gastro-esophageal reflux disease without esophagitis: Secondary | ICD-10-CM

## 2011-07-22 DIAGNOSIS — J449 Chronic obstructive pulmonary disease, unspecified: Secondary | ICD-10-CM

## 2011-07-22 DIAGNOSIS — E785 Hyperlipidemia, unspecified: Secondary | ICD-10-CM

## 2011-07-22 DIAGNOSIS — F528 Other sexual dysfunction not due to a substance or known physiological condition: Secondary | ICD-10-CM

## 2011-07-22 DIAGNOSIS — L309 Dermatitis, unspecified: Secondary | ICD-10-CM

## 2011-07-22 DIAGNOSIS — R6889 Other general symptoms and signs: Secondary | ICD-10-CM

## 2011-07-22 DIAGNOSIS — I10 Essential (primary) hypertension: Secondary | ICD-10-CM

## 2011-07-22 DIAGNOSIS — L259 Unspecified contact dermatitis, unspecified cause: Secondary | ICD-10-CM

## 2011-07-22 MED ORDER — FLUTICASONE-SALMETEROL 250-50 MCG/DOSE IN AEPB: 1.0000 | INHALATION_SPRAY | Freq: Two times a day (BID) | RESPIRATORY_TRACT | Status: DC

## 2011-07-22 MED ORDER — TRIAMTERENE-HCTZ 37.5-25 MG PO TABS: 1.0000 | ORAL_TABLET | Freq: Every day | ORAL | Status: DC

## 2011-07-22 MED ORDER — IBUPROFEN 200 MG PO TABS: 400.0000 mg | ORAL_TABLET | Freq: Four times a day (QID) | ORAL | Status: AC | PRN

## 2011-07-22 MED ORDER — MOMETASONE FUROATE 50 MCG/ACT NA SUSP: 2.0000 | Freq: Every day | NASAL | Status: DC

## 2011-07-22 MED ORDER — ALBUTEROL 90 MCG/ACT IN AERS: 2.0000 | INHALATION_SPRAY | RESPIRATORY_TRACT | Status: DC | PRN

## 2011-07-22 MED ORDER — PRAVASTATIN SODIUM 40 MG PO TABS: 40.0000 mg | ORAL_TABLET | Freq: Every day | ORAL | Status: DC

## 2011-07-22 MED ORDER — CLOBETASOL PROPIONATE 0.05 % EX OINT: TOPICAL_OINTMENT | Freq: Two times a day (BID) | CUTANEOUS | Status: DC | PRN

## 2011-07-22 MED ORDER — ATENOLOL 25 MG PO TABS: 25.0000 mg | ORAL_TABLET | Freq: Every day | ORAL | Status: DC

## 2011-07-22 MED ORDER — SILDENAFIL CITRATE 100 MG PO TABS: 100.0000 mg | ORAL_TABLET | ORAL | Status: DC | PRN

## 2011-07-22 MED ORDER — ESOMEPRAZOLE MAGNESIUM 40 MG PO CPDR: 40.0000 mg | DELAYED_RELEASE_CAPSULE | Freq: Every day | ORAL | Status: DC

## 2011-07-22 NOTE — Assessment & Plan Note (Signed)
Testosterone, TSH and vascular studies are normal so far. I will refer the patient to urologist for further management. Patient was counseled extensively for smoking and alcohol cessation.

## 2011-07-22 NOTE — Progress Notes (Signed)
  Subjective:    Patient ID: Samuel Chen, male    DOB: 07/02/1956, 55 y.o.   MRN: [id number]  HPI Mr. Oyama is a 55 year old man with past medical history as noted in the chart.  He is here today for followup of his erectile dysfunction. He states the problem is still the same. He is trying to cut back on his cigarettes and alcohol and is only smoking about 4-5 cigarettes a day now. We had done few preliminary tests at last office visit and patient wants to know the results.  Patient is complaining of flulike symptoms for the last 2 days. Cough, headaches, body aches and brown colored sputum.  No change in appetite or bowel movements. No sick contacts noted no fever or chills noted. No wheezing noted.  Patient has brought in paperwork for Viagra tablets so that he could get his free supply .      Review of Systems  Constitutional: Positive for fatigue. Negative for fever, activity change and appetite change.  HENT: Negative for sore throat.   Respiratory: Positive for cough. Negative for shortness of breath and wheezing.   Cardiovascular: Negative for chest pain and leg swelling.  Gastrointestinal: Negative for nausea, abdominal pain, diarrhea, constipation and abdominal distention.  Genitourinary: Negative for frequency, hematuria and difficulty urinating.  Neurological: Negative for dizziness and headaches.  Psychiatric/Behavioral: Negative for suicidal ideas and behavioral problems.       Objective:   Physical Exam  Constitutional: He is oriented to person, place, and time. He appears well-developed and well-nourished.  HENT:  Head: Normocephalic and atraumatic.  Eyes: Conjunctivae and EOM are normal. Pupils are equal, round, and reactive to light. No scleral icterus.  Neck: Normal range of motion. Neck supple. No JVD present. No thyromegaly present.  Cardiovascular: Normal rate, regular rhythm, normal heart sounds and intact distal pulses.  Exam reveals no gallop and no  friction rub.   No murmur heard. Pulmonary/Chest: Effort normal and breath sounds normal. No respiratory distress. He has no wheezes. He has no rales.  Abdominal: Soft. Bowel sounds are normal. He exhibits no distension and no mass. There is no tenderness. There is no rebound and no guarding.  Musculoskeletal: Normal range of motion. He exhibits no edema and no tenderness.  Lymphadenopathy:    He has no cervical adenopathy.  Neurological: He is alert and oriented to person, place, and time.  Psychiatric: He has a normal mood and affect. His behavior is normal.          Assessment & Plan:

## 2011-07-22 NOTE — Patient Instructions (Signed)

## 2011-07-22 NOTE — Assessment & Plan Note (Signed)
Patient was given general instructions like hydration, steam inhalation, avoid smoking, look out for fevers and chills and was also given ibuprofen every 6 hours for next 3 days for generalized body ache. This should relieve the symptoms. Patient was also advised to take over-the-counter cough suppressant as needed.

## 2011-07-30 ENCOUNTER — Other Ambulatory Visit: Payer: Self-pay | Admitting: Internal Medicine

## 2011-08-02 ENCOUNTER — Encounter: Payer: Self-pay | Admitting: Critical Care Medicine

## 2011-08-02 ENCOUNTER — Ambulatory Visit (INDEPENDENT_AMBULATORY_CARE_PROVIDER_SITE_OTHER): Payer: Self-pay | Admitting: Critical Care Medicine

## 2011-08-02 DIAGNOSIS — J449 Chronic obstructive pulmonary disease, unspecified: Secondary | ICD-10-CM

## 2011-08-02 MED ORDER — PREDNISONE 10 MG PO TABS: ORAL_TABLET | ORAL | Status: DC

## 2011-08-02 MED ORDER — AZITHROMYCIN 250 MG PO TABS: ORAL_TABLET | ORAL | Status: DC

## 2011-08-02 NOTE — Progress Notes (Signed)
Subjective:    Patient ID: Samuel Chen, male    DOB: Jan 06, 1956, 55 y.o.   MRN: [id number] 55 y.o.WM Smoker referred for cough and dyspnea, Hx as below: HPI 6/12 Cough and dyspnea is better. Notes burning on the tongue when eats with Advair.   Notes sl amount of mucus.   Notes no real wheeze.  No real chest pain.   No pndrip.  11/30 Pt still smoking .  On chantix.  Notes cough for one week and more dyspnea. No chest pain,  Notes some wheeze at night Past Medical History  Diagnosis Date  . GERD (gastroesophageal reflux disease)   . Hypertension   . Hyperlipidemia   . COPD (chronic obstructive pulmonary disease)     Questionable diagnosis.  . 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 Myalgia 11/2007    Likely 2/2 Pravachol  . Dyshidrotic eczema   . Tobacco abuse   . Allergic rhinitis      Family History  Problem Relation Age of Onset  . Heart disease Sister   . Heart attack Sister     34-35yo  . Heart disease Father      History   Social History  . Marital Status: Single    Spouse Name: N/A    Number of Children: N/A  . Years of Education: N/A   Occupational History  . Not on file.   Social History Main Topics  . Smoking status: Current Everyday Smoker -- 0.3 packs/day for 40 years    Types: Cigarettes  . Smokeless tobacco: Never Used   Comment: chantix still being taken  . Alcohol Use: Yes     6 to 7 beers daily x 25 years at least   . Drug Use: No  . Sexually Active: Yes   Other Topics Concern  . Not on file   Social History Narrative   Financial assistance approved for 100% discount at [facility] and has [facility] card per Rudell Cobb October 13,2011 4:21PM     Allergies  Allergen Reactions  . Acetaminophen     REACTION: (causes liver problems)  . Aspirin     REACTION: bleeding/nose bleeds     Outpatient Prescriptions Prior to Visit  Medication Sig Dispense Refill  . albuterol (PROVENTIL,VENTOLIN) 90 MCG/ACT inhaler Inhale 2 puffs into  the lungs every 4 (four) hours as needed for wheezing. Inhale 1 -2 puffs every 4 hours as needed for SOB  17 g  11  . atenolol (TENORMIN) 25 MG tablet Take 1 tablet (25 mg total) by mouth daily.  90 tablet  3  . clobetasol ointment (TEMOVATE) 0.05 % Apply topically 2 (two) times daily as needed. Apply to the itchy areas on legs twice a day for seven days  60 g  3  . esomeprazole (NEXIUM) 40 MG capsule Take 1 capsule (40 mg total) by mouth daily.  90 capsule  3  . Fluticasone-Salmeterol (ADVAIR) 250-50 MCG/DOSE AEPB Inhale 1 puff into the lungs 2 (two) times daily.  1 each  11  . mometasone (NASONEX) 50 MCG/ACT nasal spray Place 2 sprays into the nose daily. Each nostril  17 g  3  . pravastatin (PRAVACHOL) 40 MG tablet Take 1 tablet (40 mg total) by mouth daily.  90 tablet  3  . sildenafil (VIAGRA) 100 MG tablet Take 1 tablet (100 mg total) by mouth as needed for erectile dysfunction.  30 tablet  0  . triamterene-hydrochlorothiazide (MAXZIDE-25) 37.5-25 MG per tablet  Take 1 each (1 tablet total) by mouth daily.  90 tablet  3      Review of Systems  Constitutional: Negative for unexpected weight change.  HENT: Negative for nosebleeds, congestion, sneezing, trouble swallowing, dental problem and sinus pressure.   Eyes: Negative for redness and itching.  Respiratory: Negative for chest tightness.   Cardiovascular: Negative for palpitations.  Gastrointestinal: Negative for nausea.  Genitourinary: Negative for dysuria.  Musculoskeletal: Negative for joint swelling.  Hematological: Does not bruise/bleed easily.  Psychiatric/Behavioral: Negative for dysphoric mood. The patient is not nervous/anxious.        Objective:   Physical Exam  Filed Vitals:   08/02/11 1435  BP: 128/80  Pulse: 67  Temp: 98.7 F (37.1 C)  TempSrc: Oral  Height: 5\' 3"  (1.6 m)  Weight: 65.953 kg (145 lb 6.4 oz)  SpO2: 98%    Gen: Pleasant, well-nourished, in no distress,  normal affect  ENT: No lesions,  mouth  clear,  oropharynx clear, no postnasal drip  Neck: No JVD, no TMG, no carotid bruits  Lungs: No use of accessory muscles, no dullness to percussion, exp wheezes,  Poor airflow   Cardiovascular: RRR, heart sounds normal, no murmur or gallops, no peripheral edema  Abdomen: soft and NT, no HSM,  BS normal  Musculoskeletal: No deformities, no cyanosis or clubbing  Neuro: alert, non focal  Skin: Warm, no lesions or rashes     01/09/11 CXR: Findings: Heart and mediastinal contours are within normal limits. No focal opacities or effusions. No acute bony abnormality.  IMPRESSION: No acute findings   Assessment & Plan:   COPD (chronic obstructive pulmonary disease) Copd with acute bronchitis and ongoing tobacco use Plan Take prednisone 10mg  Take 4 for three days 3 for three days 2 for three days 1 for three days and stop Take azithromycin 250mg  Take two once then one daily until gone No other medication changes Focus on stop smoking Return 3 months       Updated Medication List Outpatient Encounter Prescriptions as of 08/02/2011  Medication Sig Dispense Refill  . albuterol (PROVENTIL,VENTOLIN) 90 MCG/ACT inhaler Inhale 2 puffs into the lungs every 4 (four) hours as needed for wheezing. Inhale 1 -2 puffs every 4 hours as needed for SOB  17 g  11  . atenolol (TENORMIN) 25 MG tablet Take 1 tablet (25 mg total) by mouth daily.  90 tablet  3  . clobetasol ointment (TEMOVATE) 0.05 % Apply topically 2 (two) times daily as needed. Apply to the itchy areas on legs twice a day for seven days  60 g  3  . esomeprazole (NEXIUM) 40 MG capsule Take 1 capsule (40 mg total) by mouth daily.  90 capsule  3  . Fluticasone-Salmeterol (ADVAIR) 250-50 MCG/DOSE AEPB Inhale 1 puff into the lungs 2 (two) times daily.  1 each  11  . mometasone (NASONEX) 50 MCG/ACT nasal spray Place 2 sprays into the nose daily. Each nostril  17 g  3  . pravastatin (PRAVACHOL) 40 MG tablet Take 1 tablet (40 mg total) by  mouth daily.  90 tablet  3  . sildenafil (VIAGRA) 100 MG tablet Take 1 tablet (100 mg total) by mouth as needed for erectile dysfunction.  30 tablet  0  . triamterene-hydrochlorothiazide (MAXZIDE-25) 37.5-25 MG per tablet Take 1 each (1 tablet total) by mouth daily.  90 tablet  3  . varenicline (CHANTIX) 1 MG tablet Take 1 mg by mouth 2 (two) times daily.        Marland Kitchen  azithromycin (ZITHROMAX) 250 MG tablet Take two once then one daily until gone  6 each  0  . predniSONE (DELTASONE) 10 MG tablet Take 4 for three days 3 for three days 2 for three days 1 for three days and stop  30 tablet  0

## 2011-08-02 NOTE — Patient Instructions (Addendum)
Take prednisone 10mg  Take 4 for three days 3 for three days 2 for three days 1 for three days and stop Take azithromycin 250mg  Take two once then one daily until gone No other medication changes Focus on stop smoking Return 3 months

## 2011-08-04 NOTE — Assessment & Plan Note (Signed)
Copd with acute bronchitis and ongoing tobacco use Plan Take prednisone 10mg  Take 4 for three days 3 for three days 2 for three days 1 for three days and stop Take azithromycin 250mg  Take two once then one daily until gone No other medication changes Focus on stop smoking Return 3 months

## 2011-09-12 ENCOUNTER — Ambulatory Visit (INDEPENDENT_AMBULATORY_CARE_PROVIDER_SITE_OTHER): Payer: Self-pay | Admitting: Internal Medicine

## 2011-09-12 ENCOUNTER — Encounter: Payer: Self-pay | Admitting: Internal Medicine

## 2011-09-12 DIAGNOSIS — K219 Gastro-esophageal reflux disease without esophagitis: Secondary | ICD-10-CM

## 2011-09-12 DIAGNOSIS — J309 Allergic rhinitis, unspecified: Secondary | ICD-10-CM

## 2011-09-12 DIAGNOSIS — F17200 Nicotine dependence, unspecified, uncomplicated: Secondary | ICD-10-CM

## 2011-09-12 DIAGNOSIS — I10 Essential (primary) hypertension: Secondary | ICD-10-CM

## 2011-09-12 DIAGNOSIS — F528 Other sexual dysfunction not due to a substance or known physiological condition: Secondary | ICD-10-CM

## 2011-09-12 DIAGNOSIS — J019 Acute sinusitis, unspecified: Secondary | ICD-10-CM

## 2011-09-12 DIAGNOSIS — M25519 Pain in unspecified shoulder: Secondary | ICD-10-CM

## 2011-09-12 DIAGNOSIS — E785 Hyperlipidemia, unspecified: Secondary | ICD-10-CM

## 2011-09-12 DIAGNOSIS — F1010 Alcohol abuse, uncomplicated: Secondary | ICD-10-CM

## 2011-09-12 MED ORDER — LISINOPRIL-HYDROCHLOROTHIAZIDE 20-25 MG PO TABS: 1.0000 | ORAL_TABLET | Freq: Every day | ORAL | Status: DC

## 2011-09-12 MED ORDER — MOMETASONE FUROATE 50 MCG/ACT NA SUSP: 2.0000 | Freq: Every day | NASAL | Status: DC

## 2011-09-12 MED ORDER — ESOMEPRAZOLE MAGNESIUM 40 MG PO CPDR: 40.0000 mg | DELAYED_RELEASE_CAPSULE | Freq: Every day | ORAL | Status: DC

## 2011-09-12 MED ORDER — SILDENAFIL CITRATE 100 MG PO TABS: 100.0000 mg | ORAL_TABLET | ORAL | Status: DC | PRN

## 2011-09-12 MED ORDER — PRAVASTATIN SODIUM 40 MG PO TABS: 40.0000 mg | ORAL_TABLET | Freq: Every day | ORAL | Status: DC

## 2011-09-12 MED ORDER — MELOXICAM 15 MG PO TABS: 15.0000 mg | ORAL_TABLET | Freq: Every day | ORAL | Status: DC

## 2011-09-12 NOTE — Patient Instructions (Signed)
Return to clinic in 1 month

## 2011-09-15 ENCOUNTER — Encounter: Payer: Self-pay | Admitting: Internal Medicine

## 2011-09-15 NOTE — Assessment & Plan Note (Signed)
Refilled nasonex. 

## 2011-09-15 NOTE — Progress Notes (Signed)
Subjective:   Patient ID: Samuel Chen male   DOB: 09-23-1955 56 y.o.   MRN: [id number]  HPI: [name] is a 56 y.o. with history of HTN, COPD, erectile dysfunction here for follow-up.  He is still having problems with erectile dysfunction.  He has some morning erections but they are usually not full.  However, on occasion he does get a full erection during intercourse.  Viagra helps a little.  He says he is compliant with his BP meds.  He smokes 10-20 cigarettes per day.  He has been on Chantix for many months without success quitting.  He is not willing to set a quit date.  He is drinking 4 beers per day.  He is not interested in cutting down.    Past Medical History  Diagnosis Date  . GERD (gastroesophageal reflux disease)   . Hypertension   . Hyperlipidemia   . COPD (chronic obstructive pulmonary disease)     Questionable diagnosis.  . 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 Myalgia 11/2007    Likely 2/2 Pravachol  . Dyshidrotic eczema   . Tobacco abuse   . Allergic rhinitis    Current Outpatient Prescriptions  Medication Sig Dispense Refill  . albuterol (PROVENTIL,VENTOLIN) 90 MCG/ACT inhaler Inhale 2 puffs into the lungs every 4 (four) hours as needed for wheezing. Inhale 1 -2 puffs every 4 hours as needed for SOB  17 g  11  . azithromycin (ZITHROMAX) 250 MG tablet Take two once then one daily until gone  6 each  0  . clobetasol ointment (TEMOVATE) 0.05 % Apply topically 2 (two) times daily as needed. Apply to the itchy areas on legs twice a day for seven days  60 g  3  . esomeprazole (NEXIUM) 40 MG capsule Take 1 capsule (40 mg total) by mouth daily.  90 capsule  3  . Fluticasone-Salmeterol (ADVAIR) 250-50 MCG/DOSE AEPB Inhale 1 puff into the lungs 2 (two) times daily.  1 each  11  . lisinopril-hydrochlorothiazide (PRINZIDE,ZESTORETIC) 20-25 MG per tablet Take 1 tablet by mouth daily.  30 tablet  3  . meloxicam (MOBIC) 15 MG tablet Take 1 tablet (15  mg total) by mouth daily.  30 tablet  0  . mometasone (NASONEX) 50 MCG/ACT nasal spray Place 2 sprays into the nose daily. Each nostril  17 g  3  . pravastatin (PRAVACHOL) 40 MG tablet Take 1 tablet (40 mg total) by mouth daily.  90 tablet  3  . predniSONE (DELTASONE) 10 MG tablet Take 4 for three days 3 for three days 2 for three days 1 for three days and stop  30 tablet  0  . sildenafil (VIAGRA) 100 MG tablet Take 1 tablet (100 mg total) by mouth as needed for erectile dysfunction.  30 tablet  0  . varenicline (CHANTIX) 1 MG tablet Take 1 mg by mouth 2 (two) times daily.         Family History  Problem Relation Age of Onset  . Heart disease Sister   . Heart attack Sister     34-35yo  . Heart disease Father    History   Social History  . Marital Status: Single    Spouse Name: N/A    Number of Children: N/A  . Years of Education: N/A   Social History Main Topics  . Smoking status: Current Everyday Smoker -- 0.3 packs/day for 40 years    Types: Cigarettes  . Smokeless  tobacco: Never Used   Comment: chantix still being taken  . Alcohol Use: Yes     6 to 7 beers daily x 25 years at least   . Drug Use: No  . Sexually Active: Yes   Other Topics Concern  . None   Social History Narrative   Financial assistance approved for 100% discount at [facility] and has [facility] card per [employer] October 13,2011 4:21PM   Review of Systems: Constitutional: Denies fever, chills, diaphoresis, appetite change and fatigue.  Respiratory: Denies SOB, worse DOE, cough, chest tightness, or wheezing.   Cardiovascular: Denies chest pain, palpitations and leg swelling.  Gastrointestinal: Denies nausea, vomiting, abdominal pain, diarrhea, constipation, blood in stool and abdominal distention.  Genitourinary: Denies dysuria, urgency, frequency, hematuria, flank pain and difficulty urinating.  Musculoskeletal: Shoulder pain is at baseline.  Neurological: Denies dizziness, seizures, syncope, weakness,  light-headedness, numbness and headaches.    Objective:  Physical Exam: Filed Vitals:   09/12/11 0845 09/12/11 0919  BP: 149/82 146/82  Pulse: 79   Temp: 97.8 F (36.6 C)   TempSrc: Oral   Height: 5\' 3"  (1.6 m)   Weight: 141 lb 3.2 oz (64.048 kg)   SpO2: 100%    Constitutional: Vital signs reviewed.  Patient is a well-developed and well-nourished man in no acute distress and cooperative with exam. Alert and oriented x3.  Head: Normocephalic and atraumatic Mouth: no erythema or exudates, MMM Eyes: PERRL, EOMI, conjunctivae normal, No scleral icterus.  Neck: No JVD  Cardiovascular: RRR, S1 normal, S2 normal, no MRG, pulses symmetric and intact bilaterally Pulmonary/Chest: CTAB, no wheezes, rales, or rhonchi  Neurological: A&O x3, Strength is normal and symmetric bilaterally, cranial nerve II-XII are grossly intact, no focal motor deficit, sensory intact to light touch bilaterally.  Skin: Warm, dry and intact. No rash, cyanosis, or clubbing.  Assessment & Plan:

## 2011-09-15 NOTE — Assessment & Plan Note (Signed)
Counseled patient on risks of his drinking.  He does not want to quit at this time.

## 2011-09-15 NOTE — Assessment & Plan Note (Addendum)
Will discontinue atenolol to see if it is a contributor to his erectile dysfunction.  Will change HCTZ-Triamterene to HCTZ-Lisinopril.  Follow-up one month for a BP check on his new regimen.

## 2011-09-15 NOTE — Assessment & Plan Note (Signed)
Discussed cessation with patient.  He wants to be a non-smoker, but he is not committed to stopping fully.  Will stop Chantix for now.  Told patient to call us if he wants to quit, and we will start Chantix again 1 week before his quit date.

## 2011-09-15 NOTE — Assessment & Plan Note (Signed)
Ankle brachial indexes were normal at last visit, making vascular etiology less likely.  Patient does say today that he has full erections sometimes.  It seems likely that his alcohol use (4 beers per day) is contributing.  Encouraged him to stop drinking, but did not want to.  Atenolol could be contributing.  Timing of beginning of his ED problems seems to correlate with start of beta blocker two years ago.  Discontinued atenolol.

## 2011-09-15 NOTE — Assessment & Plan Note (Signed)
Injection helped in the past.  Will refill mobic today.

## 2011-09-16 NOTE — Progress Notes (Signed)
I agree with Dr. Wainwright's assessment and plan. 

## 2011-10-14 ENCOUNTER — Encounter: Payer: Self-pay | Admitting: Internal Medicine

## 2011-10-14 ENCOUNTER — Ambulatory Visit (INDEPENDENT_AMBULATORY_CARE_PROVIDER_SITE_OTHER): Payer: Self-pay | Admitting: Internal Medicine

## 2011-10-14 VITALS — BP 121/82 | HR 97 | Temp 98.2°F | Wt 143.2 lb

## 2011-10-14 DIAGNOSIS — I10 Essential (primary) hypertension: Secondary | ICD-10-CM

## 2011-10-14 DIAGNOSIS — N529 Male erectile dysfunction, unspecified: Secondary | ICD-10-CM

## 2011-10-14 DIAGNOSIS — E785 Hyperlipidemia, unspecified: Secondary | ICD-10-CM

## 2011-10-14 DIAGNOSIS — F17200 Nicotine dependence, unspecified, uncomplicated: Secondary | ICD-10-CM

## 2011-10-14 DIAGNOSIS — F528 Other sexual dysfunction not due to a substance or known physiological condition: Secondary | ICD-10-CM

## 2011-10-14 DIAGNOSIS — J449 Chronic obstructive pulmonary disease, unspecified: Secondary | ICD-10-CM

## 2011-10-14 LAB — BASIC METABOLIC PANEL WITH GFR
BUN: 19 mg/dL (ref 6–23)
CO2: 26 mEq/L (ref 19–32)
Chloride: 96 mEq/L (ref 96–112)
GFR, Est Non African American: 81 mL/min
Potassium: 4.5 mEq/L (ref 3.5–5.3)

## 2011-10-14 LAB — LIPID PANEL
Cholesterol: 184 mg/dL (ref 0–200)
VLDL: 23 mg/dL (ref 0–40)

## 2011-10-14 NOTE — Assessment & Plan Note (Signed)
his bp is well controlled after discontinue Atenolol and started Prinzide. Bp is 121/82 today. Will check his BMP to assure his kidney function is OK.

## 2011-10-14 NOTE — Assessment & Plan Note (Signed)
this problem improved after discontinued atenolol. He is happy about. Will continue current regimen and follow up.

## 2011-10-14 NOTE — Progress Notes (Signed)
I discussed patient with resident Dr. Niu, and I agree with the plans as outlined in his note. 

## 2011-10-14 NOTE — Patient Instructions (Signed)
1. Please take all medications as prescribed.  2. If you have worsening of your symptoms or new symptoms arise, please call the clinic ([phone]), or go to the ER immediately if symptoms are severe. 

## 2011-10-14 NOTE — Assessment & Plan Note (Signed)
  His last lipid panel showed LDL was 73 on 06/25/10. He is on Pravastatin. He did not notice side effects, such as muscle pain. His AST and ALT was normal on 06/11/10. Will check his Lipid panel today.

## 2011-10-14 NOTE — Progress Notes (Signed)
Subjective:   Patient ID: Samuel Chen male   DOB: 10-29-1955 56 y.o.   MRN: [id number]  HPI:  [name] is a 56 y.o. with history of HTN, COPD, erectile dysfunction here for follow-up after his blood pressure medication change. Patient's atenolol was discontinued due to erectile dysfunction on 09/12/11 and Prinzide was started on the same day. Today he reports that his erectile dysfunction improved. No any other complaints. His bp is 121/82 mmHg today.  Denies fever, chills, fatigue, headaches, cough, chest pain, SOB,  abdominal pain,diarrhea, constipation, dysuria, urgency, frequency, hematuria, or leg swelling.  Past Medical History  Diagnosis Date  . GERD (gastroesophageal reflux disease)   . Hypertension   . Hyperlipidemia   . COPD (chronic obstructive pulmonary disease)     Questionable diagnosis.  . 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 Myalgia 11/2007    Likely 2/2 Pravachol  . Dyshidrotic eczema   . Tobacco abuse   . Allergic rhinitis    Current Outpatient Prescriptions  Medication Sig Dispense Refill  . albuterol (PROVENTIL,VENTOLIN) 90 MCG/ACT inhaler Inhale 2 puffs into the lungs every 4 (four) hours as needed for wheezing. Inhale 1 -2 puffs every 4 hours as needed for SOB  17 g  11  . clobetasol ointment (TEMOVATE) 0.05 % Apply topically 2 (two) times daily as needed. Apply to the itchy areas on legs twice a day for seven days  60 g  3  . esomeprazole (NEXIUM) 40 MG capsule Take 1 capsule (40 mg total) by mouth daily.  90 capsule  3  . Fluticasone-Salmeterol (ADVAIR) 250-50 MCG/DOSE AEPB Inhale 1 puff into the lungs 2 (two) times daily.  1 each  11  . lisinopril-hydrochlorothiazide (PRINZIDE,ZESTORETIC) 20-25 MG per tablet Take 1 tablet by mouth daily.  30 tablet  3  . meloxicam (MOBIC) 15 MG tablet Take 1 tablet (15 mg total) by mouth daily.  30 tablet  0  . mometasone (NASONEX) 50 MCG/ACT nasal spray Place 2 sprays into the nose daily. Each  nostril  17 g  3  . sildenafil (VIAGRA) 100 MG tablet Take 1 tablet (100 mg total) by mouth as needed for erectile dysfunction.  30 tablet  0  . varenicline (CHANTIX) 1 MG tablet Take 1 mg by mouth 2 (two) times daily.        . predniSONE (DELTASONE) 10 MG tablet Take 4 for three days 3 for three days 2 for three days 1 for three days and stop  30 tablet  0   Family History  Problem Relation Age of Onset  . Heart disease Sister   . Heart attack Sister     34-35yo  . Heart disease Father    History   Social History  . Marital Status: Single    Spouse Name: N/A    Number of Children: N/A  . Years of Education: N/A   Social History Main Topics  . Smoking status: Current Everyday Smoker -- 0.3 packs/day for 40 years    Types: Cigarettes  . Smokeless tobacco: Never Used   Comment: chantix still being taken  . Alcohol Use: Yes     6 to 7 beers daily x 25 years at least   . Drug Use: No  . Sexually Active: Yes   Other Topics Concern  . None   Social History Narrative   Financial assistance approved for 100% discount at [facility] and has [facility] card per [employer] October 13,2011  4:21PM   Review of Systems:  General: no fevers, chills, no changes in body weight, no changes in appetite Skin: no rash HEENT: no blurry vision, hearing changes or sore throat Pulm: no dyspnea, coughing, wheezing CV: no chest pain, palpitations, shortness of breath Abd: no nausea/vomiting, abdominal pain, diarrhea/constipation GU: no dysuria, hematuria, polyuria Ext: has mild shoulder pain. Neuro: no weakness, numbness, or tingling  Objective:  Physical Exam: Filed Vitals:   10/14/11 0904  BP: 121/82  Pulse: 97  Temp: 98.2 F (36.8 C)  TempSrc: Oral  Weight: 143 lb 3.2 oz (64.955 kg)   General: resting in bed, not in acute distress HEENT: PERRL, EOMI, no scleral icterus Cardiac: S1/S2, RRR, No murmurs, gallops or rubs Pulm: Good air movement bilaterally, mild wheezing on the right lower  lobe. No rales, wheezing, rhonchi or rubs. Abd: Soft,  nondistended, nontender, no rebound pain, no organomegaly, BS present Ext: No rashes or edema, 2+DP/PT pulse bilaterally Neuro: alert and oriented X3, cranial nerves II-XII grossly intact, muscle strength 5/5 in all extremeties,  sensation to light touch intact.   Assessment & Plan:   #. HTN: his bp is well controlled after discontinue Atenolol and started Prinzide. Bp is 121/82 today. Will check his BMP to assure his kidney function is OK.  # HLD: his last lipid panel showed LDL was 73 on 06/25/10. He is on Pravastatin. He did not notice side effects, such as muscle pain. His AST and ALT was normal on 06/11/10. Will check his Lipid panel today.  # COPD: Stable. No cough or SOB. Will continue current regimen.  # Erectile dysfunction: this problem improved after discontinued atenolol. He is happy about. Will continue current regimen and follow up.  # Tobacco abuse: he is on Chantix and trying to quit smoking. But he is still smoking 0.3 PAD. Would like to cut down further

## 2011-11-01 ENCOUNTER — Ambulatory Visit: Payer: Self-pay | Admitting: Critical Care Medicine

## 2011-11-15 ENCOUNTER — Ambulatory Visit (INDEPENDENT_AMBULATORY_CARE_PROVIDER_SITE_OTHER): Payer: Self-pay | Admitting: Critical Care Medicine

## 2011-11-15 ENCOUNTER — Encounter: Payer: Self-pay | Admitting: Critical Care Medicine

## 2011-11-15 VITALS — BP 130/72 | HR 84 | Temp 98.4°F | Ht 63.0 in | Wt 141.4 lb

## 2011-11-15 DIAGNOSIS — J449 Chronic obstructive pulmonary disease, unspecified: Secondary | ICD-10-CM

## 2011-11-15 MED ORDER — PREDNISONE 10 MG PO TABS: ORAL_TABLET | ORAL | Status: DC

## 2011-11-15 MED ORDER — AZITHROMYCIN 250 MG PO TABS: 250.0000 mg | ORAL_TABLET | Freq: Every day | ORAL | Status: AC

## 2011-11-15 MED ORDER — VARENICLINE TARTRATE 1 MG PO TABS: 1.0000 mg | ORAL_TABLET | Freq: Two times a day (BID) | ORAL | Status: DC

## 2011-11-15 NOTE — Patient Instructions (Signed)
Stay on inhalers Focus on smoking cessation Resume Chantix one twice daily Azithromycin 250mg  Take two once then one daily until gone Prednisone 10mg  Take 4 for two days three for two days two for two days one for two days Return 4 months

## 2011-11-15 NOTE — Assessment & Plan Note (Addendum)
COPD with asthmatic bronchitis smoking induced.  Golds stage 0.  FeV1 90% but FEF 25/75 59%  Now with recurrent bronchitis with active ongoing smoking  Plan Stay on inhalers Focus on smoking cessation Resume Chantix one twice daily Azithromycin 250mg  Take two once then one daily until gone Prednisone 10mg  Take 4 for two days three for two days two for two days one for two days Return 4 months

## 2011-11-15 NOTE — Progress Notes (Signed)
Subjective:    Patient ID: Samuel Chen, male    DOB: 05/08/1956, 56 y.o.   MRN: [id number] 56 y.o.WM Smoker referred for cough and dyspnea, Hx as below: HPI  6/12 Cough and dyspnea is better. Notes burning on the tongue when eats with Advair.   Notes sl amount of mucus.   Notes no real wheeze.  No real chest pain.   No pndrip.  11/30 Pt still smoking .  On chantix.  Notes cough for one week and more dyspnea. No chest pain,  Notes some wheeze at night  11/15/2011 Ran out of chantix and back to smoking again. Now coughing up brown mucus for one week and more dyspneic  Past Medical History  Diagnosis Date  . GERD (gastroesophageal reflux disease)   . Hypertension   . Hyperlipidemia   . COPD (chronic obstructive pulmonary disease)     Questionable diagnosis.  . 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 Myalgia 11/2007    Likely 2/2 Pravachol  . Dyshidrotic eczema   . Tobacco abuse   . Allergic rhinitis      Family History  Problem Relation Age of Onset  . Heart disease Sister   . Heart attack Sister     34-35yo  . Heart disease Father      History   Social History  . Marital Status: Single    Spouse Name: N/A    Number of Children: N/A  . Years of Education: N/A   Occupational History  . Not on file.   Social History Main Topics  . Smoking status: Current Everyday Smoker -- 0.3 packs/day for 40 years    Types: Cigarettes  . Smokeless tobacco: Never Used   Comment: chantix still being taken  . Alcohol Use: Yes     6 to 7 beers daily x 25 years at least   . Drug Use: No  . Sexually Active: Yes   Other Topics Concern  . Not on file   Social History Narrative   Financial assistance approved for 100% discount at [facility] and has [facility] card per Rudell Cobb October 13,2011 4:21PM     Allergies  Allergen Reactions  . Acetaminophen     REACTION: (causes liver problems)  . Aspirin     REACTION: bleeding/nose bleeds     Outpatient Prescriptions  Prior to Visit  Medication Sig Dispense Refill  . albuterol (PROVENTIL,VENTOLIN) 90 MCG/ACT inhaler Inhale 2 puffs into the lungs every 4 (four) hours as needed for wheezing. Inhale 1 -2 puffs every 4 hours as needed for SOB  17 g  11  . clobetasol ointment (TEMOVATE) 0.05 % Apply topically 2 (two) times daily as needed. Apply to the itchy areas on legs twice a day for seven days  60 g  3  . esomeprazole (NEXIUM) 40 MG capsule Take 1 capsule (40 mg total) by mouth daily.  90 capsule  3  . Fluticasone-Salmeterol (ADVAIR) 250-50 MCG/DOSE AEPB Inhale 1 puff into the lungs 2 (two) times daily.  1 each  11  . lisinopril-hydrochlorothiazide (PRINZIDE,ZESTORETIC) 20-25 MG per tablet Take 1 tablet by mouth daily.  30 tablet  3  . meloxicam (MOBIC) 15 MG tablet Take 1 tablet (15 mg total) by mouth daily.  30 tablet  0  . mometasone (NASONEX) 50 MCG/ACT nasal spray Place 2 sprays into the nose daily. Each nostril  17 g  3  . sildenafil (VIAGRA) 100 MG tablet Take 1 tablet (100  mg total) by mouth as needed for erectile dysfunction.  30 tablet  0  . predniSONE (DELTASONE) 10 MG tablet Take 4 for three days 3 for three days 2 for three days 1 for three days and stop  30 tablet  0  . varenicline (CHANTIX) 1 MG tablet Take 1 mg by mouth 2 (two) times daily.            Review of Systems  Constitutional: Negative for unexpected weight change.  HENT: Negative for nosebleeds, congestion, sneezing, trouble swallowing, dental problem and sinus pressure.   Eyes: Negative for redness and itching.  Respiratory: Negative for chest tightness.   Cardiovascular: Negative for palpitations.  Gastrointestinal: Negative for nausea.  Genitourinary: Negative for dysuria.  Musculoskeletal: Negative for joint swelling.  Hematological: Does not bruise/bleed easily.  Psychiatric/Behavioral: Negative for dysphoric mood. The patient is not nervous/anxious.        Objective:   Physical Exam  Filed Vitals:   11/15/11 1057    BP: 130/72  Pulse: 84  Temp: 98.4 F (36.9 C)  TempSrc: Oral  Height: 5\' 3"  (1.6 m)  Weight: 141 lb 6.4 oz (64.139 kg)  SpO2: 99%    Gen: Pleasant, well-nourished, in no distress,  normal affect  ENT: No lesions,  mouth clear,  oropharynx clear, no postnasal drip  Neck: No JVD, no TMG, no carotid bruits  Lungs: No use of accessory muscles, no dullness to percussion, exp wheezes,  Poor airflow   Cardiovascular: RRR, heart sounds normal, no murmur or gallops, no peripheral edema  Abdomen: soft and NT, no HSM,  BS normal  Musculoskeletal: No deformities, no cyanosis or clubbing  Neuro: alert, non focal  Skin: Warm, no lesions or rashes     01/09/11 CXR: Findings: Heart and mediastinal contours are within normal limits. No focal opacities or effusions. No acute bony abnormality.  IMPRESSION: No acute findings   Assessment & Plan:   COPD (chronic obstructive pulmonary disease) COPD with asthmatic bronchitis smoking induced.  Golds stage 0.  FeV1 90% but FEF 25/75 59%  Now with recurrent bronchitis with active ongoing smoking  Plan Stay on inhalers Focus on smoking cessation Resume Chantix one twice daily Azithromycin 250mg  Take two once then one daily until gone Prednisone 10mg  Take 4 for two days three for two days two for two days one for two days Return 4 months      Updated Medication List Outpatient Encounter Prescriptions as of 11/15/2011  Medication Sig Dispense Refill  . albuterol (PROVENTIL,VENTOLIN) 90 MCG/ACT inhaler Inhale 2 puffs into the lungs every 4 (four) hours as needed for wheezing. Inhale 1 -2 puffs every 4 hours as needed for SOB  17 g  11  . clobetasol ointment (TEMOVATE) 0.05 % Apply topically 2 (two) times daily as needed. Apply to the itchy areas on legs twice a day for seven days  60 g  3  . esomeprazole (NEXIUM) 40 MG capsule Take 1 capsule (40 mg total) by mouth daily.  90 capsule  3  . Fluticasone-Salmeterol (ADVAIR) 250-50 MCG/DOSE  AEPB Inhale 1 puff into the lungs 2 (two) times daily.  1 each  11  . lisinopril-hydrochlorothiazide (PRINZIDE,ZESTORETIC) 20-25 MG per tablet Take 1 tablet by mouth daily.  30 tablet  3  . meloxicam (MOBIC) 15 MG tablet Take 1 tablet (15 mg total) by mouth daily.  30 tablet  0  . mometasone (NASONEX) 50 MCG/ACT nasal spray Place 2 sprays into the nose daily. Each nostril  17 g  3  . sildenafil (VIAGRA) 100 MG tablet Take 1 tablet (100 mg total) by mouth as needed for erectile dysfunction.  30 tablet  0  . varenicline (CHANTIX) 1 MG tablet Take 1 tablet (1 mg total) by mouth 2 (two) times daily.  60 tablet  4  . DISCONTD: predniSONE (DELTASONE) 10 MG tablet Take 4 for three days 3 for three days 2 for three days 1 for three days and stop  30 tablet  0  . DISCONTD: varenicline (CHANTIX) 1 MG tablet Take 1 mg by mouth 2 (two) times daily.        Marland Kitchen azithromycin (ZITHROMAX) 250 MG tablet Take 1 tablet (250 mg total) by mouth daily. Take two once then one daily until gone  6 each  0  . predniSONE (DELTASONE) 10 MG tablet Take 4 for two days three for two days two for two days one for two days  20 tablet  0

## 2011-11-27 ENCOUNTER — Telehealth: Payer: Self-pay | Admitting: Critical Care Medicine

## 2011-11-27 DIAGNOSIS — R05 Cough: Secondary | ICD-10-CM

## 2011-11-27 DIAGNOSIS — R059 Cough, unspecified: Secondary | ICD-10-CM

## 2011-11-27 NOTE — Telephone Encounter (Signed)
Per 3.15.13 ov note:  Patient Instructions     Stay on inhalers  Focus on smoking cessation  Resume Chantix one twice daily  Azithromycin 250mg  Take two once then one daily until gone  Prednisone 10mg  Take 4 for two days three for two days two for two days one for two days  Return 4 months    Called spoke with patient who reported that he finished his abx and prednisone x3-4 days ago.  Still having prod cough with brown mucus, shortness of breath, wheezing and chills that pt reports have not improved with the medication.  Requesting additional recs.  [facility].  Allergies  Allergen Reactions  . Acetaminophen     REACTION: (causes liver problems)  . Aspirin     REACTION: bleeding/nose bleeds    As PEW is not in the office or listed on the schedule, will forward to doc of the day.  Dr Shelle Iron please advise, thanks.

## 2011-11-27 NOTE — Telephone Encounter (Signed)
Called and spoke with pt and he is aware per [facility] that he will need an appt for eval for these symptoms.  Ok per JJ to add pt on to TP schedule since pt did not want to go to the [facility] office to see PW.  cxr has been placed in the computer prior to ov.

## 2011-11-27 NOTE — Telephone Encounter (Signed)
Pt needs ov with persistent symptoms despite good treatment Needs cxr before visit.

## 2011-11-28 ENCOUNTER — Ambulatory Visit (INDEPENDENT_AMBULATORY_CARE_PROVIDER_SITE_OTHER)
Admission: RE | Admit: 2011-11-28 | Discharge: 2011-11-28 | Disposition: A | Discharge status: Home / Self Care | Payer: Self-pay | Source: Ambulatory Visit | Attending: Adult Health | Admitting: Adult Health

## 2011-11-28 ENCOUNTER — Encounter: Payer: Self-pay | Admitting: Adult Health

## 2011-11-28 ENCOUNTER — Ambulatory Visit (INDEPENDENT_AMBULATORY_CARE_PROVIDER_SITE_OTHER): Payer: Self-pay | Admitting: Adult Health

## 2011-11-28 VITALS — BP 104/66 | HR 80 | Temp 97.2°F | Ht 63.0 in | Wt 141.2 lb

## 2011-11-28 DIAGNOSIS — R05 Cough: Secondary | ICD-10-CM

## 2011-11-28 DIAGNOSIS — R059 Cough, unspecified: Secondary | ICD-10-CM

## 2011-11-28 DIAGNOSIS — J449 Chronic obstructive pulmonary disease, unspecified: Secondary | ICD-10-CM

## 2011-11-28 DIAGNOSIS — B370 Candidal stomatitis: Secondary | ICD-10-CM

## 2011-11-28 MED ORDER — CLOTRIMAZOLE 10 MG MT TROC: 10.0000 mg | Freq: Every day | OROMUCOSAL | Status: AC

## 2011-11-28 MED ORDER — AMOXICILLIN-POT CLAVULANATE 875-125 MG PO TABS: 1.0000 | ORAL_TABLET | Freq: Two times a day (BID) | ORAL | Status: AC

## 2011-11-28 MED ORDER — HYDROCODONE-HOMATROPINE 5-1.5 MG/5ML PO SYRP: 5.0000 mL | ORAL_SOLUTION | Freq: Four times a day (QID) | ORAL | Status: AC | PRN

## 2011-11-28 MED ORDER — PREDNISONE 10 MG PO TABS: ORAL_TABLET | ORAL | Status: DC

## 2011-11-28 NOTE — Progress Notes (Deleted)
  Subjective:    Patient ID: Samuel Chen, male    DOB: 1955/11/11, 56 y.o.   MRN: [id number]  HPI    Review of Systems     Objective:   Physical Exam        Assessment & Plan:

## 2011-11-28 NOTE — Assessment & Plan Note (Signed)
Mycelex troches shows 5 times daily x1 week.   Brush /rinse and gargle after inhaler use.

## 2011-11-28 NOTE — Progress Notes (Signed)
Subjective:    Patient ID: Samuel Chen, male    DOB: 10/23/1955, 56 y.o.   MRN: [id number] 56 y.o.WM Smoker referred for cough and dyspnea, Hx as below: HPI  6/12 Cough and dyspnea is better. Notes burning on the tongue when eats with Advair.   Notes sl amount of mucus.   Notes no real wheeze.  No real chest pain.   No pndrip.  11/30 Pt still smoking .  On chantix.  Notes cough for one week and more dyspnea. No chest pain,  Notes some wheeze at night  11/15/2011 Ran out of chantix and back to smoking again. Now coughing up brown mucus for one week and more dyspneic >>rx zpack and steroid taper   11/28/2011 Acute OV  Pt returns for persistent cough and congestion . Seen 2 weeks with COPD flare, tx w/ zpack and steroid taper. Does not fill better with prod cough with brown/green mucus, wheezing, SOB, chills, sinus congestion with same color drainage. Still smoking .  CXR today with no acute process  No hemoptysis , or fever or edema.   Past Medical History  Diagnosis Date  . GERD (gastroesophageal reflux disease)   . Hypertension   . Hyperlipidemia   . COPD (chronic obstructive pulmonary disease)     Questionable diagnosis.  . 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 Myalgia 11/2007    Likely 2/2 Pravachol  . Dyshidrotic eczema   . Tobacco abuse   . Allergic rhinitis      Family History  Problem Relation Age of Onset  . Heart disease Sister   . Heart attack Sister     34-35yo  . Heart disease Father      History   Social History  . Marital Status: Single    Spouse Name: N/A    Number of Children: N/A  . Years of Education: N/A   Occupational History  . Not on file.   Social History Main Topics  . Smoking status: Current Everyday Smoker -- 0.3 packs/day for 40 years    Types: Cigarettes  . Smokeless tobacco: Never Used   Comment: chantix still being taken  . Alcohol Use: Yes     6 to 7 beers daily x 25 years at least   . Drug Use: No  .  Sexually Active: Yes   Other Topics Concern  . Not on file   Social History Narrative   Financial assistance approved for 100% discount at [facility] and has [facility] card per Rudell Cobb October 13,2011 4:21PM     Allergies  Allergen Reactions  . Acetaminophen     REACTION: (causes liver problems)  . Aspirin     REACTION: bleeding/nose bleeds     Outpatient Prescriptions Prior to Visit  Medication Sig Dispense Refill  . albuterol (PROVENTIL,VENTOLIN) 90 MCG/ACT inhaler Inhale 2 puffs into the lungs every 4 (four) hours as needed for wheezing. Inhale 1 -2 puffs every 4 hours as needed for SOB  17 g  11  . clobetasol ointment (TEMOVATE) 0.05 % Apply topically 2 (two) times daily as needed. Apply to the itchy areas on legs twice a day for seven days  60 g  3  . esomeprazole (NEXIUM) 40 MG capsule Take 1 capsule (40 mg total) by mouth daily.  90 capsule  3  . Fluticasone-Salmeterol (ADVAIR) 250-50 MCG/DOSE AEPB Inhale 1 puff into the lungs 2 (two) times daily.  1 each  11  . lisinopril-hydrochlorothiazide (PRINZIDE,ZESTORETIC)  20-25 MG per tablet Take 1 tablet by mouth daily.  30 tablet  3  . meloxicam (MOBIC) 15 MG tablet Take 1 tablet (15 mg total) by mouth daily.  30 tablet  0  . mometasone (NASONEX) 50 MCG/ACT nasal spray Place 2 sprays into the nose daily. Each nostril  17 g  3  . sildenafil (VIAGRA) 100 MG tablet Take 1 tablet (100 mg total) by mouth as needed for erectile dysfunction.  30 tablet  0  . predniSONE (DELTASONE) 10 MG tablet Take 4 for three days 3 for three days 2 for three days 1 for three days and stop  30 tablet  0  . varenicline (CHANTIX) 1 MG tablet Take 1 mg by mouth 2 (two) times daily.            Review of Systems  Constitutional:   No  weight loss, night sweats,  Fevers, chills,  +fatigue, or  lassitude.  HEENT:   No headaches,  Difficulty swallowing,  Tooth/dental problems, or  Sore throat,                No sneezing, itching, ear ache,  +nasal congestion,  post nasal drip,   CV:  No chest pain,  Orthopnea, PND, swelling in lower extremities, anasarca, dizziness, palpitations, syncope.   GI  No heartburn, indigestion, abdominal pain, nausea, vomiting, diarrhea, change in bowel habits, loss of appetite, bloody stools.   Resp:  ,  No coughing up of blood.     No chest wall deformity  Skin: no rash or lesions.  GU: no dysuria, change in color of urine, no urgency or frequency.  No flank pain, no hematuria   MS:  No joint pain or swelling.  No decreased range of motion.  No back pain.  Psych:  No change in mood or affect. No depression or anxiety.  No memory loss.         Objective:   Physical Exam   Gen: Pleasant, well-nourished, in no distress,  normal affect  WJX:BJYN pharynx with scattered white patches   Neck: No JVD, no TMG, no carotid bruits  Lungs: No use of accessory muscles, no dullness to percussion,coarse with exp wheezing   Cardiovascular: RRR, heart sounds normal, no murmur or gallops, no peripheral edema  Abdomen: soft and NT, no HSM,  BS normal  Musculoskeletal: No deformities, no cyanosis or clubbing  Neuro: alert, non focal  Skin: Warm, no lesions or rashes    CXR :  No acute infiltrate or pulmonary edema. Mild perihilar  peribronchial thickening suspicious for mild bronchitic changes   Assessment & Plan:

## 2011-11-28 NOTE — Assessment & Plan Note (Signed)
Flare   Plan:  ugmentin 875mg  Twice daily  For 7 days -take with food.  Mucinex DM twice daily as needed for cough and congestion. Prednisone taper. Over the next week. Most important goal is to quit smoking. Mycelex troches shows 5 times daily x1 week. If your cough, continues, you will need to see her family doctor to consider changing your blood pressure by mouth-lisinopril as this may cause a persistent cough. Hydromet 1-2 tsp every 4-6 hr As needed  Cough  Please contact office for sooner follow up if symptoms do not improve or worsen or seek emergency care  follow up Dr. Delford Field  As planned and As needed   Brush /rinse and gargle after inhaler use.

## 2011-11-28 NOTE — Patient Instructions (Signed)
Augmentin 875mg  Twice daily  For 7 days -take with food.  Mucinex DM twice daily as needed for cough and congestion. Prednisone taper. Over the next week. Most important goal is to quit smoking. Mycelex troches shows 5 times daily x1 week. If your cough, continues, you will need to see her family doctor to consider changing your blood pressure by mouth-lisinopril as this may cause a persistent cough. Hydromet 1-2 tsp every 4-6 hr As needed  Cough  Please contact office for sooner follow up if symptoms do not improve or worsen or seek emergency care  follow up Dr. Delford Field  As planned and As needed   Brush /rinse and gargle after inhaler use.

## 2012-01-06 ENCOUNTER — Encounter: Payer: Self-pay | Admitting: Internal Medicine

## 2012-01-07 ENCOUNTER — Other Ambulatory Visit: Payer: Self-pay | Admitting: *Deleted

## 2012-01-07 DIAGNOSIS — I10 Essential (primary) hypertension: Secondary | ICD-10-CM

## 2012-01-07 DIAGNOSIS — F528 Other sexual dysfunction not due to a substance or known physiological condition: Secondary | ICD-10-CM

## 2012-01-07 DIAGNOSIS — M25519 Pain in unspecified shoulder: Secondary | ICD-10-CM

## 2012-01-07 NOTE — Telephone Encounter (Signed)
Viagra rx needs to be called to [facility] pharmacy.

## 2012-01-10 ENCOUNTER — Telehealth: Payer: Self-pay | Admitting: *Deleted

## 2012-01-10 MED ORDER — MELOXICAM 15 MG PO TABS: 15.0000 mg | ORAL_TABLET | Freq: Every day | ORAL | Status: DC

## 2012-01-10 MED ORDER — LISINOPRIL-HYDROCHLOROTHIAZIDE 20-25 MG PO TABS: 1.0000 | ORAL_TABLET | Freq: Every day | ORAL | Status: DC

## 2012-01-10 MED ORDER — SILDENAFIL CITRATE 100 MG PO TABS: 100.0000 mg | ORAL_TABLET | ORAL | Status: DC | PRN

## 2012-01-10 NOTE — Telephone Encounter (Signed)
Mobic and Meloxicam rxs called to [facility] pharmacy on [address].

## 2012-01-10 NOTE — Telephone Encounter (Signed)
Clarification of last entry:  Mobic and Lisinopril/HCTZ called to [facility] pharmacy.

## 2012-01-10 NOTE — Telephone Encounter (Signed)
[facility] pharmacy does not have Viagra; pt awared.

## 2012-02-03 ENCOUNTER — Encounter: Payer: Self-pay | Admitting: Internal Medicine

## 2012-02-04 ENCOUNTER — Ambulatory Visit (INDEPENDENT_AMBULATORY_CARE_PROVIDER_SITE_OTHER): Payer: Self-pay | Admitting: Internal Medicine

## 2012-02-04 ENCOUNTER — Encounter: Payer: Self-pay | Admitting: Internal Medicine

## 2012-02-04 VITALS — BP 131/77 | HR 82 | Temp 97.2°F | Ht 63.0 in | Wt 138.7 lb

## 2012-02-04 DIAGNOSIS — F1010 Alcohol abuse, uncomplicated: Secondary | ICD-10-CM

## 2012-02-04 DIAGNOSIS — E785 Hyperlipidemia, unspecified: Secondary | ICD-10-CM

## 2012-02-04 DIAGNOSIS — F528 Other sexual dysfunction not due to a substance or known physiological condition: Secondary | ICD-10-CM

## 2012-02-04 DIAGNOSIS — J019 Acute sinusitis, unspecified: Secondary | ICD-10-CM

## 2012-02-04 DIAGNOSIS — F17200 Nicotine dependence, unspecified, uncomplicated: Secondary | ICD-10-CM

## 2012-02-04 DIAGNOSIS — N529 Male erectile dysfunction, unspecified: Secondary | ICD-10-CM

## 2012-02-04 DIAGNOSIS — M25519 Pain in unspecified shoulder: Secondary | ICD-10-CM

## 2012-02-04 DIAGNOSIS — I10 Essential (primary) hypertension: Secondary | ICD-10-CM

## 2012-02-04 MED ORDER — MOMETASONE FUROATE 50 MCG/ACT NA SUSP: 2.0000 | Freq: Every day | NASAL | Status: DC

## 2012-02-04 MED ORDER — SILDENAFIL CITRATE 100 MG PO TABS: 100.0000 mg | ORAL_TABLET | ORAL | Status: DC | PRN

## 2012-02-04 MED ORDER — PRAVASTATIN SODIUM 40 MG PO TABS: 40.0000 mg | ORAL_TABLET | Freq: Every day | ORAL | Status: DC

## 2012-02-04 NOTE — Assessment & Plan Note (Signed)
Not ready to commit to quitting despite wanting to be a non-smoker.  I had discontinued Chantix because he is not ready to stop smoking tomorrow, but pulmonology restarted it so this is being managed by them.  Stopping smoking would help his COPD as well as his ED.

## 2012-02-04 NOTE — Progress Notes (Signed)
Subjective:   Patient ID: Samuel Chen male   DOB: 1956-02-18 56 y.o.   MRN: [id number]  HPI: [name] is a 57 y.o. with HTN, heavy alcohol use, HLD, COPD, L rotator cuff tendonitis here for follow-up.  No new complaints today.  L shoulder pain continues to be a problem.    Past Medical History  Diagnosis Date  . GERD (gastroesophageal reflux disease)   . Hypertension   . Hyperlipidemia   . COPD (chronic obstructive pulmonary disease)     Questionable diagnosis.  . 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 Myalgia 11/2007    Likely 2/2 Pravachol  . Dyshidrotic eczema   . Tobacco abuse   . Allergic rhinitis    Current Outpatient Prescriptions  Medication Sig Dispense Refill  . albuterol (PROVENTIL,VENTOLIN) 90 MCG/ACT inhaler Inhale 2 puffs into the lungs every 4 (four) hours as needed for wheezing. Inhale 1 -2 puffs every 4 hours as needed for SOB  17 g  11  . clobetasol ointment (TEMOVATE) 0.05 % Apply topically 2 (two) times daily as needed. Apply to the itchy areas on legs twice a day for seven days  60 g  3  . esomeprazole (NEXIUM) 40 MG capsule Take 1 capsule (40 mg total) by mouth daily.  90 capsule  3  . Fluticasone-Salmeterol (ADVAIR) 250-50 MCG/DOSE AEPB Inhale 1 puff into the lungs 2 (two) times daily.  1 each  11  . lisinopril-hydrochlorothiazide (PRINZIDE,ZESTORETIC) 20-25 MG per tablet Take 1 tablet by mouth daily.  30 tablet  4  . meloxicam (MOBIC) 15 MG tablet Take 1 tablet (15 mg total) by mouth daily.  30 tablet  4  . mometasone (NASONEX) 50 MCG/ACT nasal spray Place 2 sprays into the nose daily. One in each nostril  17 g  3  . pravastatin (PRAVACHOL) 40 MG tablet Take 1 tablet (40 mg total) by mouth daily.  30 tablet  6  . predniSONE (DELTASONE) 10 MG tablet 4 tabs for 2 days, then 3 tabs for 2 days, 2 tabs for 2 days, then 1 tab for 2 days, then stop  20 tablet  0  . sildenafil (VIAGRA) 100 MG tablet Take 1 tablet (100 mg total) by mouth  as needed for erectile dysfunction.  30 tablet  5  . varenicline (CHANTIX) 1 MG tablet Take 1 tablet (1 mg total) by mouth 2 (two) times daily.  60 tablet  4  . DISCONTD: mometasone (NASONEX) 50 MCG/ACT nasal spray Place 2 sprays into the nose daily. Each nostril  17 g  3  . DISCONTD: sildenafil (VIAGRA) 100 MG tablet Take 1 tablet (100 mg total) by mouth as needed for erectile dysfunction.  30 tablet  0   Family History  Problem Relation Age of Onset  . Heart disease Sister   . Heart attack Sister     34-35yo  . Heart disease Father    History   Social History  . Marital Status: Single    Spouse Name: N/A    Number of Children: N/A  . Years of Education: N/A   Social History Main Topics  . Smoking status: Current Everyday Smoker -- 0.3 packs/day for 40 years    Types: Cigarettes  . Smokeless tobacco: Never Used   Comment: chantix still being taken  . Alcohol Use: Yes     6 to 7 beers daily x 25 years at least   . Drug Use: No  .  Sexually Active: Yes   Other Topics Concern  . None   Social History Narrative   Financial assistance approved for 100% discount at [facility] and has [facility] card per [employer] October 13,2011 4:21PM   Review of Systems: Constitutional: Denies fever, chills, diaphoresis, appetite change and fatigue.  Respiratory: Denies worsened SOB, DOE, cough, chest tightness,  and wheezing.   Cardiovascular: Denies chest pain, palpitations and leg swelling.  Gastrointestinal: Denies nausea, vomiting, abdominal pain, diarrhea, constipation, blood in stool and abdominal distention.  Genitourinary: Denies dysuria, urgency, frequency, hematuria, flank pain and difficulty urinating.  Neurological: Denies dizziness, seizures, syncope, weakness, light-headedness, numbness and headaches.     Objective:  Physical Exam: Filed Vitals:   02/04/12 1559  BP: 131/77  Pulse: 82  Temp: 97.2 F (36.2 C)  TempSrc: Oral  Height: 5\' 3"  (1.6 m)  Weight: 138 lb 11.2 oz (62.914  kg)   Constitutional: Vital signs reviewed.  Patient is a well-developed and well-nourished man in no acute distress and cooperative with exam. Alert and oriented x3.  Head: Normocephalic and atraumatic Mouth: no erythema or exudates, MMM Eyes: PERRL, EOMI, conjunctivae normal, No scleral icterus.  Neck: No JVD  Cardiovascular: RRR, S1 normal, S2 normal, no MRG, pulses symmetric and intact bilaterally Pulmonary/Chest: CTAB, no wheezes, rales, or rhonchi Musculoskeletal: No joint deformities, erythema, or stiffness, ROM full and no nontender Hematology: no cervical, inginal, or axillary adenopathy.  L shoulder pain with abduction and flexion but ROM intact.  No severe tenderness. Neurological: A&O x3, Strength is normal and symmetric bilaterally, cranial nerve II-XII are grossly intact, no focal motor deficit, sensory intact to light touch bilaterally.  Skin: Warm, dry and intact. No rash, cyanosis, or clubbing.    Assessment & Plan:

## 2012-02-04 NOTE — Assessment & Plan Note (Signed)
Will refill Viagra.  Still needs this despite improvement since stopping atenolol

## 2012-02-04 NOTE — Patient Instructions (Signed)
Please return to clinic in 6 months.

## 2012-02-04 NOTE — Assessment & Plan Note (Signed)
LDL 104 good in February.  Will refill Pravastatin, continue this medicine.

## 2012-02-04 NOTE — Assessment & Plan Note (Signed)
Still drinking 4-5 beers per day.  I stressed importance of cessation.  Stopping would likely help his ED.

## 2012-02-04 NOTE — Assessment & Plan Note (Signed)
Good control.  Continue Lisinopril-HCTZ

## 2012-02-04 NOTE — Assessment & Plan Note (Signed)
Continues to have pain.  On exam seems to be rotator cuff tendonitis.  Injection by sports medicine helped in past (in October).  Put in referral for patient to go back to them for possible repeat injection.  Continue Mobic

## 2012-02-05 NOTE — Progress Notes (Signed)
Addended by: Emeline General on: 02/05/2012 01:30 PM   Modules accepted: Orders

## 2012-02-07 ENCOUNTER — Telehealth: Payer: Self-pay | Admitting: *Deleted

## 2012-02-07 NOTE — Telephone Encounter (Signed)
Have talked with pt about [facility] - [facility] and Viagra. Company was sending med to pt's home address. [facility] states must go to doctors office. Checked with [facility] - will not accept med mailed to [facility]. Talked with [facility] pharmacy - does not work with Viagra.Stanton Kidney Astin Sayre RN 02/07/12 1:30PM

## 2012-02-10 ENCOUNTER — Telehealth: Payer: Self-pay | Admitting: *Deleted

## 2012-02-10 NOTE — Telephone Encounter (Signed)
Received call from [employer].  They wanted to confirm our address in order to ship pt's Viagra. Informed them that our office no longer accepts mail order prescriptions and asked if they could mail rx directly to patient.  The way the program works, they are unable to do this. They will inform patient .Samuel Chen Cassady6/10/201311:55 AM

## 2012-02-21 ENCOUNTER — Telehealth: Payer: Self-pay | Admitting: Critical Care Medicine

## 2012-02-21 ENCOUNTER — Ambulatory Visit: Payer: Self-pay | Admitting: Family Medicine

## 2012-02-21 NOTE — Telephone Encounter (Signed)
PHARMACY STATES PT HAS BEEN USING CHANTIX FOR APPROX 15 MONTHS AND SHOULD HE CONTINUE USING ? DR Delford Field Please advise  Thank you

## 2012-02-21 NOTE — Addendum Note (Signed)
Addended by: Neomia Dear on: 02/21/2012 04:46 PM   Modules accepted: Orders

## 2012-02-21 NOTE — Telephone Encounter (Signed)
For now , yes Needs OV to discuss, NP ok

## 2012-02-24 NOTE — Telephone Encounter (Signed)
Pt is scheduled to come in 03/02/12 at 10:00.

## 2012-02-24 NOTE — Telephone Encounter (Signed)
I spoke with the pharmacy and advised of PW recs. I [id number] with the pt to set appt.Carron Curie, CMA

## 2012-02-24 NOTE — Telephone Encounter (Signed)
Pt returned our call.  Call him back @ [phone] Leanora Ivanoff

## 2012-02-28 ENCOUNTER — Ambulatory Visit (INDEPENDENT_AMBULATORY_CARE_PROVIDER_SITE_OTHER): Payer: Self-pay | Admitting: Family Medicine

## 2012-02-28 VITALS — BP 120/70

## 2012-02-28 DIAGNOSIS — M25519 Pain in unspecified shoulder: Secondary | ICD-10-CM

## 2012-02-28 MED ORDER — METHYLPREDNISOLONE ACETATE 40 MG/ML IJ SUSP: 40.0000 mg | Freq: Once | INTRAMUSCULAR | Status: DC

## 2012-02-28 NOTE — Patient Instructions (Addendum)
Do easy range of motion exercises  Follow up in one month  Avoid heavy lifting as much as possible over the weekend

## 2012-02-28 NOTE — Assessment & Plan Note (Addendum)
Shoulder injection today for rotator cuff tendonitis.  Don't suspect a tear because he has good strength. Easy ROM Follow up in one month

## 2012-02-28 NOTE — Progress Notes (Signed)
  Subjective:    Patient ID: Samuel Chen, male    DOB: [birth year], 56 y.o.   MRN: [id number]  HPI 56 y/o male is here with a flare of his rotator cuff syndrome.  He responded well to a subacromial injection.  He got relief for about 8 months.  His pain is similar to before.  +night pain.  No new injuries.  He is a Nature conservation officer.   Review of Systems     Objective:   Physical Exam  Shoulder: Palpation is normal with no tenderness over AC joint or bicipital groove. ROM is full but he has a painful arc. Rotator cuff strength normal throughout. Positive empty can and hawkins Speeds and Yergason's tests normal. negative clunk and good stability. no drop arm sign.  Consent obtained and verified. cleansed with alcohol. Topical analgesic spray: Ethyl chloride. Joint: subacromial Approached in typical fashion from the posterior aspect Completed without difficulty Meds: 40mg  depo medrol; 3 ml of lidocaine without epinephrine Needle:25 g Aftercare instructions and Red flags advised.       Assessment & Plan:

## 2012-03-02 ENCOUNTER — Encounter: Payer: Self-pay | Admitting: Adult Health

## 2012-03-02 ENCOUNTER — Ambulatory Visit (INDEPENDENT_AMBULATORY_CARE_PROVIDER_SITE_OTHER): Payer: Self-pay | Admitting: Adult Health

## 2012-03-02 VITALS — BP 120/88 | HR 80 | Temp 97.2°F | Ht 63.0 in | Wt 142.2 lb

## 2012-03-02 DIAGNOSIS — J449 Chronic obstructive pulmonary disease, unspecified: Secondary | ICD-10-CM

## 2012-03-02 NOTE — Patient Instructions (Addendum)
May use nicotine patches .  Work on stopping smoking  Consider the smoking cessation classes -brochure given .  follow up Dr. Delford Field  This month as planned

## 2012-03-02 NOTE — Progress Notes (Signed)
Subjective:    Patient ID: Samuel Chen, male    DOB: 1955-11-24, 56 y.o.   MRN: [id number] 56 y.o.WM Smoker referred for cough and dyspnea, Hx as below:   6/12 Cough and dyspnea is better. Notes burning on the tongue when eats with Advair.   Notes sl amount of mucus.   Notes no real wheeze.  No real chest pain.   No pndrip.  11/30 Pt still smoking .  On chantix.  Notes cough for one week and more dyspnea. No chest pain,  Notes some wheeze at night  11/15/2011 Ran out of chantix and back to smoking again. Now coughing up brown mucus for one week and more dyspneic >>rx zpack and steroid taper   11/28/2011 Acute OV  Pt returns for persistent cough and congestion . Seen 2 weeks with COPD flare, tx w/ zpack and steroid taper. Does not fill better with prod cough with brown/green mucus, wheezing, SOB, chills, sinus congestion with same color drainage. Still smoking .  CXR today with no acute process  No hemoptysis , or fever or edema.  >>Augmentin    03/02/2012 Follow up  Pt returns to discuss Chantix rx. He has been taking on/off for last 1 year.  Pharmacy sent message that pt had been on rx for prolonged time.  He is receiving this rx thru prescription assistance program.  He has not been taking Chantix consistently. Takes for a while then  Stops and restarts over last year.  Has not been effective in smoking cessation .  He has cut back to 1/2 PPD but never has quit.  We discussed stopping chantix ( as he is not taking currently)  Discussed nicotine patches and a smoking cessation class.  Says his breathing is at baseline. No flare in cough or dyspnea.  Of note recently started on ACE Inhibitor by PCP .  Denies cough.        Review of Systems  Constitutional:   No  weight loss, night sweats,  Fevers, chills,  +fatigue, or  lassitude.  HEENT:   No headaches,  Difficulty swallowing,  Tooth/dental problems, or  Sore throat,                No sneezing, itching, ear ache,  nasal congestion, post nasal drip,   CV:  No chest pain,  Orthopnea, PND, swelling in lower extremities, anasarca, dizziness, palpitations, syncope.   GI  No heartburn, indigestion, abdominal pain, nausea, vomiting, diarrhea, change in bowel habits, loss of appetite, bloody stools.   Resp:  ,  No coughing up of blood.     No chest wall deformity  Skin: no rash or lesions.  GU: no dysuria, change in color of urine, no urgency or frequency.  No flank pain, no hematuria   MS:  No joint pain or swelling.  No decreased range of motion.  No back pain.  Psych:  No change in mood or affect. No depression or anxiety.  No memory loss.         Objective:   Physical Exam   Gen: Pleasant, well-nourished, in no distress,  normal affect  WUJ:WJXB pharynx with scattered white patches   Neck: No JVD, no TMG, no carotid bruits  Lungs: No use of accessory muscles, no dullness to percussion CTA , diminished bs in bases   Cardiovascular: RRR, heart sounds normal, no murmur or gallops, no peripheral edema  Abdomen: soft and NT, no HSM,  BS normal  Musculoskeletal: No deformities, no  cyanosis or clubbing  Neuro: alert, non focal  Skin: Warm, no lesions or rashes     Assessment & Plan:

## 2012-03-02 NOTE — Assessment & Plan Note (Signed)
Compensated on present regimen.  Watch carefully as he is now on ACE Inhibitor (prone to AECOPD)  Encouraged on smoking cessation - unresponsive to chantix.  Advised on to continue on current regimen.  May use nicotine patches , quit smoking brochure/classes given.

## 2012-03-25 ENCOUNTER — Encounter: Payer: Self-pay | Admitting: Critical Care Medicine

## 2012-03-25 ENCOUNTER — Ambulatory Visit (INDEPENDENT_AMBULATORY_CARE_PROVIDER_SITE_OTHER): Payer: Self-pay | Admitting: Critical Care Medicine

## 2012-03-25 VITALS — BP 118/74 | HR 65 | Temp 97.5°F | Ht 63.0 in | Wt 142.4 lb

## 2012-03-25 DIAGNOSIS — J449 Chronic obstructive pulmonary disease, unspecified: Secondary | ICD-10-CM

## 2012-03-25 DIAGNOSIS — F17200 Nicotine dependence, unspecified, uncomplicated: Secondary | ICD-10-CM

## 2012-03-25 MED ORDER — NICOTINE 10 MG IN INHA: RESPIRATORY_TRACT | Status: DC

## 2012-03-25 MED ORDER — LOSARTAN POTASSIUM-HCTZ 100-25 MG PO TABS: 1.0000 | ORAL_TABLET | Freq: Every day | ORAL | Status: DC

## 2012-03-25 MED ORDER — PREDNISONE 10 MG PO TABS: ORAL_TABLET | ORAL | Status: DC

## 2012-03-25 NOTE — Patient Instructions (Addendum)
Prednisone 10mg   Take 4 tablets daily for 5 days then stop Stop chantix Start Nicotrol inhaler Supplement with Nicorette Minis 4mg  three times daily Stop prinizide Start losartan HCT daily Return 2 months

## 2012-03-25 NOTE — Progress Notes (Signed)
Subjective:    Patient ID: Samuel Chen, male    DOB: [birth year], 56 y.o.   MRN: [id number]  HPI 56 y.o.WM Smoker referred for cough and dyspnea, Hx as below:   6/12 Cough and dyspnea is better. Notes burning on the tongue when eats with Advair.   Notes sl amount of mucus.   Notes no real wheeze.  No real chest pain.   No pndrip.  11/30 Pt still smoking .  On chantix.  Notes cough for one week and more dyspnea. No chest pain,  Notes some wheeze at night  11/15/2011 Ran out of chantix and back to smoking again. Now coughing up brown mucus for one week and more dyspneic >>rx zpack and steroid taper   11/28/2011 Acute OV  Pt returns for persistent cough and congestion . Seen 2 weeks with COPD flare, tx w/ zpack and steroid taper. Does not fill better with prod cough with brown/green mucus, wheezing, SOB, chills, sinus congestion with same color drainage. Still smoking .  CXR today with no acute process  No hemoptysis , or fever or edema.  >>Augmentin    03/02/2012 Follow up  Pt returns to discuss Chantix rx. He has been taking on/off for last 1 year.  Pharmacy sent message that pt had been on rx for prolonged time.  He is receiving this rx thru prescription assistance program.  He has not been taking Chantix consistently. Takes for a while then  Stops and restarts over last year.  Has not been effective in smoking cessation .  He has cut back to 1/2 PPD but never has quit.  We discussed stopping chantix ( as he is not taking currently)  Discussed nicotine patches and a smoking cessation class.  Says his breathing is at baseline. No flare in cough or dyspnea.  Of note recently started on ACE Inhibitor by PCP .  Denies cough.   03/25/2012 Pt notes still coughing.  Pt still notes DOE.  No real chest pain. SMokes 1/2 to 3/4 PPD. Pt denies any significant sore throat, nasal congestion or excess secretions, fever, chills, sweats, unintended weight loss, pleurtic or exertional chest  pain, orthopnea PND, or leg swelling Pt denies any increase in rescue therapy over baseline, denies waking up needing it or having any early am or nocturnal exacerbations of coughing/wheezing/or dyspnea. Pt also denies any obvious fluctuation in symptoms with  weather or environmental change or other alleviating or aggravating factors      Review of Systems 11 point review of system reviewed and is negative except as per history present illness    Objective:   Physical Exam Filed Vitals:   03/25/12 0908  BP: 118/74  Pulse: 65  Temp: 97.5 F (36.4 C)  TempSrc: Oral  Height: 5\' 3"  (1.6 m)  Weight: 142 lb 6.4 oz (64.592 kg)  SpO2: 96%    Gen: Pleasant, well-nourished, in no distress,  normal affect  ENT: No lesions,  mouth clear,  oropharynx clear, no postnasal drip  Neck: No JVD, no TMG, no carotid bruits  Lungs: No use of accessory muscles, no dullness to percussion, expired wheezes and scattered rhonchi  Cardiovascular: RRR, heart sounds normal, no murmur or gallops, no peripheral edema  Abdomen: soft and NT, no HSM,  BS normal  Musculoskeletal: No deformities, no cyanosis or clubbing  Neuro: alert, non focal  Skin: Warm, no lesions or rashes  No results found.        Assessment & Plan:   COPD (  chronic obstructive pulmonary disease) Gold stage A COPD with ongoing smoking use and chronic recurrent bronchitis Failed Chantix With ongoing cough need to remove ACE inhibitor and start ARB  Plan Nicotine replacement therapy with Nicotrol and Nicorette lozenge Maintain inhaled medications as prescribed Continue to pursue smoking cessation Prednisone pulse Discontinue Prinzide and begin Hyzaar 100/25 one daily   Updated Medication List Outpatient Encounter Prescriptions as of 03/25/2012  Medication Sig Dispense Refill  . albuterol (PROVENTIL,VENTOLIN) 90 MCG/ACT inhaler Inhale 2 puffs into the lungs every 4 (four) hours as needed for wheezing. Inhale 1 -2 puffs  every 4 hours as needed for SOB  17 g  11  . clobetasol ointment (TEMOVATE) 0.05 % Apply topically 2 (two) times daily as needed. Apply to the itchy areas on legs twice a day for seven days  60 g  3  . esomeprazole (NEXIUM) 40 MG capsule Take 1 capsule (40 mg total) by mouth daily.  90 capsule  3  . Fluticasone-Salmeterol (ADVAIR) 250-50 MCG/DOSE AEPB Inhale 1 puff into the lungs 2 (two) times daily.  1 each  11  . meloxicam (MOBIC) 15 MG tablet Take 1 tablet (15 mg total) by mouth daily.  30 tablet  4  . mometasone (NASONEX) 50 MCG/ACT nasal spray Place 2 sprays into the nose daily. One in each nostril  17 g  3  . pravastatin (PRAVACHOL) 40 MG tablet Take 1 tablet (40 mg total) by mouth daily.  30 tablet  6  . sildenafil (VIAGRA) 100 MG tablet Take 1 tablet (100 mg total) by mouth as needed for erectile dysfunction.  30 tablet  5  . DISCONTD: lisinopril-hydrochlorothiazide (PRINZIDE,ZESTORETIC) 20-25 MG per tablet Take 1 tablet by mouth daily.  30 tablet  4  . DISCONTD: varenicline (CHANTIX) 1 MG tablet Take 1 tablet (1 mg total) by mouth 2 (two) times daily.  60 tablet  4  . losartan-hydrochlorothiazide (HYZAAR) 100-25 MG per tablet Take 1 tablet by mouth daily.  30 tablet  6  . nicotine (NICOTROL) 10 MG inhaler 80 puff per cartridge , 8 cartridges per day  168 each  3  . predniSONE (DELTASONE) 10 MG tablet Take 4 tablets daily for 5 days then stop  20 tablet  0  . DISCONTD: predniSONE (DELTASONE) 10 MG tablet 4 tabs for 2 days, then 3 tabs for 2 days, 2 tabs for 2 days, then 1 tab for 2 days, then stop  20 tablet  0   Facility-Administered Encounter Medications as of 03/25/2012  Medication Dose Route Frequency Provider Last Rate Last Dose  . methylPREDNISolone acetate (DEPO-MEDROL) injection 40 mg  40 mg Intra-Lesional Once Sherrell Puller, MD

## 2012-03-25 NOTE — Assessment & Plan Note (Addendum)
Gold stage A COPD with ongoing smoking use and chronic recurrent bronchitis Failed Chantix With ongoing cough need to remove ACE inhibitor and start ARB  Plan Nicotine replacement therapy with Nicotrol and Nicorette lozenge Maintain inhaled medications as prescribed Continue to pursue smoking cessation Prednisone pulse Discontinue Prinzide and begin Hyzaar 100/25 one daily

## 2012-03-30 ENCOUNTER — Ambulatory Visit (INDEPENDENT_AMBULATORY_CARE_PROVIDER_SITE_OTHER): Payer: Self-pay | Admitting: Sports Medicine

## 2012-03-30 ENCOUNTER — Encounter: Payer: Self-pay | Admitting: Sports Medicine

## 2012-03-30 VITALS — BP 116/76

## 2012-03-30 DIAGNOSIS — M75100 Unspecified rotator cuff tear or rupture of unspecified shoulder, not specified as traumatic: Secondary | ICD-10-CM

## 2012-03-30 DIAGNOSIS — M67919 Unspecified disorder of synovium and tendon, unspecified shoulder: Secondary | ICD-10-CM

## 2012-03-30 DIAGNOSIS — M25519 Pain in unspecified shoulder: Secondary | ICD-10-CM

## 2012-03-30 NOTE — Patient Instructions (Addendum)
Make sure you do your rotator cuff exercises daily for one month, then 3 times a week thereafter

## 2012-03-30 NOTE — Progress Notes (Signed)
  Subjective:    Patient ID: Samuel Chen, male    DOB: March 18, 1956, 56 y.o.   MRN: [id number]  HPI chief complaint: Followup on left shoulder  Patient comes in today for followup on his left shoulder. Doing much better after subacromial cortisone injection. Minimal pain. Good strength.    Review of Systems     Objective:   Physical Exam  Well-developed, well-nourished. No acute distress. Left shoulder shows full range of motion. No tenderness over the a.c. Joint. Minimal pain with empty can testing. Negative Hawkins. Rotator cuff strength is 5/5 and nonpainful. No tenderness over the bicipital groove. Is neurovascular intact distally.      Assessment & Plan:  1. Improved left shoulder pain secondary to rotator cuff syndrome  Patient will be shown a JOBE home exercise program. Followup when necessary

## 2012-05-27 ENCOUNTER — Ambulatory Visit: Payer: Self-pay | Admitting: Critical Care Medicine

## 2012-05-28 ENCOUNTER — Other Ambulatory Visit: Payer: Self-pay | Admitting: *Deleted

## 2012-05-28 DIAGNOSIS — J019 Acute sinusitis, unspecified: Secondary | ICD-10-CM

## 2012-05-28 MED ORDER — MOMETASONE FUROATE 50 MCG/ACT NA SUSP: 2.0000 | Freq: Every day | NASAL | Status: DC

## 2012-05-28 NOTE — Telephone Encounter (Signed)
Needs Dec appt for routine F/U with PCP

## 2012-05-29 NOTE — Telephone Encounter (Signed)
Rx faxed in.

## 2012-06-24 ENCOUNTER — Ambulatory Visit (INDEPENDENT_AMBULATORY_CARE_PROVIDER_SITE_OTHER): Payer: Self-pay | Admitting: Internal Medicine

## 2012-06-24 ENCOUNTER — Encounter: Payer: Self-pay | Admitting: Internal Medicine

## 2012-06-24 VITALS — BP 115/70 | HR 94 | Temp 97.3°F | Ht 63.0 in | Wt 142.8 lb

## 2012-06-24 DIAGNOSIS — F528 Other sexual dysfunction not due to a substance or known physiological condition: Secondary | ICD-10-CM

## 2012-06-24 DIAGNOSIS — J449 Chronic obstructive pulmonary disease, unspecified: Secondary | ICD-10-CM

## 2012-06-24 DIAGNOSIS — K219 Gastro-esophageal reflux disease without esophagitis: Secondary | ICD-10-CM

## 2012-06-24 DIAGNOSIS — E785 Hyperlipidemia, unspecified: Secondary | ICD-10-CM

## 2012-06-24 DIAGNOSIS — Z23 Encounter for immunization: Secondary | ICD-10-CM

## 2012-06-24 DIAGNOSIS — M25519 Pain in unspecified shoulder: Secondary | ICD-10-CM

## 2012-06-24 DIAGNOSIS — R05 Cough: Secondary | ICD-10-CM

## 2012-06-24 DIAGNOSIS — I10 Essential (primary) hypertension: Secondary | ICD-10-CM

## 2012-06-24 DIAGNOSIS — R059 Cough, unspecified: Secondary | ICD-10-CM

## 2012-06-24 DIAGNOSIS — J209 Acute bronchitis, unspecified: Secondary | ICD-10-CM | POA: Insufficient documentation

## 2012-06-24 DIAGNOSIS — J019 Acute sinusitis, unspecified: Secondary | ICD-10-CM

## 2012-06-24 MED ORDER — ESOMEPRAZOLE MAGNESIUM 20 MG PO CPDR: 40.0000 mg | DELAYED_RELEASE_CAPSULE | Freq: Every day | ORAL | Status: DC

## 2012-06-24 MED ORDER — ESOMEPRAZOLE MAGNESIUM 20 MG PO CPDR: 20.0000 mg | DELAYED_RELEASE_CAPSULE | Freq: Every day | ORAL | Status: DC

## 2012-06-24 MED ORDER — BENZONATATE 100 MG PO CAPS: 100.0000 mg | ORAL_CAPSULE | Freq: Three times a day (TID) | ORAL | Status: DC | PRN

## 2012-06-24 MED ORDER — LOSARTAN POTASSIUM-HCTZ 100-25 MG PO TABS: 1.0000 | ORAL_TABLET | Freq: Every day | ORAL | Status: DC

## 2012-06-24 MED ORDER — ALBUTEROL SULFATE HFA 108 (90 BASE) MCG/ACT IN AERS: 2.0000 | INHALATION_SPRAY | Freq: Four times a day (QID) | RESPIRATORY_TRACT | Status: DC | PRN

## 2012-06-24 MED ORDER — PRAVASTATIN SODIUM 40 MG PO TABS: 40.0000 mg | ORAL_TABLET | Freq: Every day | ORAL | Status: DC

## 2012-06-24 MED ORDER — MELOXICAM 15 MG PO TABS: 15.0000 mg | ORAL_TABLET | Freq: Every day | ORAL | Status: DC

## 2012-06-24 MED ORDER — FLUTICASONE-SALMETEROL 250-50 MCG/DOSE IN AEPB: 1.0000 | INHALATION_SPRAY | Freq: Two times a day (BID) | RESPIRATORY_TRACT | Status: DC

## 2012-06-24 MED ORDER — SILDENAFIL CITRATE 100 MG PO TABS: 100.0000 mg | ORAL_TABLET | ORAL | Status: DC | PRN

## 2012-06-24 MED ORDER — MOMETASONE FUROATE 50 MCG/ACT NA SUSP: 2.0000 | Freq: Every day | NASAL | Status: DC

## 2012-06-24 NOTE — Patient Instructions (Signed)
You got the flu shot today. You were provided printed refills of your medications today. You likely have bronchitis.  The cough may last for a month or so. I have given you a cough medicine prescription. Follow-up with your PCP in 2-3 months.

## 2012-06-24 NOTE — Assessment & Plan Note (Addendum)
Cough x 1 week in setting of COPD with asthmatic bronchitis, no fever chills, occasional phlegm, no dyspnea, O2 98% on room air,  continues to smoke 3/4 ppd cigarettes.  COPD exacerbation less likely.   -Tessalon Perles -Cont Advair and Albuterol -consider doxyclycline and prednisone if worsens

## 2012-06-24 NOTE — Progress Notes (Signed)
  Subjective:    Patient ID: Samuel Chen, male    DOB: 1956/06/14, 56 y.o.   MRN: [id number]  HPI Pt presents with c/o cough for the past week  Sometimes it is productive of greensish sputum but feels as though it is deep in his chest.  Hx is significant for COPD with asthmatic bronchitis on Advair and albuterol prn.  Has not had to increase his inhaler usage.  Denies fever, chills, wheezing or myalgias.  Review of Systems     Objective:   Physical Exam  Constitutional: He is oriented to person, place, and time. He appears well-developed and well-nourished. No distress.  HENT:  Head: Normocephalic and atraumatic.  Eyes: Conjunctivae normal and EOM are normal. Pupils are equal, round, and reactive to light.  Neck: Normal range of motion. Neck supple.  Cardiovascular: Normal rate, regular rhythm and normal heart sounds.   Pulmonary/Chest: Effort normal and breath sounds normal. No respiratory distress. He has no wheezes. He has no rales.  Abdominal: Soft. Bowel sounds are normal.  Neurological: He is alert and oriented to person, place, and time.  Skin: Skin is warm and dry.  Psychiatric: He has a normal mood and affect.          Assessment & Plan:  1.Acute Bronchitis: in setting of COPD but clinically does not appear to be acute exacerbation given lack of dyspnea and no significant increase in sputum production. Will treat conservatively at this point with Tessalon for cough and continued inhalers.  If symptoms worsen will consider addition of antibiotics such as doxycycline and short course of prednisone to cover COPD exacerbation.

## 2012-07-10 ENCOUNTER — Other Ambulatory Visit: Payer: Self-pay | Admitting: *Deleted

## 2012-07-10 DIAGNOSIS — J449 Chronic obstructive pulmonary disease, unspecified: Secondary | ICD-10-CM

## 2012-07-10 MED ORDER — NICOTINE 10 MG IN INHA: RESPIRATORY_TRACT | Status: DC

## 2012-08-13 ENCOUNTER — Encounter: Payer: Self-pay | Admitting: Internal Medicine

## 2012-08-14 ENCOUNTER — Other Ambulatory Visit: Payer: Self-pay | Admitting: Critical Care Medicine

## 2012-08-14 DIAGNOSIS — J449 Chronic obstructive pulmonary disease, unspecified: Secondary | ICD-10-CM

## 2012-08-14 MED ORDER — NICOTINE 10 MG IN INHA: RESPIRATORY_TRACT | Status: DC

## 2012-08-14 NOTE — Telephone Encounter (Signed)
Received faxed refill request for nicotrol inhaler from [facility].  Pt's Last OV with PW 03/25/12 -- asked to f/u in 2 months.  No pending OV. Nicotol 10 mg inhaler last sent on 07/10/2012 # 168 x 0 for 80 puffs per cartridge; 8 cartridges per day.  Dr. Delford Field, pls advise if rx ok.  Thank you.

## 2012-08-14 NOTE — Telephone Encounter (Signed)
This is ok

## 2012-08-14 NOTE — Telephone Encounter (Signed)
rx faxed

## 2012-09-24 ENCOUNTER — Ambulatory Visit: Payer: Self-pay

## 2012-10-09 ENCOUNTER — Telehealth: Payer: Self-pay | Admitting: Critical Care Medicine

## 2012-10-09 NOTE — Telephone Encounter (Signed)
Called and spoke with dawn at the pharmacy and she stated that the pt has been on the same dose since august 8,2013 and she wanted to see if PW wanted to change the quantity for the pt due to his other health problems.  PW please advise. thanks

## 2012-10-09 NOTE — Telephone Encounter (Signed)
Called, spoke with Samuel Chen with the [employer].  Informed her of below per Dr. Delford Field.  She verbalized understanding.  She will remind Samuel Chen to call office for appt if he hasn't spoken with [place] prior to picking up rx.  Samuel Chen has no pending appt.  I called # listed as his cell # -- [email or web address] I called # listed as his home # - left msg with Samuel Chen's mother, Samuel Chen, to have Samuel Chen call back Samuel Chen will need to schedule OV with Dr. Delford Field.

## 2012-10-09 NOTE — Telephone Encounter (Signed)
I need to see the pt in the office to sort this out  He is significantly addicted to nicotine and I am ok with his continuance for now

## 2012-10-12 NOTE — Telephone Encounter (Signed)
lmtcb x2 for pt. 

## 2012-10-13 NOTE — Telephone Encounter (Signed)
LMTCB

## 2012-10-14 NOTE — Telephone Encounter (Signed)
Pt called and scheduled appt with melanie. Will sign off message

## 2012-11-17 ENCOUNTER — Other Ambulatory Visit: Payer: Self-pay | Admitting: *Deleted

## 2012-11-17 DIAGNOSIS — J019 Acute sinusitis, unspecified: Secondary | ICD-10-CM

## 2012-11-17 MED ORDER — MOMETASONE FUROATE 50 MCG/ACT NA SUSP: 2.0000 | Freq: Every day | NASAL | Status: DC

## 2012-11-18 NOTE — Telephone Encounter (Signed)
Rx called in to pharmacy. 

## 2012-11-19 ENCOUNTER — Ambulatory Visit: Payer: Self-pay

## 2012-11-30 ENCOUNTER — Ambulatory Visit (INDEPENDENT_AMBULATORY_CARE_PROVIDER_SITE_OTHER): Payer: No Typology Code available for payment source | Admitting: Critical Care Medicine

## 2012-11-30 ENCOUNTER — Encounter: Payer: Self-pay | Admitting: Critical Care Medicine

## 2012-11-30 VITALS — BP 130/76 | HR 104 | Temp 98.8°F | Ht 63.0 in | Wt 149.4 lb

## 2012-11-30 DIAGNOSIS — J449 Chronic obstructive pulmonary disease, unspecified: Secondary | ICD-10-CM

## 2012-11-30 DIAGNOSIS — J019 Acute sinusitis, unspecified: Secondary | ICD-10-CM

## 2012-11-30 DIAGNOSIS — F17200 Nicotine dependence, unspecified, uncomplicated: Secondary | ICD-10-CM

## 2012-11-30 MED ORDER — PREDNISONE 10 MG PO TABS: ORAL_TABLET | ORAL | Status: DC

## 2012-11-30 MED ORDER — FLUTICASONE-SALMETEROL 250-50 MCG/DOSE IN AEPB: 1.0000 | INHALATION_SPRAY | Freq: Two times a day (BID) | RESPIRATORY_TRACT | Status: DC

## 2012-11-30 MED ORDER — ALBUTEROL SULFATE HFA 108 (90 BASE) MCG/ACT IN AERS: 2.0000 | INHALATION_SPRAY | Freq: Four times a day (QID) | RESPIRATORY_TRACT | Status: DC | PRN

## 2012-11-30 MED ORDER — MOMETASONE FUROATE 50 MCG/ACT NA SUSP: 2.0000 | Freq: Every day | NASAL | Status: DC

## 2012-11-30 MED ORDER — AMOXICILLIN-POT CLAVULANATE 875-125 MG PO TABS: 1.0000 | ORAL_TABLET | Freq: Two times a day (BID) | ORAL | Status: DC

## 2012-11-30 NOTE — Assessment & Plan Note (Signed)
History of COPD gold stage a with ongoing smoking use Plan Refills on all meds given, printed out Prednisone 10mg  Take 4 for three days 3 for three days 2 for three days 1 for three days and stop Augmentin one twice daily for 10days Focus on smoking cessation with nicotrol inhaler Return 4 months  3-10 minutes of smoking cessation counseling was given

## 2012-11-30 NOTE — Progress Notes (Signed)
Subjective:    Patient ID: Samuel Chen, male    DOB: Jan 04, 1956, 57 y.o.   MRN: [id number]  HPI  11/30/2012 Pt notes two weeks of cough and more dyspnea.  Notes some wheezing.  Mucus is brown green.  Still smoking 1/2- 3/4 PPD.  Not able to quit smoking No chest pain .  No fever chills  Sweats.  Notes some nasal pressure, notes some pndrip   Review of Systems  11 point review of system reviewed and is negative except as per history present illness    Objective:   Physical Exam  Filed Vitals:   11/30/12 1507  BP: 130/76  Pulse: 104  Temp: 98.8 F (37.1 C)  TempSrc: Oral  Height: 5\' 3"  (1.6 m)  Weight: 149 lb 6.4 oz (67.767 kg)  SpO2: 97%    Gen: Pleasant, well-nourished, in no distress,  normal affect  ENT: No lesions,  mouth clear,  oropharynx clear,++ postnasal , bilateral nasal purulence  Neck: No JVD, no TMG, no carotid bruits  Lungs: No use of accessory muscles, no dullness to percussion, expired wheezes and scattered rhonchi  Cardiovascular: RRR, heart sounds normal, no murmur or gallops, no peripheral edema  Abdomen: soft and NT, no HSM,  BS normal  Musculoskeletal: No deformities, no cyanosis or clubbing  Neuro: alert, non focal  Skin: Warm, no lesions or rashes  No results found.     Assessment & Plan:   COPD (chronic obstructive pulmonary disease)Gold A History of COPD gold stage a with ongoing smoking use Plan Refills on all meds given, printed out Prednisone 10mg  Take 4 for three days 3 for three days 2 for three days 1 for three days and stop Augmentin one twice daily for 10days Focus on smoking cessation with nicotrol inhaler Return 4 months  3-10 minutes of smoking cessation counseling was given    Updated Medication List Outpatient Encounter Prescriptions as of 11/30/2012  Medication Sig Dispense Refill  . clobetasol ointment (TEMOVATE) 0.05 % Apply topically 2 (two) times daily as needed. Apply to the itchy areas on legs twice a  day for seven days  60 g  3  . esomeprazole (NEXIUM) 20 MG capsule Take 1 capsule (20 mg total) by mouth daily.  90 capsule  3  . Fluticasone-Salmeterol (ADVAIR) 250-50 MCG/DOSE AEPB Inhale 1 puff into the lungs 2 (two) times daily.  1 each  11  . losartan-hydrochlorothiazide (HYZAAR) 100-25 MG per tablet Take 1 tablet by mouth daily.  30 tablet  6  . meloxicam (MOBIC) 15 MG tablet Take 1 tablet (15 mg total) by mouth daily.  30 tablet  4  . mometasone (NASONEX) 50 MCG/ACT nasal spray Place 2 sprays into the nose daily. One in each nostril  17 g  5  . pravastatin (PRAVACHOL) 40 MG tablet Take 1 tablet (40 mg total) by mouth daily.  30 tablet  6  . sildenafil (VIAGRA) 100 MG tablet Take 1 tablet (100 mg total) by mouth as needed for erectile dysfunction.  30 tablet  5  . [DISCONTINUED] albuterol (PROVENTIL HFA;VENTOLIN HFA) 108 (90 BASE) MCG/ACT inhaler Inhale 2 puffs into the lungs every 6 (six) hours as needed for wheezing or shortness of breath.  1 Inhaler  2  . [DISCONTINUED] albuterol (PROVENTIL,VENTOLIN) 90 MCG/ACT inhaler Inhale 2 puffs into the lungs every 4 (four) hours as needed for wheezing. Inhale 1 -2 puffs every 4 hours as needed for SOB  17 g  11  . [DISCONTINUED] Fluticasone-Salmeterol (  ADVAIR) 250-50 MCG/DOSE AEPB Inhale 1 puff into the lungs 2 (two) times daily.  1 each  11  . [DISCONTINUED] mometasone (NASONEX) 50 MCG/ACT nasal spray Place 2 sprays into the nose daily. One in each nostril  17 g  5  . albuterol (PROVENTIL HFA;VENTOLIN HFA) 108 (90 BASE) MCG/ACT inhaler Inhale 2 puffs into the lungs every 6 (six) hours as needed for wheezing.  1 Inhaler  6  . amoxicillin-clavulanate (AUGMENTIN) 875-125 MG per tablet Take 1 tablet by mouth 2 (two) times daily.  20 tablet  0  . benzonatate (TESSALON) 100 MG capsule Take 1 capsule (100 mg total) by mouth 3 (three) times daily as needed for cough.  30 capsule  0  . predniSONE (DELTASONE) 10 MG tablet Take 4 for three days 3 for three days  2 for three days 1 for three days and stop  30 tablet  0  . [DISCONTINUED] nicotine (NICOTROL) 10 MG inhaler 80 puff per cartridge , 8 cartridges per day  168 each  0  . [DISCONTINUED] predniSONE (DELTASONE) 10 MG tablet Take 4 tablets daily for 5 days then stop  20 tablet  0   Facility-Administered Encounter Medications as of 11/30/2012  Medication Dose Route Frequency Provider Last Rate Last Dose  . methylPREDNISolone acetate (DEPO-MEDROL) injection 40 mg  40 mg Intra-Lesional Once Sherrell Puller, MD

## 2012-11-30 NOTE — Patient Instructions (Addendum)
Refills on all meds given, printed out Prednisone 10mg  Take 4 for three days 3 for three days 2 for three days 1 for three days and stop Augmentin one twice daily for 10days Focus on smoking cessation with nicotrol inhaler Return 4 months

## 2012-12-01 ENCOUNTER — Other Ambulatory Visit: Payer: Self-pay | Admitting: *Deleted

## 2012-12-01 DIAGNOSIS — K219 Gastro-esophageal reflux disease without esophagitis: Secondary | ICD-10-CM

## 2012-12-02 ENCOUNTER — Encounter: Payer: Self-pay | Admitting: Internal Medicine

## 2012-12-02 DIAGNOSIS — K219 Gastro-esophageal reflux disease without esophagitis: Secondary | ICD-10-CM

## 2012-12-02 MED ORDER — ESOMEPRAZOLE MAGNESIUM 20 MG PO CPDR: 20.0000 mg | DELAYED_RELEASE_CAPSULE | Freq: Every day | ORAL | Status: DC

## 2012-12-02 NOTE — Telephone Encounter (Signed)
This encounter was created in error - please disregard.

## 2012-12-08 NOTE — Telephone Encounter (Signed)
Rx called in to pharmacy. 

## 2012-12-24 ENCOUNTER — Ambulatory Visit (INDEPENDENT_AMBULATORY_CARE_PROVIDER_SITE_OTHER): Payer: No Typology Code available for payment source | Admitting: Sports Medicine

## 2012-12-24 ENCOUNTER — Ambulatory Visit (HOSPITAL_COMMUNITY)
Admission: RE | Admit: 2012-12-24 | Discharge: 2012-12-24 | Disposition: A | Discharge status: Home / Self Care | Payer: No Typology Code available for payment source | Source: Ambulatory Visit | Attending: Sports Medicine | Admitting: Sports Medicine

## 2012-12-24 ENCOUNTER — Other Ambulatory Visit (INDEPENDENT_AMBULATORY_CARE_PROVIDER_SITE_OTHER): Payer: No Typology Code available for payment source

## 2012-12-24 VITALS — BP 125/85 | Ht 63.0 in | Wt 145.0 lb

## 2012-12-24 DIAGNOSIS — M25551 Pain in right hip: Secondary | ICD-10-CM

## 2012-12-24 DIAGNOSIS — M25559 Pain in unspecified hip: Secondary | ICD-10-CM

## 2012-12-24 DIAGNOSIS — M25859 Other specified joint disorders, unspecified hip: Secondary | ICD-10-CM | POA: Insufficient documentation

## 2012-12-24 LAB — COMPLETE METABOLIC PANEL WITH GFR
ALT: 34 U/L (ref 0–53)
AST: 22 U/L (ref 0–37)
Calcium: 9.6 mg/dL (ref 8.4–10.5)
Chloride: 99 mEq/L (ref 96–112)
Creat: 1.01 mg/dL (ref 0.50–1.35)
Total Bilirubin: 0.3 mg/dL (ref 0.3–1.2)

## 2012-12-24 MED ORDER — METHYLPREDNISOLONE ACETATE 80 MG/ML IJ SUSP
80.0000 mg | Freq: Once | INTRAMUSCULAR | Status: AC
  Administered 2012-12-24: 80 mg via INTRAMUSCULAR

## 2012-12-24 NOTE — Progress Notes (Addendum)
  Subjective:    Patient ID: Samuel Chen, male    DOB: May 03, 1956, 57 y.o.   MRN: [id number]  HPI chief complaint: Right hip pain  Patient comes in today complaining of 2 weeks of right hip pain. Pain began while he was at work. While wiping down a counter he felt his right leg give way. Since then he has had pain in the right groin. Pain will occasionally radiate down the right thigh to the knee as well as to the lateral hip. Denies associated numbness or tingling. Denies any prior occurrences. His pain is worse with activity and weightbearing and improves at rest but never completely resolves. He has tried over-the-counter icy hot without any pain relief. He is also complaining of intermittent diffuse cramping in his arms and legs. This is unrelated to the above complaint. Cramping has been present now intermittently for several years.  Interim medical history is reviewed. Current medications are reviewed. History of COPD    Review of Systems     Objective:   Physical Exam Well-developed, well-nourished. No acute distress. Awake alert and oriented x3  Right hip: Patient has full passive internal and external rotation while sitting but does have reproducible groin pain with both planes of motion. He also has pain with resisted hip flexion. There is some tenderness to palpation along the abductor tendon but no palpable defect. No soft tissue swelling. He does have some weakness with resisted hip flexion but this is mainly due to pain. Negative straight leg raise. No tenderness over the greater trochanteric bursa. Neurovascularly intact distally.       Assessment & Plan:  1. Right groin pain-rule out AVN 2. Chronic intermittent body cramps  AP pelvis and lateral right hip specifically to rule out AVN since the patient has a history of COPD and has been on oral prednisone in the past. He also has a history of alcohol abuse which would place him at increased risk of AVN as well. I've  injected him with 80 mg of Depo-Medrol IM for pain relief and I will send him for blood work, specifically a CMP and magnesium level to rule out electrolyte reasons for his cramping. Once I reviewed his blood work and his x-rays I will call him and we will delineate further treatment based on those findings and his response to today's cortisone injection.

## 2012-12-25 ENCOUNTER — Other Ambulatory Visit: Payer: Self-pay | Admitting: *Deleted

## 2012-12-25 ENCOUNTER — Telehealth: Payer: Self-pay | Admitting: Sports Medicine

## 2012-12-25 MED ORDER — CYCLOBENZAPRINE HCL 10 MG PO TABS: 10.0000 mg | ORAL_TABLET | Freq: Two times a day (BID) | ORAL | Status: DC | PRN

## 2012-12-25 NOTE — Addendum Note (Signed)
Addended by: Annita Brod on: 12/25/2012 09:43 AM   Modules accepted: Orders

## 2012-12-25 NOTE — Telephone Encounter (Signed)
I spoke with the patient on the phone today regarding x-rays of his right hip. There is subtle patchy heterogeneous attenuation of the right femoral head which could suggest early AVN. Patient certainly has risk factors for AVN. Therefore, I want to order an MRI scan to investigate this further. He is requesting a muscle relaxants or for cramping in his right leg. I will give him a prescription for Flexeril 10 mg with instructions to take one half to one twice daily as needed for spasm #20 with no refills. Patient will need to followup with me in the office after his MRI to delineate further workup and treatment.

## 2012-12-30 ENCOUNTER — Ambulatory Visit
Admission: RE | Admit: 2012-12-30 | Discharge: 2012-12-30 | Disposition: A | Discharge status: Home / Self Care | Payer: No Typology Code available for payment source | Source: Ambulatory Visit | Attending: Sports Medicine | Admitting: Sports Medicine

## 2012-12-30 DIAGNOSIS — M25551 Pain in right hip: Secondary | ICD-10-CM

## 2012-12-31 ENCOUNTER — Telehealth: Payer: Self-pay | Admitting: *Deleted

## 2012-12-31 NOTE — Telephone Encounter (Signed)
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

## 2012-12-31 NOTE — Telephone Encounter (Signed)
Called [facility] made referral- they requested pt's records before scheduling appt.  Records faxed.  [facility] will call pt to schedule appt.

## 2013-01-01 ENCOUNTER — Other Ambulatory Visit: Payer: Self-pay | Admitting: *Deleted

## 2013-01-01 MED ORDER — TRAMADOL HCL 50 MG PO TABS: 50.0000 mg | ORAL_TABLET | Freq: Three times a day (TID) | ORAL | Status: DC | PRN

## 2013-01-05 ENCOUNTER — Telehealth: Payer: Self-pay | Admitting: *Deleted

## 2013-01-05 NOTE — Telephone Encounter (Signed)
Pt scheduled for appt at [facility] 02/02/13 @ 9:15am.  Pt notified of $150 up front payment, if he cannot pay - he should call financial counselor at their office [phone].  If does not call them to make arrangements, advised they will not see him.  Pt expressed understanding.

## 2013-01-11 ENCOUNTER — Ambulatory Visit: Payer: No Typology Code available for payment source

## 2013-01-21 ENCOUNTER — Ambulatory Visit (INDEPENDENT_AMBULATORY_CARE_PROVIDER_SITE_OTHER): Payer: No Typology Code available for payment source | Admitting: Internal Medicine

## 2013-01-21 ENCOUNTER — Encounter: Payer: Self-pay | Admitting: Internal Medicine

## 2013-01-21 VITALS — BP 124/75 | HR 82 | Temp 97.7°F | Resp 20 | Ht 63.5 in | Wt 145.5 lb

## 2013-01-21 DIAGNOSIS — M87051 Idiopathic aseptic necrosis of right femur: Secondary | ICD-10-CM

## 2013-01-21 DIAGNOSIS — M87059 Idiopathic aseptic necrosis of unspecified femur: Secondary | ICD-10-CM

## 2013-01-21 DIAGNOSIS — I10 Essential (primary) hypertension: Secondary | ICD-10-CM

## 2013-01-21 DIAGNOSIS — F1010 Alcohol abuse, uncomplicated: Secondary | ICD-10-CM

## 2013-01-21 DIAGNOSIS — R252 Cramp and spasm: Secondary | ICD-10-CM

## 2013-01-21 DIAGNOSIS — F17200 Nicotine dependence, unspecified, uncomplicated: Secondary | ICD-10-CM

## 2013-01-21 NOTE — Assessment & Plan Note (Signed)
States he drinks only occasionally but not every day.

## 2013-01-21 NOTE — Assessment & Plan Note (Signed)
Complaining of muscle cramps throughout his body, but especially in his hands and feet have been occurring over the past 3 weeks. He denies any recent changes to his medications. 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 He had a CMP drawn on 12/24/12, which was completely normal.  He was encouraged to drink lots of fluids, especially water, and avoid alcohol.

## 2013-01-21 NOTE — Assessment & Plan Note (Signed)
BP Readings from Last 3 Encounters:  01/21/13 124/75  12/24/12 125/85  11/30/12 130/76    Lab Results  Component Value Date   NA 136 12/24/2012   K 4.1 12/24/2012   CREATININE 1.01 12/24/2012    Assessment: Blood pressure control: controlled Progress toward BP goal:  at goal Comments:   Plan: Medications:  continue current medications Educational resources provided:   Self management tools provided:   Other plans:

## 2013-01-21 NOTE — Assessment & Plan Note (Signed)
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 He was referred to [facility] for possible surgery, appt 02/02/13. Taking Tramadol for pain.

## 2013-01-21 NOTE — Patient Instructions (Signed)
**  Stay hydrated**  Muscle Cramps Muscle cramps are due to sudden involuntary muscle contraction. This means you have no control over the tightening of a muscle (or muscles). Often there are no obvious causes. Muscle cramps may occur with overexertion. They may also occur with chilling of the muscles. An example of a muscle chilling activity is swimming. It is uncommon for cramps to be due to a serious underlying disorder. In most cases, muscle cramps improve (or leave) within minutes. CAUSES  Some common causes are:  Injury.  Infections, especially viral.  Abnormal levels of the salts and ions in your blood (electrolytes). This could happen if you are taking water pills (diuretics).  Blood vessel disease where not enough blood is getting to the muscles (intermittent claudication). Some uncommon causes are:  Side effects of some medicine (such as lithium).  Alcohol abuse.  Diseases where there is soreness (inflammation) of the muscular system. HOME CARE INSTRUCTIONS   It may be helpful to massage, stretch, and relax the affected muscle.  Taking a dose of over-the-counter diphenhydramine is helpful for night leg cramps. SEEK MEDICAL CARE IF:  Cramps are frequent and not relieved with medicine. MAKE SURE YOU:   Understand these instructions.  Will watch your condition.  Will get help right away if you are not doing well or get worse. Document Released: 02/08/2002 Document Revised: 11/11/2011 Document Reviewed: 08/10/2008 [facility] Patient Information 2014 [place], [place].

## 2013-01-21 NOTE — Assessment & Plan Note (Signed)
  Assessment: Progress toward smoking cessation:  smoking more Barriers to progress toward smoking cessation:  withdrawal symptoms Comments: Smoking b/w 0.8- 1.5ppd, unable to quit despite multiple attempts, even tried Chantix.  Plan: Instruction/counseling given:  I counseled patient on the dangers of tobacco use, advised patient to stop smoking and reviewed strategies to maximize success. Educational resources provided:  [facility] (1-800-QUIT-NOW) brochure;smoking cessation handout (tips, strategies, fact sheets) Self management tools provided:  smoking cessation plan (STAR Quit Plan) Medications to assist with smoking cessation:  None Patient agreed to the following self-care plans for smoking cessation: call [facility] (1-800-QUIT-NOW);set a quit date and stop smoking (Nicotine cigarette)  Other plans:

## 2013-01-21 NOTE — Progress Notes (Signed)
Patient ID: Samuel Chen, male   DOB: Nov 07, 1955, 57 y.o.   MRN: [id number]  Subjective:   Patient ID: Samuel Chen male   DOB: [birth year] 57 y.o.   MRN: [id number]  HPI: Samuel Chen is a 57 y.o. male with PMH COPD Gold A, HTN, HLD, and recently diagnosed AVN, presents to [facility] with complaints of muscle cramps.  The cramping has been present for 3 weeks , occurs throughout his body, but is worse in his hands and feet. 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 He denies any recent changes to medications. He denies excessive alcohol use, and states he only drinks occasionally. He states he is watching his diet and salt intake. He had a CMP and Mg from 12/24/12 which were all very normal.  He has been seeing sports medicine, Dr. Margaretha Sheffield, and complained of right hip pain. 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 He's been referred to [facility] for surgical evaluation, which is scheduled for 02/02/13.  He does continue to smoke despite multiple attempts at cessation, including Chantix use. He says he smoking anywhere from less than a pack to a pack and half per day. He states that he feels he quit once he has his surgery and is immobile.   Past Medical History  Diagnosis Date  . GERD (gastroesophageal reflux disease)   . Hypertension   . Hyperlipidemia   . COPD (chronic obstructive pulmonary disease)     Questionable diagnosis.  . 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 Myalgia 11/2007    Likely 2/2 Pravachol  . Dyshidrotic eczema   . Tobacco abuse   . Allergic rhinitis    Current Outpatient Prescriptions  Medication Sig Dispense Refill  . albuterol (PROVENTIL HFA;VENTOLIN HFA) 108 (90 BASE) MCG/ACT inhaler Inhale 2 puffs into the lungs every 6 (six) hours as needed for wheezing.  1 Inhaler  6  . esomeprazole (NEXIUM) 20 MG capsule Take 1 capsule (20 mg  total) by mouth daily.  90 capsule  3  . losartan-hydrochlorothiazide (HYZAAR) 100-25 MG per tablet Take 1 tablet by mouth daily.  30 tablet  6  . meloxicam (MOBIC) 15 MG tablet Take 1 tablet (15 mg total) by mouth daily.  30 tablet  4  . mometasone (NASONEX) 50 MCG/ACT nasal spray Place 2 sprays into the nose daily. One in each nostril  17 g  5  . pravastatin (PRAVACHOL) 40 MG tablet Take 1 tablet (40 mg total) by mouth daily.  30 tablet  6  . traMADol (ULTRAM) 50 MG tablet Take 1 tablet (50 mg total) by mouth every 8 (eight) hours as needed for pain.  40 tablet  0  . clobetasol ointment (TEMOVATE) 0.05 % Apply topically 2 (two) times daily as needed. Apply to the itchy areas on legs twice a day for seven days  60 g  3  . cyclobenzaprine (FLEXERIL) 10 MG tablet Take 1 tablet (10 mg total) by mouth 2 (two) times daily as needed for muscle spasms.  20 tablet  0  . Fluticasone-Salmeterol (ADVAIR) 250-50 MCG/DOSE AEPB Inhale 1 puff into the lungs 2 (two) times daily.  1 each  11  . sildenafil (VIAGRA) 100 MG tablet Take 1 tablet (100 mg total) by mouth as needed for erectile dysfunction.  30 tablet  5   Current Facility-Administered Medications  Medication Dose Route Frequency Provider Last Rate Last Dose  . methylPREDNISolone acetate (DEPO-MEDROL) injection 40 mg  40 mg Intra-Lesional Once Sherrell Puller, MD       Family History  Problem Relation Age of Onset  . Heart disease Sister   . Heart attack Sister     34-35yo  . Heart disease Father    History   Social History  . Marital Status: Single    Spouse Name: N/A    Number of Children: N/A  . Years of Education: N/A   Social History Main Topics  . Smoking status: Current Every Day Smoker -- 1.50 packs/day    Types: Cigarettes    Start date: 06/03/1983  . Smokeless tobacco: Never Used     Comment: Started smoking at age 10. Currently smoking 3/4 ppd.   . Alcohol Use: Yes     Comment: 6 to 7 beers daily x 25 years at least   .  Drug Use: No  . Sexually Active: Yes   Other Topics Concern  . Not on file   Social History Narrative   Financial assistance approved for 100% discount at [facility] and has [facility] card per Rudell Cobb October 13,2011 4:21PM   Review of Systems: A 10 point ROS was performed; pertinent positives and negatives were noted in the HPI   Objective:  Physical Exam: Filed Vitals:   01/21/13 1550  BP: 124/75  Pulse: 82  Temp: 97.7 F (36.5 C)  TempSrc: Oral  Resp: 20  Height: 5' 3.5" (1.613 m)  Weight: 145 lb 8 oz (65.998 kg)  SpO2: 97%   Constitutional: Vital signs reviewed.  Patient is a well-developed and well-nourished male in no acute distress and cooperative with exam.  Head: Normocephalic and atraumatic Mouth: No erythema or exudates, MMM Eyes: PERRL, EOMI, conjunctivae normal, No scleral icterus.  Neck: Supple, Trachea midline normal ROM, No JVD, mass, thyromegaly Cardiovascular: RRR, S1 normal, S2 normal, no MRG, pulses symmetric and intact bilaterally Pulmonary/Chest: Course breath sounds diffusely on the right, with wheezing.  Abdominal: Soft. Non-tender, non-distended, bowel sounds are normal, no masses, organomegaly, or guarding present.  Musculoskeletal: Ambulates with slight limp on right side, no edema. Hematology: No cervical adenopathy.  Neurological: A&O x3, Strength is normal and symmetric bilaterally, non-focal Skin: Warm, dry and intact. No rash, cyanosis, or clubbing.  Psychiatric: Normal mood and affect.   Assessment & Plan:   Please refer to Problem List based Assessment and Plan

## 2013-01-22 LAB — CBC

## 2013-01-22 LAB — ANEMIA PANEL
%SAT: 23 % (ref 20–55)
Iron: 76 ug/dL (ref 42–165)
TIBC: 334 ug/dL (ref 215–435)
UIBC: 258 ug/dL (ref 125–400)

## 2013-01-22 NOTE — Progress Notes (Signed)
Case discussed with Dr. Glenn soon after the resident saw the patient.  We reviewed the resident's history and exam and pertinent patient test results.  I agree with the assessment, diagnosis and plan of care documented in the resident's note. 

## 2013-02-02 ENCOUNTER — Other Ambulatory Visit: Payer: No Typology Code available for payment source

## 2013-02-04 ENCOUNTER — Other Ambulatory Visit (INDEPENDENT_AMBULATORY_CARE_PROVIDER_SITE_OTHER): Payer: No Typology Code available for payment source

## 2013-02-04 DIAGNOSIS — R252 Cramp and spasm: Secondary | ICD-10-CM

## 2013-02-04 LAB — CBC
HCT: 38.6 % — ABNORMAL LOW (ref 39.0–52.0)
MCHC: 35 g/dL (ref 30.0–36.0)
MCV: 88.7 fL (ref 78.0–100.0)
RDW: 13.1 % (ref 11.5–15.5)

## 2013-02-04 LAB — RETICULOCYTES: ABS Retic: 69.2 10*3/uL (ref 19.0–186.0)

## 2013-03-10 ENCOUNTER — Other Ambulatory Visit: Payer: Self-pay | Admitting: *Deleted

## 2013-03-10 NOTE — Telephone Encounter (Signed)
Received fax from [facility] Dept wanting to know if pt should still be taking the nicotrol inhaler. Per PW, yes pt should be and he ok'ed rxs. I have faxed this back to [facility] Dept and placed form in scan folder.

## 2013-04-13 ENCOUNTER — Ambulatory Visit (INDEPENDENT_AMBULATORY_CARE_PROVIDER_SITE_OTHER): Payer: No Typology Code available for payment source | Admitting: Internal Medicine

## 2013-04-13 ENCOUNTER — Encounter: Payer: Self-pay | Admitting: Internal Medicine

## 2013-04-13 VITALS — BP 138/84 | HR 95 | Temp 97.5°F | Ht 64.5 in | Wt 144.3 lb

## 2013-04-13 DIAGNOSIS — R590 Localized enlarged lymph nodes: Secondary | ICD-10-CM | POA: Insufficient documentation

## 2013-04-13 DIAGNOSIS — R599 Enlarged lymph nodes, unspecified: Secondary | ICD-10-CM

## 2013-04-13 MED ORDER — AMOXICILLIN 500 MG PO CAPS: 500.0000 mg | ORAL_CAPSULE | Freq: Three times a day (TID) | ORAL | Status: AC

## 2013-04-13 MED ORDER — CLINDAMYCIN HCL 300 MG PO CAPS: 300.0000 mg | ORAL_CAPSULE | Freq: Three times a day (TID) | ORAL | Status: AC

## 2013-04-13 NOTE — Progress Notes (Signed)
Patient ID: TERALD JUMP, male   DOB: June 25, 1956, 57 y.o.   MRN: [id number]   Subjective:   Patient ID: Vallie Teters Schleyer male   DOB: 1955/10/28 57 y.o.   MRN: [id number]  HPI: Mr.Dontario K Vokes is a 57 y.o. with PMH- HTN, Hyperlipidemia, Avascular necrosis of the Femur head- with surgery 3 weeks ago, COPD, presents to the [facility] today with swelling on the Lt side of his neck. Noticed the swelling initially as pain on the left side of his neck. No swelling of any other part of a his body, no previous hx, no fever, no sore throat, no recent change in medications ,no significant weightloss, no night sweats, no difficulty breathing or swallowing, no hoarse or change in voice. Pt stopped smoking a few weeks ago, was previously smoking 1-2 packs a day for about 30 years, pt had a surgery 3 weeks ago, but was not intubated. Pt does report that he had tooth pain about 2 weeks ago, but has now resolved. Had surgery 3 weeks ago here at [facility], for avascular necrosis of the Rt hip. The patient is taking 324 of aspirin, as blood thinner, as he said he can not afford Xarelto.   Past Medical History  Diagnosis Date  . GERD (gastroesophageal reflux disease)   . Hypertension   . Hyperlipidemia   . COPD (chronic obstructive pulmonary disease)     Questionable diagnosis.  . 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 Myalgia 11/2007    Likely 2/2 Pravachol  . Dyshidrotic eczema   . Tobacco abuse   . Allergic rhinitis    Current Outpatient Prescriptions  Medication Sig Dispense Refill  . albuterol (PROVENTIL HFA;VENTOLIN HFA) 108 (90 BASE) MCG/ACT inhaler Inhale 2 puffs into the lungs every 6 (six) hours as needed for wheezing.  1 Inhaler  6  . clobetasol ointment (TEMOVATE) 0.05 % Apply topically 2 (two) times daily as needed. Apply to the itchy areas on legs twice a day for seven days  60 g  3  . cyclobenzaprine (FLEXERIL) 10 MG tablet Take 1 tablet (10 mg total) by mouth 2 (two) times daily  as needed for muscle spasms.  20 tablet  0  . esomeprazole (NEXIUM) 20 MG capsule Take 1 capsule (20 mg total) by mouth daily.  90 capsule  3  . Fluticasone-Salmeterol (ADVAIR) 250-50 MCG/DOSE AEPB Inhale 1 puff into the lungs 2 (two) times daily.  1 each  11  . losartan-hydrochlorothiazide (HYZAAR) 100-25 MG per tablet Take 1 tablet by mouth daily.  30 tablet  6  . meloxicam (MOBIC) 15 MG tablet Take 1 tablet (15 mg total) by mouth daily.  30 tablet  4  . mometasone (NASONEX) 50 MCG/ACT nasal spray Place 2 sprays into the nose daily. One in each nostril  17 g  5  . nicotine (NICOTROL) 10 MG inhaler 80 puffs per cartridge 8 cartridges per day  168 cartridge  3  . pravastatin (PRAVACHOL) 40 MG tablet Take 1 tablet (40 mg total) by mouth daily.  30 tablet  6  . sildenafil (VIAGRA) 100 MG tablet Take 1 tablet (100 mg total) by mouth as needed for erectile dysfunction.  30 tablet  5  . traMADol (ULTRAM) 50 MG tablet Take 1 tablet (50 mg total) by mouth every 8 (eight) hours as needed for pain.  40 tablet  0   Current Facility-Administered Medications  Medication Dose Route Frequency Provider Last Rate Last Dose  .  methylPREDNISolone acetate (DEPO-MEDROL) injection 40 mg  40 mg Intra-Lesional Once Sherrell Puller, MD       Family History  Problem Relation Age of Onset  . Heart disease Sister   . Heart attack Sister     34-35yo  . Heart disease Father    History   Social History  . Marital Status: Single    Spouse Name: N/A    Number of Children: N/A  . Years of Education: N/A   Social History Main Topics  . Smoking status: Former Smoker -- 1.50 packs/day    Types: Cigarettes    Start date: 06/03/1983    Quit date: 03/04/2013  . Smokeless tobacco: Not on file     Comment: Started smoking at age 29. Currently smoking 3/4 ppd. PATIENT IS NOW USING E CIGARETTES  . Alcohol Use: Yes     Comment: 6 to 7 beers daily x 25 years at least   . Drug Use: No  . Sexually Active: Yes   Other  Topics Concern  . Not on file   Social History Narrative   Financial assistance approved for 100% discount at [facility] and has [facility] card per Rudell Cobb October 13,2011 4:21PM   Review of Systems: No pertinent findings on ROS.  Objective:  Physical Exam: Filed Vitals:   04/13/13 1329  BP: 138/84  Pulse: 95  Temp: 97.5 F (36.4 C)  TempSrc: Oral  Height: 5' 4.5" (1.638 m)  Weight: 144 lb 4.8 oz (65.454 kg)  SpO2: 93%   General appearance: alert, cooperative, appears stated age and no distress Head: Normocephalic, without obvious abnormality, atraumatic Eyes: conjunctivae/corneas clear. PERRL, EOM's intact. Fundi benign. Nose: Nares normal. Septum midline. Mucosa normal. No drainage or sinus tenderness. Throat: lips, mucosa, and tongue normal; teeth- poor dentition overall, missing some tooth, and gums normal. Neck: 3 by 3 cm mobile soft- firm mobile mass on Lt side, about 2cm below the angle of the jaw, not fluctuant, tender to palpation, no bruit on auscultation on the mass, no carotid bruit, supple, symmetrical, trachea midline and thyroid not enlarged. No other cervical, axillary or inguinal LN enlargement. Back: symmetric, no curvature. ROM normal. No CVA tenderness. Lungs: clear to auscultation bilaterally. Heart: regular rate and rhythm, S1, S2 normal, no murmur, click, rub or gallop Abdomen: soft, non-tender; bowel sounds normal; no masses,  no organomegaly. Skin: Skin color, texture, turgor normal. No rashes or lesions.   Assessment & Plan:   Reactive Lymphadenopathy- Most likley cervical lymphadenopathy, considering swellingis tender and short duration, with hx of tooth pain-likely previous periodontal or dental inflammation as pt reports previous tooth pain -2 weeks ago. Pt was told not to have dental work done with in the next 3 months, possible consideration of seeding of the prosthetic joint by bacteremia if tooth manipulatuon is done. It appears pt might need dental  work. Call placed to ID to ask for opinion as to how early dental work should be done- recommendations- there is no specific duration, but the longer pt waits the better, in the interim, if pt is not markedly symptomatic antibiotics can be given for a week or till current symptoms resolve. - Pt D/c home on Amoxicillin- 500mg  TID X7days. - And to cover anaerobs- Clindamycin- 300mg  TID [id number]. - Pt advised if swelling does not resolve in 1 month he should come back to the clinic- other considerations are- Lymphoma- but lone LN swelling and lack of systemic findings, make this unlikely. Malignancy- considering pt signif smoking  hx. Would consider imaging- Ct neck if swelling does not resolve.

## 2013-04-13 NOTE — Patient Instructions (Addendum)
We believe you neck swelling is a swollen lymph node, which can occur as a result of the reaction to the presence of infection in the mouth, considering the tooth pain you had about 2 weeks ago. We will be prescribing a course of antibiotics for you. The medications to be taking- amoxicillin and metronidazole for one week. If you don't have relief in about a month you should come back to the clinic. Continue taking other medications as prescribed.

## 2013-04-27 ENCOUNTER — Encounter: Payer: Self-pay | Admitting: Critical Care Medicine

## 2013-04-27 ENCOUNTER — Ambulatory Visit (INDEPENDENT_AMBULATORY_CARE_PROVIDER_SITE_OTHER): Payer: No Typology Code available for payment source | Admitting: Critical Care Medicine

## 2013-04-27 VITALS — BP 160/90 | HR 81 | Temp 98.1°F | Ht 64.0 in | Wt 144.6 lb

## 2013-04-27 DIAGNOSIS — J449 Chronic obstructive pulmonary disease, unspecified: Secondary | ICD-10-CM

## 2013-04-27 DIAGNOSIS — F17200 Nicotine dependence, unspecified, uncomplicated: Secondary | ICD-10-CM

## 2013-04-27 NOTE — Assessment & Plan Note (Signed)
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 Stable at this time Continues to smoke with e cigarettes Plan The patient was counseled regarding smoking cessation between 3 and 10 minutes Continued inhaled medications as prescribed

## 2013-04-27 NOTE — Patient Instructions (Addendum)
No change in medications Try to gradually get off e cigarettes, use nicorette lozenges Return in 4 months

## 2013-04-27 NOTE — Assessment & Plan Note (Signed)
Smoking cessation counseling was issued to the patient

## 2013-04-27 NOTE — Progress Notes (Signed)
Subjective:    Patient ID: Samuel Chen, male    DOB: April 15, 1956, 57 y.o.   MRN: [id number]  HPI  11/30/2012 Pt notes two weeks of cough and more dyspnea.  Notes some wheezing.  Mucus is brown green.  Still smoking 1/2- 3/4 PPD.  Not able to quit smoking No chest pain .  No fever chills  Sweats.  Notes some nasal pressure, notes some pndrip   04/27/2013 Chief Complaint  Patient presents with  . 4 month follow up    Breathing has improved.  No wheezing, chest tightness, chest pain, or cough at this time.  The patient continues to use electronic cigarettes but is no longer smoking regular tobacco. The patient's dyspnea is at baseline at this time. There is no excess cough or mucus production at this time.  Pt denies any significant sore throat, nasal congestion or excess secretions, fever, chills, sweats, unintended weight loss, pleurtic or exertional chest pain, orthopnea PND, or leg swelling Pt denies any increase in rescue therapy over baseline, denies waking up needing it or having any early am or nocturnal exacerbations of coughing/wheezing/or dyspnea. Pt also denies any obvious fluctuation in symptoms with  weather or environmental change or other alleviating or aggravating factors    Review of Systems  11 point review of system reviewed and is negative except as per history present illness    Objective:   Physical Exam  Filed Vitals:   04/27/13 0852  BP: 160/90  Pulse: 81  Temp: 98.1 F (36.7 C)  TempSrc: Oral  Height: 5\' 4"  (1.626 m)  Weight: 144 lb 9.6 oz (65.59 kg)  SpO2: 97%    Gen: Pleasant, well-nourished, in no distress,  normal affect  ENT: No lesions,  mouth clear,  oropharynx clear,++ postnasal , bilateral nasal purulence  Neck: No JVD, no TMG, no carotid bruits  Lungs: No use of accessory muscles, no dullness to percussion, expired wheezes and scattered rhonchi  Cardiovascular: RRR, heart sounds normal, no murmur or gallops, no peripheral  edema  Abdomen: soft and NT, no HSM,  BS normal  Musculoskeletal: No deformities, no cyanosis or clubbing  Neuro: alert, non focal  Skin: Warm, no lesions or rashes  No results found.     Assessment & Plan:   COPD (chronic obstructive pulmonary disease)Gold B COPD with asthmatic bronchitis smoking induced gold stage B. Stable at this time Continues to smoke with e cigarettes Plan The patient was counseled regarding smoking cessation between 3 and 10 minutes Continued inhaled medications as prescribed  TOBACCO ABUSE Smoking cessation counseling was issued to the patient    Updated Medication List Outpatient Encounter Prescriptions as of 04/27/2013  Medication Sig Dispense Refill  . albuterol (PROVENTIL HFA;VENTOLIN HFA) 108 (90 BASE) MCG/ACT inhaler Inhale 2 puffs into the lungs every 6 (six) hours as needed for wheezing.  1 Inhaler  6  . clobetasol ointment (TEMOVATE) 0.05 % Apply topically 2 (two) times daily as needed. Apply to the itchy areas on legs twice a day for seven days  60 g  3  . cyclobenzaprine (FLEXERIL) 10 MG tablet Take 1 tablet (10 mg total) by mouth 2 (two) times daily as needed for muscle spasms.  20 tablet  0  . esomeprazole (NEXIUM) 20 MG capsule Take 1 capsule (20 mg total) by mouth daily.  90 capsule  3  . Fluticasone-Salmeterol (ADVAIR) 250-50 MCG/DOSE AEPB Inhale 1 puff into the lungs 2 (two) times daily.  1 each  11  .  losartan-hydrochlorothiazide (HYZAAR) 100-25 MG per tablet Take 1 tablet by mouth daily.  30 tablet  6  . meloxicam (MOBIC) 15 MG tablet Take 1 tablet (15 mg total) by mouth daily.  30 tablet  4  . mometasone (NASONEX) 50 MCG/ACT nasal spray Place 2 sprays into the nose daily. One in each nostril  17 g  5  . pravastatin (PRAVACHOL) 40 MG tablet Take 1 tablet (40 mg total) by mouth daily.  30 tablet  6  . sildenafil (VIAGRA) 100 MG tablet Take 1 tablet (100 mg total) by mouth as needed for erectile dysfunction.  30 tablet  5  . traMADol  (ULTRAM) 50 MG tablet Take 1 tablet (50 mg total) by mouth every 8 (eight) hours as needed for pain.  40 tablet  0  . [DISCONTINUED] nicotine (NICOTROL) 10 MG inhaler 80 puffs per cartridge 8 cartridges per day  168 cartridge  3   Facility-Administered Encounter Medications as of 04/27/2013  Medication Dose Route Frequency Provider Last Rate Last Dose  . methylPREDNISolone acetate (DEPO-MEDROL) injection 40 mg  40 mg Intra-Lesional Once Sherrell Puller, MD

## 2013-04-28 ENCOUNTER — Encounter: Payer: Self-pay | Admitting: Critical Care Medicine

## 2013-05-27 ENCOUNTER — Other Ambulatory Visit: Payer: Self-pay | Admitting: *Deleted

## 2013-05-27 DIAGNOSIS — E785 Hyperlipidemia, unspecified: Secondary | ICD-10-CM

## 2013-05-28 MED ORDER — PRAVASTATIN SODIUM 40 MG PO TABS: 40.0000 mg | ORAL_TABLET | Freq: Every day | ORAL | Status: DC

## 2013-05-28 NOTE — Telephone Encounter (Signed)
[facility] informed of Pravastatin refill.

## 2013-06-02 ENCOUNTER — Other Ambulatory Visit: Payer: Self-pay | Admitting: Internal Medicine

## 2013-06-09 ENCOUNTER — Ambulatory Visit: Payer: Self-pay

## 2013-06-29 ENCOUNTER — Encounter: Payer: Self-pay | Admitting: Sports Medicine

## 2013-06-29 ENCOUNTER — Ambulatory Visit (INDEPENDENT_AMBULATORY_CARE_PROVIDER_SITE_OTHER): Payer: No Typology Code available for payment source | Admitting: Sports Medicine

## 2013-06-29 VITALS — BP 169/102 | HR 94 | Ht 64.0 in | Wt 144.0 lb

## 2013-06-29 DIAGNOSIS — M75102 Unspecified rotator cuff tear or rupture of left shoulder, not specified as traumatic: Secondary | ICD-10-CM

## 2013-06-29 DIAGNOSIS — M67919 Unspecified disorder of synovium and tendon, unspecified shoulder: Secondary | ICD-10-CM

## 2013-06-29 DIAGNOSIS — M25512 Pain in left shoulder: Secondary | ICD-10-CM

## 2013-06-29 DIAGNOSIS — M25519 Pain in unspecified shoulder: Secondary | ICD-10-CM

## 2013-06-29 MED ORDER — METHYLPREDNISOLONE ACETATE 40 MG/ML IJ SUSP
40.0000 mg | Freq: Once | INTRAMUSCULAR | Status: AC
  Administered 2013-06-29: 40 mg via INTRA_ARTICULAR

## 2013-06-29 NOTE — Progress Notes (Signed)
  Subjective:    Patient ID: Samuel Chen, male    DOB: 04/15/1956, 57 y.o.   MRN: [id number]  HPI Comments: 57 year old male with a history of left rotator cuff syndrome as well as osteoarthritis. Samuel Chen presents today with worsening of his left shoulder pain. In the past he has had corticosteroid injections which has shown good improvement in his left shoulder pain. Steroid shots usually last for approximately 6-8 months before he needs repeat injections. He has not been doing any over the counter medicines over the last week to treat his shoulder pain. He notes it is worse in the evenings, when he is laying on his shoulder, as well as with repetitive use. He notes no weakness, numbness, tingling in his left upper extremity. He also notes no new history of trauma.     Review of Systems  Musculoskeletal: Positive for arthralgias.  All other systems reviewed and are negative.       Objective:   Physical Exam  Nursing note and vitals reviewed. Constitutional: He appears well-developed and well-nourished. No distress.  Musculoskeletal:  Left shoulder: Normal inspection and alignment. No joint effusion or ecchymoses. Full range of motion without significant pain. Mild crepitus noted to range of motion. No a.c. joint tenderness. 5 out of 5 strength in the left upper extremity. Anterior shoulder discomfort with resisted external rotation as well as abduction. Neurovascular intact.  Skin: He is not diaphoretic.    Procedure: Left shoulder corticosteroid injection cleansed with alcohol.  Topical analgesic spray: Ethyl chloride.  Joint: subacromial  Approached in typical fashion from the posterior aspect  Completed without difficulty  Meds: 40mg  depo medrol; 3 ml of lidocaine without epinephrine  Needle:25 g  Aftercare instructions and Red flags advised.      Assessment & Plan:   57 year old male with left shoulder rotator cuff syndrome as well as osteoarthritis.  #1  osteoarthritis and rotator cuff syndrome of left shoulder. -Corticosteroid injection performed as documented above. Continued supportive therapy as previous. Return to care when necessary.  Felecia Jan, MD PGY-2

## 2013-07-02 ENCOUNTER — Encounter: Payer: Self-pay | Admitting: Internal Medicine

## 2013-07-02 ENCOUNTER — Telehealth: Payer: Self-pay | Admitting: *Deleted

## 2013-07-02 NOTE — Telephone Encounter (Signed)
Pt called to report BP reading today of 205/98  and after recheck 187/93 Pt saw Dr Margaretha Sheffield on Tuesday BP  191/91  Per patient.  Pt states he had a hip replacement in July at [facility] and was taken off Hyzaar 100/25 mg  And he never restarted until 2 days ago. Pt feels good but concerned with high BP reading.  Pt # - [id number]

## 2013-07-02 NOTE — Telephone Encounter (Signed)
I agree that he needs to continue to take his blood pressure medication, especially since he has been off of it since July. Looking at Dr. Charlsie Merles note from 10/28, his BP was 169/102.   He needs to be seen in the clinic early next week for a blood pressure check.   Please instruct him to go to the ED if he becomes dizzy, lightheaded, has any changes to his vision, or begins to experience headaches.

## 2013-07-02 NOTE — Telephone Encounter (Signed)
Talked with pt and scheduled an appointment for next Thursday. He did mention that when he used to take the Hyzaar he would have a lot of muscle cramps, they went away when not taking med and have returned the past 2 days.   Pt # [id number]

## 2013-07-02 NOTE — Telephone Encounter (Signed)
Attempted to call the pt back, but no answer and no machine to leave message for the pt to call back. He has only been taking Losartan-HCTZ for 2 days, so I don't think that this is the cause of the cramps. If they cramps persist, we can change his meds.

## 2013-07-02 NOTE — Progress Notes (Signed)
This encounter was created in error - please disregard.

## 2013-07-04 ENCOUNTER — Other Ambulatory Visit: Payer: Self-pay | Admitting: Internal Medicine

## 2013-07-08 ENCOUNTER — Encounter: Payer: Self-pay | Admitting: Internal Medicine

## 2013-07-08 ENCOUNTER — Ambulatory Visit (INDEPENDENT_AMBULATORY_CARE_PROVIDER_SITE_OTHER): Payer: No Typology Code available for payment source | Admitting: Internal Medicine

## 2013-07-08 VITALS — BP 164/83 | HR 86 | Temp 96.9°F | Ht 64.0 in | Wt 146.9 lb

## 2013-07-08 DIAGNOSIS — F17200 Nicotine dependence, unspecified, uncomplicated: Secondary | ICD-10-CM

## 2013-07-08 DIAGNOSIS — I10 Essential (primary) hypertension: Secondary | ICD-10-CM

## 2013-07-08 DIAGNOSIS — Z23 Encounter for immunization: Secondary | ICD-10-CM

## 2013-07-08 DIAGNOSIS — J309 Allergic rhinitis, unspecified: Secondary | ICD-10-CM

## 2013-07-08 DIAGNOSIS — E785 Hyperlipidemia, unspecified: Secondary | ICD-10-CM

## 2013-07-08 DIAGNOSIS — J019 Acute sinusitis, unspecified: Secondary | ICD-10-CM

## 2013-07-08 DIAGNOSIS — F1010 Alcohol abuse, uncomplicated: Secondary | ICD-10-CM

## 2013-07-08 LAB — BASIC METABOLIC PANEL WITH GFR
BUN: 13 mg/dL (ref 6–23)
CO2: 28 mEq/L (ref 19–32)
Chloride: 98 mEq/L (ref 96–112)
Creat: 0.95 mg/dL (ref 0.50–1.35)

## 2013-07-08 MED ORDER — LORATADINE 10 MG PO TABS: 10.0000 mg | ORAL_TABLET | Freq: Every day | ORAL | Status: DC

## 2013-07-08 MED ORDER — AMLODIPINE BESYLATE 10 MG PO TABS: 10.0000 mg | ORAL_TABLET | Freq: Every day | ORAL | Status: DC

## 2013-07-08 MED ORDER — LISINOPRIL 40 MG PO TABS: 40.0000 mg | ORAL_TABLET | Freq: Every day | ORAL | Status: DC

## 2013-07-08 MED ORDER — MOMETASONE FUROATE 50 MCG/ACT NA SUSP: 2.0000 | Freq: Every day | NASAL | Status: DC

## 2013-07-08 MED ORDER — ALBUTEROL SULFATE HFA 108 (90 BASE) MCG/ACT IN AERS: 2.0000 | INHALATION_SPRAY | Freq: Four times a day (QID) | RESPIRATORY_TRACT | Status: DC | PRN

## 2013-07-08 NOTE — Assessment & Plan Note (Signed)
BP Readings from Last 3 Encounters:  07/08/13 164/83  06/29/13 169/102  04/27/13 160/90    Lab Results  Component Value Date   NA 136 12/24/2012   K 4.1 12/24/2012   CREATININE 1.01 12/24/2012    Assessment: Blood pressure control: moderately elevated Progress toward BP goal:  deteriorated Comments:   Plan: Medications:  Stop Hyzaar. Start lisinopril 40 mg daily and amlodipine 10 mg daily Educational resources provided:   Self management tools provided: instructions for home blood pressure monitoring Other plans: Patient will return in 2-3 weeks for BP recheck. BMP at today's visit.

## 2013-07-08 NOTE — Assessment & Plan Note (Signed)
Patient states history drinking a little bit less however was unable to quantify. I encouraged him and advised that he continue to drink less alcohol.

## 2013-07-08 NOTE — Assessment & Plan Note (Signed)
  Assessment: Progress toward smoking cessation:    Barriers to progress toward smoking cessation:    Comments: Patient has quit and is using e-cigarettes  Plan: Instruction/counseling given:  I commended patient for quitting and reviewed strategies for preventing relapses. Educational resources provided:    Self management tools provided:    Medications to assist with smoking cessation:  Electronic cigarettes Patient agreed to the following self-care plans for smoking cessation:    Other plans:

## 2013-07-08 NOTE — Progress Notes (Signed)
Case discussed with Dr. Kollar at the time of the visit.  We reviewed the resident's history and exam and pertinent patient test results.  I agree with the assessment, diagnosis, and plan of care documented in the resident's note.     

## 2013-07-08 NOTE — Patient Instructions (Signed)
We will stop your blood pressure medicine and start you on two new medicines. The first one is called amlodipine. Take 1 pill a day. The other medicine is called lisinopril. Take 1 pill per day.  We will see you back in 2-3 weeks to check on your blood pressure and may need to add another blood pressure pill on at that time.   We have given you an allergy medicine that you can get at [facility] to help with your drainage called loratadine and you can take 1 pill per day.   Call us back with questions or problems at [phone].

## 2013-07-08 NOTE — Assessment & Plan Note (Signed)
Patient is not having acute sinusitis at today's visit. A refill for Nasonex provided as well as loratadine.

## 2013-07-08 NOTE — Progress Notes (Signed)
Subjective:     Patient ID: Samuel Chen, male   DOB: 11/17/1955, 57 y.o.   MRN: [id number]  HPI The patient is a 57 year-old male who comes in today for a blood pressure check. He was at a previous visit at sports medicine and noted that his blood pressure was elevated. He had been off his blood pressure medication for multiple months due to hip surgery. He states the surgeon told him to stay off his blood pressure medications and was never told to restart them. He has not been seen in our clinic for several months. He is not having any symptoms including headache, numbness, tingling, weakness. Otherwise he's been feeling well with the exception of this shoulder discomfort. He feels that he is having a little bit increased drainage from his allergies although he is out of his Nasonex. He would also like medication refills at today's visit. He states that he's been back on his medication for one week and has had resumption of his cramps which occur randomly throughout the day and night. He states that he only gets these when he is on his blood pressure medication. He states that he has always had problems and has told multiple providers and they were unwilling to change his blood pressure medications. He also states that he gets erectile dysfunction from his medications that has encouraged him strongly not to take his medications. He would like to avoid problems with erectile dysfunction with his future blood pressure medications. He has had no problems with getting erections while he has been off of his blood pressure medications. He denies chest pain, shortness of breath, nausea, vomiting, diarrhea.  Review of Systems  Constitutional: Negative for fever, chills, diaphoresis, activity change, appetite change, fatigue and unexpected weight change.  HENT: Positive for congestion, rhinorrhea and sinus pressure. Negative for dental problem, drooling, ear discharge, ear pain, facial swelling, hearing loss,  mouth sores, nosebleeds, postnasal drip, sneezing, sore throat, tinnitus, trouble swallowing and voice change.   Respiratory: Negative for cough, choking, chest tightness, shortness of breath and wheezing.   Cardiovascular: Negative.  Negative for chest pain, palpitations and leg swelling.  Genitourinary:       Erectile dysfunction while on BP meds  Neurological: Negative for dizziness, tremors, seizures, syncope, facial asymmetry, speech difficulty, weakness, light-headedness, numbness and headaches.       Objective:   Physical Exam  Constitutional: He is oriented to person, place, and time. He appears well-developed and well-nourished. No distress.  HENT:  Head: Normocephalic and atraumatic.  Right Ear: External ear normal.  Left Ear: External ear normal.  Nose: Nose normal.  Mouth/Throat: Oropharynx is clear and moist. No oropharyngeal exudate.  Eyes: EOM are normal. Pupils are equal, round, and reactive to light.  Neck: Normal range of motion. Neck supple. No JVD present. No tracheal deviation present. No thyromegaly present.  Cardiovascular: Normal rate and regular rhythm.   No murmur heard. Pulmonary/Chest: Effort normal and breath sounds normal. No respiratory distress. He has no wheezes. He has no rales.  Abdominal: Soft. Bowel sounds are normal. He exhibits no distension. There is no tenderness. There is no rebound.  Musculoskeletal: Normal range of motion. He exhibits no edema.  Neurological: He is alert and oriented to person, place, and time. No cranial nerve deficit.  Skin: Skin is warm and dry. He is not diaphoretic. No erythema.       Assessment/Plan:   1. Please see problem oriented charting.  2. Disposition-patient will be seen  back in 2-3 weeks for blood pressure recheck. He was given flu shot at today's visit, BMP checked at today's visit. We did stop his current blood pressure medication given his reluctance to take it do to the erectile dysfunction. We will  start amlodipine 10 mg daily as well as lisinopril 40 mg daily. Suspect he will need additional agent however seemed prudent to not start too many new medications at one visit. Informed him that if we are starting additional agents they may be multiple times per day medications as he is unwilling to restart HCTZ as he feels this is the cause of his erectile dysfunction and cramps.

## 2013-07-22 ENCOUNTER — Encounter: Payer: Self-pay | Admitting: Internal Medicine

## 2013-07-22 ENCOUNTER — Ambulatory Visit (INDEPENDENT_AMBULATORY_CARE_PROVIDER_SITE_OTHER): Payer: No Typology Code available for payment source | Admitting: Internal Medicine

## 2013-07-22 VITALS — BP 125/75 | HR 87 | Temp 98.0°F | Ht 63.5 in | Wt 151.5 lb

## 2013-07-22 DIAGNOSIS — F17200 Nicotine dependence, unspecified, uncomplicated: Secondary | ICD-10-CM

## 2013-07-22 DIAGNOSIS — F528 Other sexual dysfunction not due to a substance or known physiological condition: Secondary | ICD-10-CM

## 2013-07-22 DIAGNOSIS — R739 Hyperglycemia, unspecified: Secondary | ICD-10-CM | POA: Insufficient documentation

## 2013-07-22 DIAGNOSIS — R252 Cramp and spasm: Secondary | ICD-10-CM

## 2013-07-22 DIAGNOSIS — I10 Essential (primary) hypertension: Secondary | ICD-10-CM

## 2013-07-22 DIAGNOSIS — F1010 Alcohol abuse, uncomplicated: Secondary | ICD-10-CM

## 2013-07-22 DIAGNOSIS — R7309 Other abnormal glucose: Secondary | ICD-10-CM

## 2013-07-22 LAB — GLUCOSE, CAPILLARY: Glucose-Capillary: 103 mg/dL — ABNORMAL HIGH (ref 70–99)

## 2013-07-22 NOTE — Assessment & Plan Note (Signed)
BP Readings from Last 3 Encounters:  07/22/13 125/75  07/08/13 164/83  06/29/13 169/102    Lab Results  Component Value Date   NA 136 07/08/2013   K 3.8 07/08/2013   CREATININE 0.95 07/08/2013    Assessment: Blood pressure control: controlled Progress toward BP goal:  at goal Comments: He was started on lisinopril 40 mg daily and his amlodipine was increased to 10 mg daily as last clinic visit  Plan: Medications:  continue current medications Educational resources provided: brochure;handout;video Self management tools provided:   Other plans: Follow up in 6 months

## 2013-07-22 NOTE — Progress Notes (Signed)
Patient ID: Samuel Chen, male   DOB: 01-03-1956, 57 y.o.   MRN: [id number]  Subjective:   Patient ID: Samuel Chen male   DOB: 10-10-1955 57 y.o.   MRN: [id number]  HPI: [name] is a 57 y.o. M w/ PMH HTN, HLD, tobacco abuse, and EtOH abuse presents to the clinic for a blood pressure check.   He had hip surgery a few months ago and was told to stop his BP meds at that time but did not restart them. He was seen by Sports medicine on 10/28 and his BP was elevated. He was asked to resume his BP meds and subsequently developed muscle cramps and erectile dysfunction, which he attributed to the medicaitons. He was seen on 11/6 and was started on lisinopril 40mg  and told to continue the amlodipine 10mg . Since that time he denies any dizziness and states that his leg cramps have resolved as has his erectile dysfunction  She states that he has not smoked since July and is using e-cigarettes. He states that his breathing is doing well, and denies any shortness of breath.  He states that he is drinking 3-5 beers a night, but not every night. He states that he feels this is a normal amount. He is not interested in quitting at this time.   Past Medical History  Diagnosis Date  . GERD (gastroesophageal reflux disease)   . Hypertension   . Hyperlipidemia   . COPD (chronic obstructive pulmonary disease)     Questionable diagnosis.  . 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 Myalgia 11/2007    Likely 2/2 Pravachol  . Dyshidrotic eczema   . Tobacco abuse   . Allergic rhinitis    Current Outpatient Prescriptions  Medication Sig Dispense Refill  . albuterol (PROVENTIL HFA;VENTOLIN HFA) 108 (90 BASE) MCG/ACT inhaler Inhale 2 puffs into the lungs every 6 (six) hours as needed for wheezing.  1 Inhaler  6  . amLODipine (NORVASC) 10 MG tablet Take 1 tablet (10 mg total) by mouth daily.  30 tablet  3  . clobetasol ointment (TEMOVATE) 0.05 % Apply topically 2 (two) times daily as  needed. Apply to the itchy areas on legs twice a day for seven days  60 g  3  . esomeprazole (NEXIUM) 20 MG capsule Take 1 capsule (20 mg total) by mouth daily.  90 capsule  3  . Fluticasone-Salmeterol (ADVAIR) 250-50 MCG/DOSE AEPB Inhale 1 puff into the lungs 2 (two) times daily.  1 each  11  . lisinopril (PRINIVIL,ZESTRIL) 40 MG tablet Take 1 tablet (40 mg total) by mouth daily.  30 tablet  3  . loratadine (CLARITIN) 10 MG tablet Take 1 tablet (10 mg total) by mouth daily.  30 tablet  2  . losartan-hydrochlorothiazide (HYZAAR) 100-25 MG per tablet Take 1 tablet by mouth daily.  30 tablet  6  . meloxicam (MOBIC) 15 MG tablet TAKE ONE TABLET BY MOUTH ONCE DAILY  30 tablet  0  . mometasone (NASONEX) 50 MCG/ACT nasal spray Place 2 sprays into the nose daily. One in each nostril  17 g  5  . pravastatin (PRAVACHOL) 40 MG tablet Take 1 tablet (40 mg total) by mouth daily.  30 tablet  6  . sildenafil (VIAGRA) 100 MG tablet Take 1 tablet (100 mg total) by mouth as needed for erectile dysfunction.  30 tablet  5   No current facility-administered medications for this visit.   Family History  Problem Relation Age of Onset  . Heart disease Sister   . Heart attack Sister     34-35yo  . Heart disease Father    History   Social History  . Marital Status: Single    Spouse Name: N/A    Number of Children: N/A  . Years of Education: N/A   Social History Main Topics  . Smoking status: Former Smoker -- 1.50 packs/day for 40 years    Types: Cigarettes    Start date: 06/03/1983    Quit date: 03/04/2013  . Smokeless tobacco: Never Used     Comment: Started smoking at age 24. Currently smoking 3/4 ppd. PATIENT IS NOW USING E CIGARETTES  . Alcohol Use: Yes     Comment: 6 to 7 beers daily x 25 years at least   . Drug Use: No  . Sexual Activity: Yes   Other Topics Concern  . None   Social History Narrative   Financial assistance approved for 100% discount at [facility] and has [facility] card per [employer]  October 13,2011 4:21PM   Review of Systems: A 12 point ROS was performed; pertinent positives and negatives were noted in the HPI   Objective:  Physical Exam: Filed Vitals:   07/22/13 1353  BP: 125/75  Pulse: 87  Temp: 98 F (36.7 C)  TempSrc: Oral  Height: 5' 3.5" (1.613 m)  Weight: 151 lb 8 oz (68.72 kg)  SpO2: 99%   Constitutional: Vital signs reviewed.  Patient is a well-developed and well-nourished male in no acute distress and cooperative with exam.  Head: Normocephalic and atraumatic Eyes: PERRL, EOMI, conjunctivae normal, No scleral icterus.  Neck: Supple, Trachea midline normal ROM, no mass or thyromegaly Cardiovascular: RRR, no MRG, pulses symmetric and intact bilaterally Pulmonary/Chest: normal respiratory effort, CTAB, no wheezes, rales, or rhonchi Abdominal: Soft. Non-tender, non-distended, bowel sounds are normal, no masses, organomegaly, or guarding present.  Musculoskeletal: No joint deformities, erythema, or stiffness Hematology: no cervical adenopathy.  Neurological: A&O x3, non-focal. CN 2-12 intact Skin: Warm, dry and intact. Trace edema in BLE at the shins  Psychiatric: Normal mood and affect.  Assessment & Plan:   Please refer to Problem List based Assessment and Plan

## 2013-07-22 NOTE — Assessment & Plan Note (Signed)
Pt c/o ED while on thiazide diuretic. Since stopping the medication earlier this month, he denies any further erectile dysfunction.

## 2013-07-22 NOTE — Assessment & Plan Note (Signed)
  Assessment: Progress toward smoking cessation:    Barriers to progress toward smoking cessation:    Comments: Patient states he's been using E-cigarettes since 03/04/2013. He states that his breathing is doing well. He's using his Advair twice a day, and rarely is having to use his albuterol inhaler.  Plan: Instruction/counseling given:  I commended patient for quitting and reviewed strategies for preventing relapses. Medications to assist with smoking cessation:  Electronic cigarettes

## 2013-07-22 NOTE — Assessment & Plan Note (Signed)
Pt states that he is drinking "the normal amount of ETOH." When asked to quantify, he states that he is drinking b/w 3-5 beers every couple of nights, but not every night. He does not desire cessation at this time.

## 2013-07-22 NOTE — Assessment & Plan Note (Signed)
Patient's blood sugars been above 100 for the past few visits. Today is 123. Check an A1c which is 6.0. Discussed diet and exercise with patient, recommending to be more conscious of what he is eating, and trying to avoid sweets and complex carbohydrates, including white breads, pastas, and rice. He acknowledged understanding and said that he would monitor what he eats more closely.

## 2013-07-23 LAB — BASIC METABOLIC PANEL WITH GFR
CO2: 25 mEq/L (ref 19–32)
Calcium: 9.1 mg/dL (ref 8.4–10.5)
Chloride: 105 mEq/L (ref 96–112)
GFR, Est African American: >89 mL/min
Glucose, Bld: 104 mg/dL — ABNORMAL HIGH (ref 70–99)
Sodium: 138 mEq/L (ref 135–145)

## 2013-07-26 ENCOUNTER — Ambulatory Visit: Payer: No Typology Code available for payment source

## 2013-07-26 NOTE — Progress Notes (Signed)
Case discussed with Dr. Glenn soon after the resident saw the patient.  We reviewed the resident's history and exam and pertinent patient test results.  I agree with the assessment, diagnosis, and plan of care documented in the resident's note. 

## 2013-08-02 ENCOUNTER — Other Ambulatory Visit: Payer: Self-pay | Admitting: Internal Medicine

## 2013-08-03 ENCOUNTER — Ambulatory Visit (INDEPENDENT_AMBULATORY_CARE_PROVIDER_SITE_OTHER): Payer: No Typology Code available for payment source | Admitting: Internal Medicine

## 2013-08-03 ENCOUNTER — Encounter: Payer: Self-pay | Admitting: Internal Medicine

## 2013-08-03 VITALS — BP 123/79 | HR 90 | Temp 98.2°F | Wt 151.1 lb

## 2013-08-03 DIAGNOSIS — R600 Localized edema: Secondary | ICD-10-CM | POA: Insufficient documentation

## 2013-08-03 DIAGNOSIS — R609 Edema, unspecified: Secondary | ICD-10-CM

## 2013-08-03 MED ORDER — FUROSEMIDE 20 MG PO TABS: 20.0000 mg | ORAL_TABLET | Freq: Every day | ORAL | Status: DC

## 2013-08-03 NOTE — Patient Instructions (Addendum)
Stop taking the amlodipine/Norvasc. This is her swelling seemed to start after he began taking this medication, I think that the swelling year having in your legs is due to a side effect from this medicine. If the swelling does not resolve over the next few weeks, please call the clinic.  I am starting another fluid pill on you today. This will make you go to the bathroom more.    Furosemide tablets What is this medicine? FUROSEMIDE (fyoor [place] se mide) is a diuretic. It helps you make more urine and to lose salt and excess water from your body. This medicine is used to treat high blood pressure, and edema or swelling from heart, kidney, or liver disease. This medicine may be used for other purposes; ask your health care provider or pharmacist if you have questions. COMMON BRAND NAME(S): Delone , Lasix What should I tell my health care provider before I take this medicine? They need to know if you have any of these conditions: -abnormal blood electrolytes -diarrhea or vomiting -gout -heart disease -kidney disease, small amounts of urine, or difficulty passing urine -liver disease -an unusual or allergic reaction to furosemide, sulfa drugs, other medicines, foods, dyes, or preservatives -pregnant or trying to get pregnant -breast-feeding How should I use this medicine? Take this medicine by mouth with a glass of water. Follow the directions on the prescription label. You may take this medicine with or without food. If it upsets your stomach, take it with food or milk. Do not take your medicine more often than directed. Remember that you will need to pass more urine after taking this medicine. Do not take your medicine at a time of day that will cause you problems. Do not take at bedtime. Talk to your pediatrician regarding the use of this medicine in children. While this drug may be prescribed for selected conditions, precautions do apply. Overdosage: If you think you have taken too much of this  medicine contact a poison control center or emergency room at once. NOTE: This medicine is only for you. Do not share this medicine with others. What if I miss a dose? If you miss a dose, take it as soon as you can. If it is almost time for your next dose, take only that dose. Do not take double or extra doses. What may interact with this medicine? -aspirin and aspirin-like medicines -certain antibiotics -chloral hydrate -cisplatin -cyclosporine -digoxin -diuretics -laxatives -lithium -medicines for blood pressure -medicines that relax muscles for surgery -methotrexate -NSAIDs, medicines for pain and inflammation like ibuprofen, naproxen, or indomethacin -phenytoin -steroid medicines like prednisone or cortisone -sucralfate This list may not describe all possible interactions. Give your health care provider a list of all the medicines, herbs, non-prescription drugs, or dietary supplements you use. Also tell them if you smoke, drink alcohol, or use illegal drugs. Some items may interact with your medicine. What should I watch for while using this medicine? Visit your doctor or health care professional for regular checks on your progress. Check your blood pressure regularly. Ask your doctor or health care professional what your blood pressure should be, and when you should contact him or her. If you are a diabetic, check your blood sugar as directed. You may need to be on a special diet while taking this medicine. Check with your doctor. Also, ask how many glasses of fluid you need to drink a day. You must not get dehydrated. You may get drowsy or dizzy. Do not drive, use machinery, or  do anything that needs mental alertness until you know how this drug affects you. Do not stand or sit up quickly, especially if you are an older patient. This reduces the risk of dizzy or fainting spells. Alcohol can make you more drowsy and dizzy. Avoid alcoholic drinks. This medicine can make you more  sensitive to the sun. Keep out of the sun. If you cannot avoid being in the sun, wear protective clothing and use sunscreen. Do not use sun lamps or tanning beds/booths. What side effects may I notice from receiving this medicine? Side effects that you should report to your doctor or health care professional as soon as possible: -blood in urine or stools -dry mouth -fever or chills -hearing loss or ringing in the ears -irregular heartbeat -muscle pain or weakness, cramps -skin rash -stomach upset, pain, or nausea -tingling or numbness in the hands or feet -unusually weak or tired -vomiting or diarrhea -yellowing of the eyes or skin Side effects that usually do not require medical attention (report to your doctor or health care professional if they continue or are bothersome): -headache -loss of appetite -unusual bleeding or bruising This list may not describe all possible side effects. Call your doctor for medical advice about side effects. You may report side effects to FDA at 1-800-FDA-1088. Where should I keep my medicine? Keep out of the reach of children. Store at room temperature between 15 and 30 degrees C (59 and 86 degrees F). Protect from light. Throw away any unused medicine after the expiration date. NOTE: This sheet is a summary. It may not cover all possible information. If you have questions about this medicine, talk to your doctor, pharmacist, or health care provider.  2014, [employer]vier/Gold Standard. (2009-08-07 16:24:50)

## 2013-08-03 NOTE — Assessment & Plan Note (Addendum)
Patient with bilateral lower extremity edema that has increased over the past for 5 days. He was noted to have trace pitting edema of BLE at his last clinic visit on 11/20, which is not documented in his clinic visit earlier in November, when the amlodipine was started, so I think this is a reaction to the amlodipine. He has previously been on atenolol, losartan-HCTZ, and lisinopril-HCTZ. He states that on the atenolol and medications with HCTZ he was having erectile dysfunction, which resolved after stopping the medications.   I am stopping the amlodipine today, and started him on Lasix 20 mg daily. Progress check a BMP on him in 2 weeks, and he is to followup in the clinic in one month for blood pressure recheck, and see how his edema is doing.

## 2013-08-03 NOTE — Progress Notes (Signed)
Patient ID: Samuel Chen, male   DOB: 1955-09-18, 57 y.o.   MRN: [id number]  Subjective:   Patient ID: Samuel Chen male   DOB: [birth year] 57 y.o.   MRN: [id number]  HPI: Samuel Chen is a 57 y.o. M w/ PMH HTN, HLD, tobacco abuse, and EtOH abuse presents to the clinic c/o right ankle swelling.  He states that his right ankle has been swelling for the past 4-5 days. He states that this all worsened after his Thanksgiving meal. He was seen by me in the clinic on 11/20 and at that time he did have trace BLE pitting edema at the shins. He was recently started on Amlodipine at the beginning of November, and at that time he had no documented peripheral edema. I did see him in May and at that time he had no peripheral edema.  He did have an TTE 10/2008 to assess LV function 2/2 to his HTN and mild HLD, which was normal with an EF of 60%.   Past Medical History  Diagnosis Date  . GERD (gastroesophageal reflux disease)   . Hypertension   . Hyperlipidemia   . COPD (chronic obstructive pulmonary disease)     Questionable diagnosis.  . 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 Myalgia 11/2007    Likely 2/2 Pravachol  . Dyshidrotic eczema   . Tobacco abuse   . Allergic rhinitis    Current Outpatient Prescriptions  Medication Sig Dispense Refill  . albuterol (PROVENTIL HFA;VENTOLIN HFA) 108 (90 BASE) MCG/ACT inhaler Inhale 2 puffs into the lungs every 6 (six) hours as needed for wheezing.  1 Inhaler  6  . amLODipine (NORVASC) 10 MG tablet Take 1 tablet (10 mg total) by mouth daily.  30 tablet  3  . clobetasol ointment (TEMOVATE) 0.05 % Apply topically 2 (two) times daily as needed. Apply to the itchy areas on legs twice a day for seven days  60 g  3  . esomeprazole (NEXIUM) 20 MG capsule Take 1 capsule (20 mg total) by mouth daily.  90 capsule  3  . Fluticasone-Salmeterol (ADVAIR) 250-50 MCG/DOSE AEPB Inhale 1 puff into the lungs 2 (two) times daily.  1 each  11  .  lisinopril (PRINIVIL,ZESTRIL) 40 MG tablet Take 1 tablet (40 mg total) by mouth daily.  30 tablet  3  . mometasone (NASONEX) 50 MCG/ACT nasal spray Place 2 sprays into the nose daily. One in each nostril  17 g  5  . pravastatin (PRAVACHOL) 40 MG tablet Take 1 tablet (40 mg total) by mouth daily.  30 tablet  6  . furosemide (LASIX) 20 MG tablet Take 1 tablet (20 mg total) by mouth daily.  30 tablet  11  . loratadine (CLARITIN) 10 MG tablet Take 1 tablet (10 mg total) by mouth daily.  30 tablet  2  . meloxicam (MOBIC) 15 MG tablet TAKE ONE TABLET BY MOUTH ONCE DAILY  30 tablet  0  . sildenafil (VIAGRA) 100 MG tablet Take 1 tablet (100 mg total) by mouth as needed for erectile dysfunction.  30 tablet  5   No current facility-administered medications for this visit.   Family History  Problem Relation Age of Onset  . Heart disease Sister   . Heart attack Sister     34-35yo  . Heart disease Father    History   Social History  . Marital Status: Single    Spouse Name: N/A    Number  of Children: N/A  . Years of Education: N/A   Social History Main Topics  . Smoking status: Former Smoker -- 1.50 packs/day for 40 years    Types: Cigarettes    Start date: 06/03/1983    Quit date: 03/04/2013  . Smokeless tobacco: Never Used     Comment: Started smoking at age 51. Currently smoking 3/4 ppd. PATIENT IS NOW USING E CIGARETTES  . Alcohol Use: Yes     Comment: 6 to 7 beers daily x 25 years at least   . Drug Use: No  . Sexual Activity: Yes   Other Topics Concern  . None   Social History Narrative   Financial assistance approved for 100% discount at [facility] and has [facility] card per Rudell Cobb October 13,2011 4:21PM   Review of Systems: Constitutional: Denies fatigue.  Respiratory: Denies SOB, DOE, cough Cardiovascular: Denies chest pain. + leg swelling.  Gastrointestinal: Denies nausea, vomiting, abdominal pain Genitourinary: States hat his ED resolved after stopping the  HCTZ Musculoskeletal: Denies myalgias Neurological: Denies dizziness, light-headedness  Psychiatric/Behavioral: Denies mood changes  Objective:  Physical Exam: Filed Vitals:   08/03/13 1516 08/03/13 1557  BP: 143/82 123/79  Pulse: 97 90  Temp: 98.2 F (36.8 C)   TempSrc: Oral   Weight: 151 lb 1.6 oz (68.539 kg)    Constitutional: Vital signs reviewed.  Patient is a well-developed and well-nourished male in no acute distress and cooperative with exam.  Head: Normocephalic and atraumatic Eyes: PERRL, EOMI, conjunctivae normal, No scleral icterus.  Cardiovascular: RRR, no MRG, pulses symmetric and intact bilaterally Pulmonary/Chest: normal respiratory effort, CTAB, no wheezes, rales, or rhonchi Abdominal: Soft. Non-tender, non-distended, bowel sounds are normal, no masses, organomegaly, or guarding present.  Musculoskeletal: Moves all 4 extremities Neurological: A&O x3, Strength is normal and symmetric bilaterally, cranial nerve II-XII are grossly intact, no focal motor deficit, sensory intact to light touch bilaterally.  Skin: Warm, dry. Excoriations on BLE at the shins. 1-2+ pitting edema of BLE from the ankle to the knee Psychiatric: Normal mood and affect. speech and behavior is normal.   Assessment & Plan:   Please refer to Problem List based Assessment and Plan

## 2013-08-04 NOTE — Progress Notes (Signed)
Case discussed with Dr. Glenn soon after the resident saw the patient.  We reviewed the resident's history and exam and pertinent patient test results.  I agree with the assessment, diagnosis, and plan of care documented in the resident's note. 

## 2013-08-11 ENCOUNTER — Other Ambulatory Visit: Payer: Self-pay | Admitting: *Deleted

## 2013-08-11 ENCOUNTER — Telehealth: Payer: Self-pay | Admitting: *Deleted

## 2013-08-11 MED ORDER — ALBUTEROL SULFATE HFA 108 (90 BASE) MCG/ACT IN AERS: 2.0000 | INHALATION_SPRAY | Freq: Four times a day (QID) | RESPIRATORY_TRACT | Status: DC | PRN

## 2013-08-11 NOTE — Telephone Encounter (Signed)
How is his swelling? If the swelling hs improved after stopping the amlodipine, we may be able to stop the Lasix. He needs to have lab work done and his blood pressure needs to be checked.

## 2013-08-11 NOTE — Telephone Encounter (Signed)
Pt calls and states since starting lasix on 12/3 he is having cramping, it is progressively getting worse, does he need to come in to see someone this pm? Or does he need to come in for labs only, stop lasix and make decision after results are evaluated? Please advise

## 2013-08-12 NOTE — Telephone Encounter (Signed)
Talked to pt; appt has been scheduled for tomorrow at1430pm w/ Dr Sherrine Maples.

## 2013-08-13 ENCOUNTER — Encounter: Payer: Self-pay | Admitting: Internal Medicine

## 2013-08-13 ENCOUNTER — Ambulatory Visit (INDEPENDENT_AMBULATORY_CARE_PROVIDER_SITE_OTHER): Payer: No Typology Code available for payment source | Admitting: Internal Medicine

## 2013-08-13 VITALS — BP 134/72 | HR 71 | Temp 97.3°F | Ht 64.0 in | Wt 153.5 lb

## 2013-08-13 DIAGNOSIS — R609 Edema, unspecified: Secondary | ICD-10-CM

## 2013-08-13 DIAGNOSIS — E785 Hyperlipidemia, unspecified: Secondary | ICD-10-CM

## 2013-08-13 DIAGNOSIS — I10 Essential (primary) hypertension: Secondary | ICD-10-CM

## 2013-08-13 DIAGNOSIS — R252 Cramp and spasm: Secondary | ICD-10-CM | POA: Insufficient documentation

## 2013-08-13 DIAGNOSIS — R600 Localized edema: Secondary | ICD-10-CM

## 2013-08-13 LAB — MAGNESIUM: Magnesium: 1.7 mg/dL (ref 1.5–2.5)

## 2013-08-13 LAB — LIPID PANEL
Cholesterol: 164 mg/dL (ref 0–200)
HDL: 58 mg/dL (ref >39)
Total CHOL/HDL Ratio: 2.8 Ratio
Triglycerides: 102 mg/dL (ref <150)

## 2013-08-13 LAB — BASIC METABOLIC PANEL WITH GFR
CO2: 23 mEq/L (ref 19–32)
Chloride: 103 mEq/L (ref 96–112)
Creat: 0.93 mg/dL (ref 0.50–1.35)
Potassium: 4 mEq/L (ref 3.5–5.3)

## 2013-08-13 NOTE — Assessment & Plan Note (Signed)
Is currently on pravastatin 40 mg daily. His last lipid panel was February of 2013. Rechecking lipid panel today

## 2013-08-13 NOTE — Patient Instructions (Signed)
Stop the Lasix.   Your blood pressure looks good today! Continue the Lisinopril.  I will see you back in May or sooner if needed.

## 2013-08-13 NOTE — Progress Notes (Signed)
Patient ID: Samuel Chen, male   DOB: September 18, 1955, 57 y.o.   MRN: [id number]  Subjective:   Patient ID: Samuel Chen male   DOB: 03-Jun-1956 57 y.o.   MRN: [id number]  HPI: Samuel Chen is a 57 y.o. M w/ PMH HTN, HLD, tobacco abuse, and EtOH abuse presents to the clinic c/o muscle cramping.  Pt was started on Lasix at his last visit 2/2 BLE and HTN. The edema was thought to be 2/2 to Amlodipine and dietary indiscretion over Thanksgiving. He presents today c/o BLE cramping since starting the Lasix. He stopped the Lasix on 12/9 and his cramping has resolved.   Per pt, since his last visit, he stopped using the Amlodipine and his BLE has resolved as well.  He denies any headaches, light headedness, or dizziness.    Past Medical History  Diagnosis Date  . GERD (gastroesophageal reflux disease)   . Hypertension   . Hyperlipidemia   . COPD (chronic obstructive pulmonary disease)     Questionable diagnosis.  . 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 Myalgia 11/2007    Likely 2/2 Pravachol  . Dyshidrotic eczema   . Tobacco abuse   . Allergic rhinitis    Current Outpatient Prescriptions  Medication Sig Dispense Refill  . albuterol (PROVENTIL HFA;VENTOLIN HFA) 108 (90 BASE) MCG/ACT inhaler Inhale 2 puffs into the lungs every 6 (six) hours as needed for wheezing.  1 Inhaler  6  . amLODipine (NORVASC) 10 MG tablet Take 1 tablet (10 mg total) by mouth daily.  30 tablet  3  . clobetasol ointment (TEMOVATE) 0.05 % Apply topically 2 (two) times daily as needed. Apply to the itchy areas on legs twice a day for seven days  60 g  3  . esomeprazole (NEXIUM) 20 MG capsule Take 1 capsule (20 mg total) by mouth daily.  90 capsule  3  . Fluticasone-Salmeterol (ADVAIR) 250-50 MCG/DOSE AEPB Inhale 1 puff into the lungs 2 (two) times daily.  1 each  11  . furosemide (LASIX) 20 MG tablet Take 1 tablet (20 mg total) by mouth daily.  30 tablet  11  . lisinopril (PRINIVIL,ZESTRIL) 40 MG  tablet Take 1 tablet (40 mg total) by mouth daily.  30 tablet  3  . loratadine (CLARITIN) 10 MG tablet Take 1 tablet (10 mg total) by mouth daily.  30 tablet  2  . meloxicam (MOBIC) 15 MG tablet TAKE ONE TABLET BY MOUTH ONCE DAILY  30 tablet  2  . mometasone (NASONEX) 50 MCG/ACT nasal spray Place 2 sprays into the nose daily. One in each nostril  17 g  5  . pravastatin (PRAVACHOL) 40 MG tablet Take 1 tablet (40 mg total) by mouth daily.  30 tablet  6  . sildenafil (VIAGRA) 100 MG tablet Take 1 tablet (100 mg total) by mouth as needed for erectile dysfunction.  30 tablet  5   No current facility-administered medications for this visit.   Family History  Problem Relation Age of Onset  . Heart disease Sister   . Heart attack Sister     34-55yo  . Heart disease Father    History   Social History  . Marital Status: Single    Spouse Name: N/A    Number of Children: N/A  . Years of Education: N/A   Social History Main Topics  . Smoking status: Former Smoker -- 1.50 packs/day for 40 years    Types: Cigarettes  Start date: 06/03/1983    Quit date: 03/04/2013  . Smokeless tobacco: Never Used     Comment: Started smoking at age 59. Currently smoking 3/4 ppd. PATIENT IS NOW USING E CIGARETTES  . Alcohol Use: Yes     Comment: 6 to 7 beers daily x 25 years at least   . Drug Use: No  . Sexual Activity: Yes   Other Topics Concern  . None   Social History Narrative   Financial assistance approved for 100% discount at [facility] and has [facility] card per [employer] October 13,2011 4:21PM   Review of Systems: A 12 point ROS was performed; pertinent positives and negatives were noted in the HPI   Objective:  Physical Exam: Filed Vitals:   08/13/13 1430  BP: 134/72  Pulse: 71  Temp: 97.3 F (36.3 C)  TempSrc: Oral  Height: 5\' 4"  (1.626 m)  Weight: 153 lb 8 oz (69.627 kg)  SpO2: 98%   Constitutional: Vital signs reviewed.  Patient is a well-developed and well-nourished male in no acute  distress and cooperative with exam. Alert and oriented x3.  Head: Normocephalic and atraumatic Eyes: PERRL, EOMI, conjunctivae normal, No scleral icterus.  Cardiovascular: RRR, no MRG Pulmonary/Chest: normal respiratory effort, CTAB, no wheezes, rales, or rhonchi Abdominal: Soft. Non-tender, non-distended Musculoskeletal: No joint deformities, erythema, or stiffness, ROM full and no non tender. Trace peripheral edema in LLE, no edema in his RLE Neurological: A&O x3, non focal Skin: Warm, dry and intact.  Psychiatric: Normal mood and affect. speech and behavior is normal.   Assessment & Plan:   Please refer to Problem List based Assessment and Plan

## 2013-08-13 NOTE — Assessment & Plan Note (Signed)
Since stopping the amlodipine his bilateral lower extremity edema has almost completely resolved. He was also given Lasix at the time due to concerns for fluid retention secondary to increased salt intake from Thanksgiving. He stopped that on 12/9 secondary to bilateral archery cramping which resolved soon after stopping the Lasix.

## 2013-08-13 NOTE — Assessment & Plan Note (Signed)
BP Readings from Last 3 Encounters:  08/13/13 134/72  08/03/13 123/79  07/22/13 125/75    Lab Results  Component Value Date   NA 138 07/22/2013   K 4.2 07/22/2013   CREATININE 0.93 07/22/2013    Assessment: Blood pressure control: controlled Progress toward BP goal:  at goal Comments: Currently is just taking the lisinopril 40 mg daily his blood pressure looks good today  Plan: Medications:  continue current medications Educational resources provided: brochure;handout;video Self management tools provided:   Other plans: Followup within the next 6 months

## 2013-08-13 NOTE — Assessment & Plan Note (Addendum)
Patient with history of muscle cramps from HCTZ which was stopped in the past few months. His are Lasix 20 due to concerns for fluid retention secondary to dietary indiscretion over Thanksgiving which was thought to be contributing to his bilateral lower extremity edema. However the Lasix was stopped within one week of beginning it secondary to muscle cramping. Since the Lasix he has had no further also cramps. - Checked a BMP and Mg today which were normal

## 2013-08-14 NOTE — Progress Notes (Signed)
Case discussed with Dr. Glenn at the time of the visit.  We reviewed the resident's history and exam and pertinent patient test results.  I agree with the assessment, diagnosis, and plan of care documented in the resident's note.   

## 2013-08-17 ENCOUNTER — Other Ambulatory Visit: Payer: No Typology Code available for payment source

## 2013-09-09 ENCOUNTER — Encounter: Payer: No Typology Code available for payment source | Admitting: Internal Medicine

## 2013-09-20 ENCOUNTER — Ambulatory Visit (INDEPENDENT_AMBULATORY_CARE_PROVIDER_SITE_OTHER): Payer: No Typology Code available for payment source | Admitting: Critical Care Medicine

## 2013-09-20 ENCOUNTER — Encounter: Payer: Self-pay | Admitting: Critical Care Medicine

## 2013-09-20 VITALS — BP 128/78 | HR 92 | Temp 98.1°F | Ht 64.0 in | Wt 147.8 lb

## 2013-09-20 DIAGNOSIS — J019 Acute sinusitis, unspecified: Secondary | ICD-10-CM

## 2013-09-20 DIAGNOSIS — J449 Chronic obstructive pulmonary disease, unspecified: Secondary | ICD-10-CM

## 2013-09-20 MED ORDER — AMOXICILLIN-POT CLAVULANATE 875-125 MG PO TABS: 1.0000 | ORAL_TABLET | Freq: Two times a day (BID) | ORAL | Status: DC

## 2013-09-20 NOTE — Progress Notes (Signed)
  Subjective:    Patient ID: Samuel Chen, male    DOB: Mar 28, 1956, 58 y.o.   MRN: [id number]  HPI   09/20/2013 Chief Complaint  Patient presents with  . Follow-up    c/o sore throat, increased SOB, prod cough with greenish brown mucus, wheezing, and chest tightness x 1 wk.  No f/c/s.  Notes sore throat and more dyspnea and cough green mucus. Notes some tightness. occ ache.     Review of Systems  11 point review of system reviewed and is negative except as per history present illness    Objective:   Physical Exam  Filed Vitals:   09/20/13 1646  BP: 128/78  Pulse: 92  Temp: 98.1 F (36.7 C)  TempSrc: Oral  Height: 5\' 4"  (1.626 m)  Weight: 147 lb 12.8 oz (67.042 kg)  SpO2: 99%    Gen: Pleasant, well-nourished, in no distress,  normal affect  ENT: No lesions,  mouth clear,  oropharynx clear,++ postnasal , bilateral nasal purulence  Neck: No JVD, no TMG, no carotid bruits  Lungs: No use of accessory muscles, no dullness to percussion, expired wheezes and scattered rhonchi  Cardiovascular: RRR, heart sounds normal, no murmur or gallops, no peripheral edema  Abdomen: soft and NT, no HSM,  BS normal  Musculoskeletal: No deformities, no cyanosis or clubbing  Neuro: alert, non focal  Skin: Warm, no lesions or rashes  No results found.     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 cigarettes for smoking cessation particularly since the patient has failed Chantix and nicotine replacement therapy in the past Note the patient has mild acute sinusitis and bronchitis and for this a seven-day course of Augmentin will be prescribed Maintain Advair    Updated Medication List Outpatient Encounter Prescriptions as of 09/20/2013  Medication Sig  . albuterol (PROVENTIL HFA;VENTOLIN HFA) 108 (90 BASE) MCG/ACT inhaler Inhale 2 puffs  into the lungs every 6 (six) hours as needed for wheezing.  . clobetasol ointment (TEMOVATE) 0.05 % Apply topically 2 (two) times daily as needed. Apply to the itchy areas on legs twice a day for seven days  . esomeprazole (NEXIUM) 20 MG capsule Take 1 capsule (20 mg total) by mouth daily.  . Fluticasone-Salmeterol (ADVAIR) 250-50 MCG/DOSE AEPB Inhale 1 puff into the lungs 2 (two) times daily.  Marland Kitchen. lisinopril (PRINIVIL,ZESTRIL) 40 MG tablet Take 1 tablet (40 mg total) by mouth daily.  . meloxicam (MOBIC) 15 MG tablet TAKE ONE TABLET BY MOUTH ONCE DAILY  . mometasone (NASONEX) 50 MCG/ACT nasal spray Place 2 sprays into the nose daily. One in each nostril  . pravastatin (PRAVACHOL) 40 MG tablet Take 1 tablet (40 mg total) by mouth daily.  . sildenafil (VIAGRA) 100 MG tablet Take 1 tablet (100 mg total) by mouth as needed for erectile dysfunction.  Marland Kitchen. amoxicillin-clavulanate (AUGMENTIN) 875-125 MG per tablet Take 1 tablet by mouth 2 (two) times daily.  . [DISCONTINUED] loratadine (CLARITIN) 10 MG tablet Take 1 tablet (10 mg total) by mouth daily.

## 2013-09-20 NOTE — Patient Instructions (Signed)
Take augmentin (amoxicillin/clavulinic acid) one twice a day for 7days Stay on Advair one puff twice daily Return 4 months

## 2013-09-21 NOTE — Assessment & Plan Note (Addendum)
COPD with asthmatic bronchitic component with ongoing tobacco use Plan The patient was clear to use vapor E. cigarettes for smoking cessation particularly since the patient has failed Chantix and nicotine replacement therapy in the past Note the patient has mild acute sinusitis and bronchitis and for this a seven-day course of Augmentin will be prescribed Maintain Advair

## 2013-11-04 ENCOUNTER — Other Ambulatory Visit: Payer: Self-pay | Admitting: *Deleted

## 2013-11-04 MED ORDER — LISINOPRIL 40 MG PO TABS: 40.0000 mg | ORAL_TABLET | Freq: Every day | ORAL | Status: DC

## 2013-11-05 NOTE — Telephone Encounter (Signed)
Called to pharmacy 

## 2013-11-09 ENCOUNTER — Other Ambulatory Visit: Payer: Self-pay | Admitting: Internal Medicine

## 2013-11-18 ENCOUNTER — Ambulatory Visit (INDEPENDENT_AMBULATORY_CARE_PROVIDER_SITE_OTHER): Payer: BC Managed Care – PPO | Admitting: Internal Medicine

## 2013-11-18 ENCOUNTER — Encounter: Payer: Self-pay | Admitting: Internal Medicine

## 2013-11-18 VITALS — BP 136/88 | HR 100 | Temp 97.6°F | Ht 64.0 in | Wt 142.9 lb

## 2013-11-18 DIAGNOSIS — R252 Cramp and spasm: Secondary | ICD-10-CM

## 2013-11-18 DIAGNOSIS — E785 Hyperlipidemia, unspecified: Secondary | ICD-10-CM

## 2013-11-18 DIAGNOSIS — L299 Pruritus, unspecified: Secondary | ICD-10-CM

## 2013-11-18 MED ORDER — HYDROXYZINE PAMOATE 25 MG PO CAPS: 25.0000 mg | ORAL_CAPSULE | Freq: Four times a day (QID) | ORAL | Status: DC | PRN

## 2013-11-18 MED ORDER — HYDROCORTISONE 1 % EX CREA: TOPICAL_CREAM | CUTANEOUS | Status: AC

## 2013-11-18 NOTE — Assessment & Plan Note (Signed)
Pt with LDL goal <130.  Consider d/c statin for now given that he does not have diabetes or CAD.

## 2013-11-18 NOTE — Assessment & Plan Note (Signed)
States that he is having leg and hand cramps in the evening after taking his cholesterol medicine.  Will hold his pravastatin for 2 weeks and have pt return to clinic for reassessment.

## 2013-11-18 NOTE — Assessment & Plan Note (Signed)
Started itching over past week.  Multiple excoriations and long whelps over upper shoulders (interscapular area spared), truncal region and legs.  No sick contacts, new exposure only with new fabric softener. Clinically appears if itch came before rash although pt unclear of timing.   -Advise to stop using scented or new soaps and fabric softner. -will check CMET and CBC with diff to eval for systemic etiology -tx symptomatically with hydroxyzine and hydrocortisone cream -return in 2 weeks.

## 2013-11-18 NOTE — Patient Instructions (Addendum)
It is unclear of the cause of your itching. Please do not use new scented soaps, lotions or laundry detergent. We will prescribed an anti-itch pill and cream.  The cream can be purchased over the counter at the pharmacy. Stop taking the pravastatin for now and return to clinic in 2 weeks to get an update on your itching and leg pains. Please bring your medicines with you each time you come.   Medicines may be  Eye drops  Herbal   Vitamins  Pills  Seeing these help [place] take care of you.  Pruritus  Pruritis is an itch. There are many different problems that can cause an itch. Dry skin is one of the most common causes of itching. Most cases of itching do not require medical attention.  HOME CARE INSTRUCTIONS  Make sure your skin is moistened on a regular basis. A moisturizer that contains petroleum jelly is best for keeping moisture in your skin. If you develop a rash, you may try the following for relief:   Use corticosteroid cream.  Apply cool compresses to the affected areas.  Bathe with Epsom salts or baking soda in the bathwater.  Soak in colloidal oatmeal baths. These are available at your pharmacy.  Apply baking soda paste to the rash. Stir water into baking soda until it reaches a paste-like consistency.  Use an anti-itch lotion.  Take over-the-counter diphenhydramine medicine by mouth as the instructions direct.  Avoid scratching. Scratching may cause the rash to become infected. If itching is very bad, your caregiver may suggest prescription lotions or creams to lessen your symptoms.  Avoid hot showers, which can make itching worse. A cold shower may help with itching as long as you use a moisturizer after the shower. SEEK MEDICAL CARE IF: The itching does not go away after several days. Document Released: 05/01/2011 Document Revised: 11/11/2011 Document Reviewed: 05/01/2011 [facility] Patient Information 2014 [place], [place].  Losartan tablets What is this  medicine? LOSARTAN (loe SAR tan) is used to treat high blood pressure and to reduce the risk of stroke in certain patients. This drug also slows the progression of kidney disease in patients with diabetes. This medicine may be used for other purposes; ask your health care provider or pharmacist if you have questions. COMMON BRAND NAME(S): Cozaar What should I tell my health care provider before I take this medicine? They need to know if you have any of these conditions: -heart failure -kidney or liver disease -an unusual or allergic reaction to losartan, other medicines, foods, dyes, or preservatives -pregnant or trying to get pregnant -breast-feeding How should I use this medicine? Take this medicine by mouth with a glass of water. Follow the directions on the prescription label. This medicine can be taken with or without food. Take your doses at regular intervals. Do not take your medicine more often than directed. Talk to your pediatrician regarding the use of this medicine in children. Special care may be needed. Overdosage: If you think you have taken too much of this medicine contact a poison control center or emergency room at once. NOTE: This medicine is only for you. Do not share this medicine with others. What if I miss a dose? If you miss a dose, take it as soon as you can. If it is almost time for your next dose, take only that dose. Do not take double or extra doses. What may interact with this medicine? -blood pressure medicines -diuretics, especially triamterene, spironolactone, or amiloride -fluconazole -NSAIDs, medicines for  pain and inflammation, like ibuprofen or naproxen -potassium salts or potassium supplements -rifampin This list may not describe all possible interactions. Give your health care provider a list of all the medicines, herbs, non-prescription drugs, or dietary supplements you use. Also tell them if you smoke, drink alcohol, or use illegal drugs. Some items  may interact with your medicine. What should I watch for while using this medicine? Visit your doctor or health care professional for regular checks on your progress. Check your blood pressure as directed. Ask your doctor or health care professional what your blood pressure should be and when you should contact him or her. Call your doctor or health care professional if you notice an irregular or fast heart beat. Women should inform their doctor if they wish to become pregnant or think they might be pregnant. There is a potential for serious side effects to an unborn child, particularly in the second or third trimester. Talk to your health care professional or pharmacist for more information. You may get drowsy or dizzy. Do not drive, use machinery, or do anything that needs mental alertness until you know how this drug affects you. Do not stand or sit up quickly, especially if you are an older patient. This reduces the risk of dizzy or fainting spells. Alcohol can make you more drowsy and dizzy. Avoid alcoholic drinks. Avoid salt substitutes unless you are told otherwise by your doctor or health care professional. Do not treat yourself for coughs, colds, or pain while you are taking this medicine without asking your doctor or health care professional for advice. Some ingredients may increase your blood pressure. What side effects may I notice from receiving this medicine? Side effects that you should report to your doctor or health care professional as soon as possible: -confusion, dizziness, light headedness or fainting spells -decreased amount of urine passed -difficulty breathing or swallowing, hoarseness, or tightening of the throat -fast or irregular heart beat, palpitations, or chest pain -skin rash, itching -swelling of your face, lips, tongue, hands, or feet Side effects that usually do not require medical attention (report to your doctor or health care professional if they continue or are  bothersome): -cough -decreased sexual function or desire -headache -nasal congestion or stuffiness -nausea or stomach pain -sore or cramping muscles This list may not describe all possible side effects. Call your doctor for medical advice about side effects. You may report side effects to FDA at 1-800-FDA-1088. Where should I keep my medicine? Keep out of the reach of children. Store at room temperature between 15 and 30 degrees C (59 and 86 degrees F). Protect from light. Keep container tightly closed. Throw away any unused medicine after the expiration date. NOTE: This sheet is a summary. It may not cover all possible information. If you have questions about this medicine, talk to your doctor, pharmacist, or health care provider.  2014, [employer] Standard. (2007-10-30 16:42:18)

## 2013-11-18 NOTE — Progress Notes (Signed)
   Subjective:    Patient ID: Samuel Chen, male    DOB: 03-30-1956, 58 y.o.   MRN: [id number]  HPI  Pt presents with complaints of itchiness for about a week.  Is unclear if the itch or "rash" started first. States that his mother washed his clothes in a new fabric softner but other than that no new foods, animal exposures, or sick contacts. Also reports that he is having leg and hand cramps at night after taking the "cholesterol pill".  Review of Systems  Constitutional: Negative for fever and fatigue.  HENT: Negative for mouth sores.   Eyes: Negative for discharge, itching and visual disturbance.  Respiratory: Negative for cough, chest tightness and shortness of breath.   Cardiovascular: Negative for chest pain, palpitations and leg swelling.  Gastrointestinal: Negative for nausea and diarrhea.  Endocrine: Negative.   Genitourinary: Negative for dysuria.  Musculoskeletal: Positive for arthralgias and myalgias.       Hand and leg cramps  Allergic/Immunologic: Positive for environmental allergies.  Neurological: Negative for headaches.  Hematological: Does not bruise/bleed easily.  Psychiatric/Behavioral: Negative.        Objective:   Physical Exam  Constitutional: He is oriented to person, place, and time. He appears well-developed and well-nourished. No distress.  scratching during visit  HENT:  Head: Normocephalic and atraumatic.  Eyes: Conjunctivae and EOM are normal. Pupils are equal, round, and reactive to light.  Neck: No thyromegaly present.  Cardiovascular: Regular rhythm and normal heart sounds.   Slightly tachycardic  Pulmonary/Chest: Effort normal and breath sounds normal. He has no wheezes.  Abdominal: Soft. Bowel sounds are normal.  Musculoskeletal: He exhibits no edema and no tenderness.  Neurological: He is alert and oriented to person, place, and time.  Skin: No ecchymosis, no laceration, no petechiae and no rash noted. No erythema.     Psychiatric: He  has a normal mood and affect.          Assessment & Plan:  See separate problem-list charting:

## 2013-11-19 LAB — CBC WITH DIFFERENTIAL/PLATELET
Basophils Absolute: 0 10*3/uL (ref 0.0–0.1)
Basophils Relative: 0 % (ref 0–1)
Eosinophils Absolute: 0.1 10*3/uL (ref 0.0–0.7)
Eosinophils Relative: 1 % (ref 0–5)
HCT: 40 % (ref 39.0–52.0)
Hemoglobin: 13.9 g/dL (ref 13.0–17.0)
Lymphocytes Relative: 22 % (ref 12–46)
Lymphs Abs: 1.9 10*3/uL (ref 0.7–4.0)
MCH: 31 pg (ref 26.0–34.0)
MCHC: 34.8 g/dL (ref 30.0–36.0)
MCV: 89.3 fL (ref 78.0–100.0)
Monocytes Absolute: 1 10*3/uL (ref 0.1–1.0)
Monocytes Relative: 11 % (ref 3–12)
Neutro Abs: 5.7 10*3/uL (ref 1.7–7.7)
Neutrophils Relative %: 66 % (ref 43–77)
Platelets: 222 10*3/uL (ref 150–400)
RBC: 4.48 MIL/uL (ref 4.22–5.81)
RDW: 13.3 % (ref 11.5–15.5)
WBC: 8.7 10*3/uL (ref 4.0–10.5)

## 2013-11-19 LAB — COMPLETE METABOLIC PANEL WITH GFR
ALT: 43 U/L (ref 0–53)
AST: 35 U/L (ref 0–37)
Albumin: 4.6 g/dL (ref 3.5–5.2)
Alkaline Phosphatase: 80 U/L (ref 39–117)
BUN: 13 mg/dL (ref 6–23)
CO2: 22 mEq/L (ref 19–32)
Calcium: 9.5 mg/dL (ref 8.4–10.5)
Chloride: 99 mEq/L (ref 96–112)
Creat: 1.15 mg/dL (ref 0.50–1.35)
GFR, Est African American: 82 mL/min
GFR, Est Non African American: 71 mL/min
Glucose, Bld: 67 mg/dL — ABNORMAL LOW (ref 70–99)
Potassium: 3.9 mEq/L (ref 3.5–5.3)
Sodium: 135 mEq/L (ref 135–145)
Total Bilirubin: 0.4 mg/dL (ref 0.2–1.2)
Total Protein: 7.7 g/dL (ref 6.0–8.3)

## 2013-11-19 LAB — CK: Total CK: 490 U/L — ABNORMAL HIGH (ref 7–232)

## 2013-11-19 NOTE — Progress Notes (Signed)
Case discussed with Dr. Schooler soon after the resident saw the patient.  We reviewed the resident's history and exam and pertinent patient test results.  I agree with the assessment, diagnosis, and plan of care documented in the resident's note. 

## 2013-11-25 ENCOUNTER — Ambulatory Visit: Payer: No Typology Code available for payment source

## 2013-11-30 ENCOUNTER — Other Ambulatory Visit: Payer: Self-pay | Admitting: *Deleted

## 2013-11-30 DIAGNOSIS — J019 Acute sinusitis, unspecified: Secondary | ICD-10-CM

## 2013-12-01 MED ORDER — MOMETASONE FUROATE 50 MCG/ACT NA SUSP: 2.0000 | Freq: Every day | NASAL | Status: DC

## 2013-12-01 NOTE — Telephone Encounter (Signed)
Called to pharm 

## 2013-12-02 ENCOUNTER — Ambulatory Visit: Payer: Self-pay | Admitting: Internal Medicine

## 2013-12-17 ENCOUNTER — Other Ambulatory Visit: Payer: Self-pay | Admitting: *Deleted

## 2013-12-23 ENCOUNTER — Ambulatory Visit (INDEPENDENT_AMBULATORY_CARE_PROVIDER_SITE_OTHER): Payer: BC Managed Care – PPO | Admitting: Internal Medicine

## 2013-12-23 ENCOUNTER — Encounter: Payer: Self-pay | Admitting: Internal Medicine

## 2013-12-23 VITALS — BP 135/82 | HR 89 | Temp 97.8°F | Ht 64.0 in | Wt 145.7 lb

## 2013-12-23 DIAGNOSIS — N179 Acute kidney failure, unspecified: Secondary | ICD-10-CM

## 2013-12-23 DIAGNOSIS — L299 Pruritus, unspecified: Secondary | ICD-10-CM

## 2013-12-23 DIAGNOSIS — I10 Essential (primary) hypertension: Secondary | ICD-10-CM

## 2013-12-23 DIAGNOSIS — E785 Hyperlipidemia, unspecified: Secondary | ICD-10-CM

## 2013-12-23 LAB — BASIC METABOLIC PANEL WITH GFR
BUN: 12 mg/dL (ref 6–23)
CHLORIDE: 101 meq/L (ref 96–112)
CO2: 24 mEq/L (ref 19–32)
Calcium: 9.7 mg/dL (ref 8.4–10.5)
Creat: 1.1 mg/dL (ref 0.50–1.35)
GFR, Est African American: 87 mL/min
GFR, Est Non African American: 75 mL/min
Glucose, Bld: 84 mg/dL (ref 70–99)
POTASSIUM: 4.9 meq/L (ref 3.5–5.3)
SODIUM: 134 meq/L — AB (ref 135–145)

## 2013-12-23 NOTE — Assessment & Plan Note (Signed)
Pt off statin 2/2 muscle cramping. Will recheck lipid panel at next clinic visit in 3 months.

## 2013-12-23 NOTE — Progress Notes (Signed)
Patient ID: Shaune Pascalhillip K Lia, male   DOB: 1955/10/16, 58 y.o.   MRN: [id number]  Subjective:   Patient ID: Margarita Mailhillip K Bernard male   DOB: 1955/10/16 58 y.o.   MRN: [id number]  HPI: Mr.Jaeger K Anastasia is a 58 y.o. M w/ PMH HTN, HLD, tobacco abuse, and EtOH abuse presents to the clinic for a f/u after being seen for muscle cramps and pruritis.  He was seen by Dr. Bosie ClosSchooler in March c/o muscle cramping related to his statin. The statin was stopped and the cramping has greatly improved. He still endorses some mild cramping in his hands. He is drinking 4-5 beers almost every day, which could be contributing. Electrolytes normal on CMP.   The pruritis  was treated with hydroxyzine and hydrocortisone cream. A CBC and CMP were obtained and were wnl. The itching has resolved. He states it was due to a new fabric softener and once it was stopped, the itching went away.   Cr was elevated in March above his BL around 0.8 to 1.15. He is taking Mobic every day for L shoulder arthritis per Dr. Margaretha Sheffieldraper with Sports medicine.   He denies any chest pain, SOB, N/V, abdominal pain, or trouble urinating.  Past Medical History  Diagnosis Date  . GERD (gastroesophageal reflux disease)   . Hypertension   . Hyperlipidemia   . COPD (chronic obstructive pulmonary disease)     Questionable diagnosis.  . 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 Myalgia 11/2007    Likely 2/2 Pravachol  . Dyshidrotic eczema   . Tobacco abuse   . Allergic rhinitis    Current Outpatient Prescriptions  Medication Sig Dispense Refill  . albuterol (PROVENTIL HFA;VENTOLIN HFA) 108 (90 BASE) MCG/ACT inhaler Inhale 2 puffs into the lungs every 6 (six) hours as needed for wheezing.  1 Inhaler  6  . amoxicillin-clavulanate (AUGMENTIN) 875-125 MG per tablet Take 1 tablet by mouth 2 (two) times daily.  14 tablet  0  . clobetasol ointment (TEMOVATE) 0.05 % Apply topically 2 (two) times daily as needed. Apply to the itchy areas on legs  twice a day for seven days  60 g  3  . esomeprazole (NEXIUM) 20 MG capsule Take 1 capsule (20 mg total) by mouth daily.  90 capsule  3  . Fluticasone-Salmeterol (ADVAIR) 250-50 MCG/DOSE AEPB Inhale 1 puff into the lungs 2 (two) times daily.  1 each  11  . hydrocortisone cream 1 % Apply to affected area 2-3 times daily  30 g  1  . hydrOXYzine (VISTARIL) 25 MG capsule Take 1 capsule (25 mg total) by mouth every 6 (six) hours as needed for itching.  30 capsule  0  . lisinopril (PRINIVIL,ZESTRIL) 40 MG tablet Take 1 tablet (40 mg total) by mouth daily.  90 tablet  3  . meloxicam (MOBIC) 15 MG tablet TAKE ONE TABLET BY MOUTH ONCE DAILY  30 tablet  0  . mometasone (NASONEX) 50 MCG/ACT nasal spray Place 2 sprays into the nose daily. One in each nostril  17 g  11  . pravastatin (PRAVACHOL) 40 MG tablet Take 1 tablet (40 mg total) by mouth daily.  30 tablet  6  . sildenafil (VIAGRA) 100 MG tablet Take 1 tablet (100 mg total) by mouth as needed for erectile dysfunction.  30 tablet  5   No current facility-administered medications for this visit.   Family History  Problem Relation Age of Onset  . Heart disease Sister   .  Heart attack Sister     34-35yo  . Heart disease Father    History   Social History  . Marital Status: Single    Spouse Name: N/A    Number of Children: N/A  . Years of Education: N/A   Social History Main Topics  . Smoking status: Former Smoker -- 1.50 packs/day for 40 years    Types: Cigarettes    Start date: 06/03/1983    Quit date: 03/04/2013  . Smokeless tobacco: Never Used     Comment: Started smoking at age 58. PATIENT IS NOW USING E CIGARETTES  . Alcohol Use: Yes     Comment: 6 to 7 beers daily x 25 years at least   . Drug Use: No  . Sexual Activity: Yes   Other Topics Concern  . None   Social History Narrative   [facility] and [facility] card per Xcel EnergyDeborah Hill October 13,2011 4:21PM   Review of Systems: A 12 point  ROS was performed; pertinent positives and negatives were noted in the HPI   Objective:  Physical Exam: Filed Vitals:   12/23/13 1519  BP: 135/82  Pulse: 89  Temp: 97.8 F (36.6 C)  TempSrc: Oral  Height: 5\' 4"  (1.626 m)  Weight: 145 lb 11.2 oz (66.089 kg)  SpO2: 99%   Constitutional: Vital signs reviewed.  Patient is a well-developed and well-nourished male in no acute distress and cooperative with exam.   Head: Normocephalic and atraumatic Eyes: PERRL, EOMI, conjunctivae normal, No scleral icterus.  Neck: Supple, trachea midline, normal ROM Cardiovascular: RRR, no MRG Pulmonary/Chest: Normal respiratory effort, CTAB, no wheezes, rales, or rhonchi Abdominal: Soft. Non-tender, non-distended, bowel sounds are normal Musculoskeletal: No joint deformities, erythema, or stiffness, ROM full Neurological: A&O x3, Strength is normal and symmetric bilaterally, cranial nerve II-XII are grossly intact, no focal motor deficit, sensory intact to light touch bilaterally.  Skin: Warm, dry and intact. No rash. Psychiatric: Normal mood and affect. speech and behavior is normal.   Assessment & Plan:   Please refer to Problem List based Assessment and Plan

## 2013-12-23 NOTE — Assessment & Plan Note (Addendum)
BMP from March with elevated Cr to 1.15 from BL of 0.8. He is taking Mobic daily and is on Lisinopril 40mg  daily. His Cr was previously stable. Rechecked at clinic visit and Cr 1.10.  Will continue holding Mobic for now. Continuing the Lisinopril. Will reassess at his next clinic visit.

## 2013-12-23 NOTE — Assessment & Plan Note (Signed)
BP Readings from Last 3 Encounters:  12/23/13 135/82  11/18/13 136/88  09/20/13 128/78    Lab Results  Component Value Date   NA 135 11/18/2013   K 3.9 11/18/2013   CREATININE 1.15 11/18/2013    Assessment: Blood pressure control: controlled Progress toward BP goal:  at goal   Plan: Medications:  continue current medications Educational resources provided: brochure;handout;video Self management tools provided:   Other plans: F/u 3 mo

## 2013-12-23 NOTE — Assessment & Plan Note (Signed)
This was attributed to a change in fabric softener. Once the detergent was changed, the itching resolved.

## 2013-12-26 NOTE — Progress Notes (Signed)
Case discussed with Dr. Glenn soon after the resident saw the patient.  We reviewed the resident's history and exam and pertinent patient test results.  I agree with the assessment, diagnosis, and plan of care documented in the resident's note. 

## 2014-01-04 ENCOUNTER — Encounter: Payer: Self-pay | Admitting: Internal Medicine

## 2014-01-06 ENCOUNTER — Encounter: Payer: Self-pay | Admitting: Internal Medicine

## 2014-01-13 ENCOUNTER — Encounter: Payer: Self-pay | Admitting: Internal Medicine

## 2014-01-18 ENCOUNTER — Other Ambulatory Visit: Payer: Self-pay | Admitting: *Deleted

## 2014-01-18 DIAGNOSIS — J449 Chronic obstructive pulmonary disease, unspecified: Secondary | ICD-10-CM

## 2014-01-18 MED ORDER — FLUTICASONE-SALMETEROL 250-50 MCG/DOSE IN AEPB: 1.0000 | INHALATION_SPRAY | Freq: Two times a day (BID) | RESPIRATORY_TRACT | Status: DC

## 2014-01-18 NOTE — Telephone Encounter (Signed)
[facility]  [facility]  [facility] have [facility]ley at [facility] and cancelled the advair rx.

## 2014-03-03 ENCOUNTER — Encounter: Payer: Self-pay | Admitting: Internal Medicine

## 2014-03-03 ENCOUNTER — Ambulatory Visit (INDEPENDENT_AMBULATORY_CARE_PROVIDER_SITE_OTHER): Payer: No Typology Code available for payment source | Admitting: Internal Medicine

## 2014-03-03 VITALS — BP 117/74 | HR 94 | Temp 98.1°F | Wt 149.1 lb

## 2014-03-03 DIAGNOSIS — N179 Acute kidney failure, unspecified: Secondary | ICD-10-CM

## 2014-03-03 DIAGNOSIS — I10 Essential (primary) hypertension: Secondary | ICD-10-CM

## 2014-03-03 DIAGNOSIS — E785 Hyperlipidemia, unspecified: Secondary | ICD-10-CM

## 2014-03-03 DIAGNOSIS — R252 Cramp and spasm: Secondary | ICD-10-CM

## 2014-03-03 NOTE — Assessment & Plan Note (Addendum)
Cramping present despite being off diuretics and statin. He was mildly hyponatremic at his last visit, likely 2/2 to his EtOH use, which is probably contributing to his symptoms. Will repeat a CMP today and check Mg. Encouraged cutting back on EtOH use and staying hydrated with water and sports drinks, but to watch the sugar intake.  - F/u in 1 mo

## 2014-03-03 NOTE — Patient Instructions (Addendum)
**Work on cutting back your alcohol consumption **Try to stay hydrated and drink plenty of water, 8 glasses a day (8 oz each)  General Instructions:   Please bring your medicines with you each time you come to clinic.  Medicines may include prescription medications, over-the-counter medications, herbal remedies, eye drops, vitamins, or other pills.   Progress Toward Treatment Goals:  Treatment Goal 12/23/2013  Blood pressure at goal  Stop smoking -    Self Care Goals & Plans:  Self Care Goal 12/23/2013  Manage my medications take my medicines as prescribed; bring my medications to every visit; refill my medications on time; follow the sick day instructions if I am sick  Monitor my health keep track of my blood pressure; keep track of my weight  Eat healthy foods eat more vegetables; eat fruit for snacks and desserts; eat smaller portions  Be physically active find an activity I enjoy  Stop smoking -    No flowsheet data found.   Care Management & Community Referrals:  Referral 07/08/2013  Referrals made for care management support none needed           Back Exercises Back exercises help treat and prevent back injuries. The goal of back exercises is to increase the strength of your abdominal and back muscles and the flexibility of your back. These exercises should be started when you no longer have back pain. Back exercises include:  Pelvic Tilt. Lie on your back with your knees bent. Tilt your pelvis until the lower part of your back is against the floor. Hold this position 5 to 10 sec and repeat 5 to 10 times.  Knee to Chest. Pull first 1 knee up against your chest and hold for 20 to 30 seconds, repeat this with the other knee, and then both knees. This may be done with the other leg straight or bent, whichever feels better.  Sit-Ups or Curl-Ups. Bend your knees 90 degrees. Start with tilting your pelvis, and do a partial, slow sit-up, lifting your trunk only 30 to 45  degrees off the floor. Take at least 2 to 3 seconds for each sit-up. Do not do sit-ups with your knees out straight. If partial sit-ups are difficult, simply do the above but with only tightening your abdominal muscles and holding it as directed.  Hip-Lift. Lie on your back with your knees flexed 90 degrees. Push down with your feet and shoulders as you raise your hips a couple inches off the floor; hold for 10 seconds, repeat 5 to 10 times.  Back arches. Lie on your stomach, propping yourself up on bent elbows. Slowly press on your hands, causing an arch in your low back. Repeat 3 to 5 times. Any initial stiffness and discomfort should lessen with repetition over time.  Shoulder-Lifts. Lie face down with arms beside your body. Keep hips and torso pressed to floor as you slowly lift your head and shoulders off the floor. Do not overdo your exercises, especially in the beginning. Exercises may cause you some mild back discomfort which lasts for a few minutes; however, if the pain is more severe, or lasts for more than 15 minutes, do not continue exercises until you see your caregiver. Improvement with exercise therapy for back problems is slow.  See your caregivers for assistance with developing a proper back exercise program. Document Released: 09/26/2004 Document Revised: 11/11/2011 Document Reviewed: 06/20/2011 [facility] Patient Information 2015 [employer]. This information is not intended to replace advice given to you by  your health care provider. Make sure you discuss any questions you have with your health care provider.  

## 2014-03-03 NOTE — Assessment & Plan Note (Addendum)
Statin was held in March 2/2 muscle cramping. Lipid panel normal in Dec '14. Will recheck lipid panel today.   Addendum: 03/04/14: Statin held in March 2/2 cramping, but pt still with cramping off the statin. Lipid panel with elevated total cholesterol 238 and LDL 155. Restarting pravastatin 40mg  daily.

## 2014-03-03 NOTE — Progress Notes (Signed)
Patient ID: Samuel Chen, male   DOB: 07-27-1956, 58 y.o.   MRN: [id number]  Subjective:   Patient ID: Samuel Chen male   DOB: 07-27-1956 58 y.o.   MRN: [id number]  HPI: Samuel Chen is a 58 y.o. M w/ PMH COPD Gold B, HTN, HLD, tobacco abuse, and EtOH use presents for an acute visit for muscle spasms.   He has a h/o of intermittent muscle cramping, initally attributed to his diuretic, HCTZ. This was stopped and his symptoms improved in 2014. He was seen in March again c/o muscle cramping, and his statin was discontinued. The patient felt that his symptoms improved after stopping the statin but did not resolve completely. BMP was w/o electrolyte abnormality, except mild hyponatremia. Of note, he does drink 4-5 beers a night, which was also thought to be contributing to his symptoms and he was encouraged to cut back/stop drinking.   He presents today c/o continued cramping that occurs primarily after doing strenuous exercise, for ex, mowing the lawn outside. He states that he the cramping occurs primarily in his hands and feet, but does occur in his back and calf when lying down at night, about 2x week. The cramping is improved with stretching.  He states that he is trying to staying hydrated and drinks about 2-3 16oz bottles of water a day.     Past Medical History  Diagnosis Date  . GERD (gastroesophageal reflux disease)   . Hypertension   . Hyperlipidemia   . COPD (chronic obstructive pulmonary disease)     Questionable diagnosis.  . 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 Myalgia 11/2007    Likely 2/2 Pravachol  . Dyshidrotic eczema   . Tobacco abuse   . Allergic rhinitis    Current Outpatient Prescriptions  Medication Sig Dispense Refill  . albuterol (PROVENTIL HFA;VENTOLIN HFA) 108 (90 BASE) MCG/ACT inhaler Inhale 2 puffs into the lungs every 6 (six) hours as needed for wheezing.  1 Inhaler  6  . esomeprazole (NEXIUM) 20 MG capsule Take 1 capsule (20 mg  total) by mouth daily.  90 capsule  3  . Fluticasone-Salmeterol (ADVAIR) 250-50 MCG/DOSE AEPB Inhale 1 puff into the lungs 2 (two) times daily.  1 each  6  . lisinopril (PRINIVIL,ZESTRIL) 40 MG tablet Take 1 tablet (40 mg total) by mouth daily.  90 tablet  3  . mometasone (NASONEX) 50 MCG/ACT nasal spray Place 2 sprays into the nose daily. One in each nostril  17 g  11  . sildenafil (VIAGRA) 100 MG tablet Take 1 tablet (100 mg total) by mouth as needed for erectile dysfunction.  30 tablet  5  . clobetasol ointment (TEMOVATE) 0.05 % Apply topically 2 (two) times daily as needed. Apply to the itchy areas on legs twice a day for seven days  60 g  3  . hydrocortisone cream 1 % Apply to affected area 2-3 times daily  30 g  1  . hydrOXYzine (VISTARIL) 25 MG capsule Take 1 capsule (25 mg total) by mouth every 6 (six) hours as needed for itching.  30 capsule  0   No current facility-administered medications for this visit.   Family History  Problem Relation Age of Onset  . Heart disease Sister   . Heart attack Sister     34-35yo  . Heart disease Father    History   Social History  . Marital Status: Single    Spouse Name: N/A  Number of Children: N/A  . Years of Education: N/A   Social History Main Topics  . Smoking status: Former Smoker -- 1.50 packs/day for 40 years    Types: Cigarettes    Start date: 06/03/1983    Quit date: 03/04/2013  . Smokeless tobacco: Never Used     Comment: Started smoking at age 58. PATIENT IS NOW USING E CIGARETTES  . Alcohol Use: Yes     Comment: 6 to 7 beers daily x 25 years at least   . Drug Use: No  . Sexual Activity: Yes   Other Topics Concern  . Not on file   Social History Narrative   [facility] and [facility] card per Rudell Cobbeborah Hill October 13,2011 4:21PM   Review of Systems: Constitutional: Denies fever, chills, diaphoresis, appetite change and fatigue.  HEENT: Denies congestion, sore throat,  sneezing Respiratory: Denies SOB, DOE.   Cardiovascular: Denies chest pain or leg swelling.  Gastrointestinal: Denies nausea, vomiting, abdominal pain, diarrhea, constipation Genitourinary: Denies dysuria, urgency, frequency, hematuria Musculoskeletal: Denies myalgias, arthralgias, or gait problem. +muscle spasms Skin: Denies rash or wound.  Neurological: Denies dizziness, syncope, or weakness Psychiatric/Behavioral: Denies mood changes or confusion,   Objective:  Physical Exam: Filed Vitals:   03/03/14 1427  BP: 117/74  Pulse: 94  Temp: 98.1 F (36.7 C)  TempSrc: Oral  Weight: 149 lb 1.6 oz (67.631 kg)  SpO2: 98%   Constitutional: Vital signs reviewed.  Patient is a well-developed and well-nourished male in no acute distress and cooperative with exam. Alert and oriented x3.  Head: Normocephalic and atraumatic Eyes: PERRL, EOMI. No scleral icterus.  Neck: Normal ROM Cardiovascular: RRR, no MRG, pulses symmetric and intact bilaterally Pulmonary/Chest: Normal respiratory effort, CTAB, no wheezes, rales, or rhonchi Abdominal: Soft. Non-tender, non-distended, bowel sounds are normal, no masses, organomegaly, or guarding present.  Musculoskeletal: No joint deformities, erythema, or stiffness Neurological: A&O x3, Strength is normal and symmetric bilaterally: 5/5 in BU&LE, cranial nerve II-XII are grossly intact, no focal motor deficit, sensory intact to light touch bilaterally.  Skin: Warm, dry and intact. Psychiatric: Normal mood and affect. speech and behavior is normal.   Assessment & Plan:   Please refer to Problem List based Assessment and Plan

## 2014-03-03 NOTE — Assessment & Plan Note (Addendum)
Cr stable at last visit in April, but still elevated from baseline of about 0.8. His lisinopril was continued at that time, but his Mobic had been held. Will reassess renal function today.   Addendum: 03/04/14: Cr (and K) more elevated on BMP. Stopping the lisinopril, as his blood pressure was low normal and will reassess in 2 weeks.

## 2014-03-04 LAB — COMPLETE METABOLIC PANEL WITH GFR
ALT: 46 U/L (ref 0–53)
AST: 31 U/L (ref 0–37)
Albumin: 4.4 g/dL (ref 3.5–5.2)
Alkaline Phosphatase: 59 U/L (ref 39–117)
BUN: 20 mg/dL (ref 6–23)
CALCIUM: 9.4 mg/dL (ref 8.4–10.5)
CHLORIDE: 105 meq/L (ref 96–112)
CO2: 23 meq/L (ref 19–32)
Creat: 1.43 mg/dL — ABNORMAL HIGH (ref 0.50–1.35)
GFR, EST AFRICAN AMERICAN: 63 mL/min
GFR, Est Non African American: 55 mL/min — ABNORMAL LOW
Glucose, Bld: 110 mg/dL — ABNORMAL HIGH (ref 70–99)
Potassium: 5.2 mEq/L (ref 3.5–5.3)
Sodium: 137 mEq/L (ref 135–145)
TOTAL PROTEIN: 7.1 g/dL (ref 6.0–8.3)
Total Bilirubin: 0.3 mg/dL (ref 0.2–1.2)

## 2014-03-04 LAB — LIPID PANEL
Cholesterol: 238 mg/dL — ABNORMAL HIGH (ref 0–200)
HDL: 54 mg/dL (ref >39)
LDL Cholesterol: 155 mg/dL — ABNORMAL HIGH (ref 0–99)
Total CHOL/HDL Ratio: 4.4 Ratio
Triglycerides: 145 mg/dL (ref <150)
VLDL: 29 mg/dL (ref 0–40)

## 2014-03-04 LAB — MAGNESIUM: Magnesium: 1.8 mg/dL (ref 1.5–2.5)

## 2014-03-04 MED ORDER — PRAVASTATIN SODIUM 40 MG PO TABS: 40.0000 mg | ORAL_TABLET | Freq: Every day | ORAL | Status: DC

## 2014-03-04 NOTE — Assessment & Plan Note (Addendum)
BP Readings from Last 3 Encounters:  03/03/14 117/74  12/23/13 135/82  11/18/13 136/88    Lab Results  Component Value Date   NA 137 03/03/2014   K 5.2 03/03/2014   CREATININE 1.43* 03/03/2014    Assessment: Blood pressure control: controlled Progress toward BP goal:  at goal Comments: His blood pressure is well controlled on the lisinopril; however, his renal function on BMP has worsened.   Plan: Medications:  Stopping the lisinopril and will reassess blood pressure control in 2 weeks  Other plans: Will check BMP at that time to assess his renal function and potassium off the lisinopril

## 2014-03-04 NOTE — Addendum Note (Signed)
Addended by: Charlsie MerlesGLENN, Gabe Glace F on: 03/04/2014 01:39 PM   Modules accepted: Orders, Medications

## 2014-03-04 NOTE — Progress Notes (Signed)
Case discussed with Dr. Glenn soon after the resident saw the patient.  We reviewed the resident's history and exam and pertinent patient test results.  I agree with the assessment, diagnosis, and plan of care documented in the resident's note. 

## 2014-03-14 ENCOUNTER — Ambulatory Visit (INDEPENDENT_AMBULATORY_CARE_PROVIDER_SITE_OTHER): Payer: No Typology Code available for payment source | Admitting: Sports Medicine

## 2014-03-14 ENCOUNTER — Encounter: Payer: Self-pay | Admitting: Sports Medicine

## 2014-03-14 VITALS — BP 175/96 | HR 94 | Ht 64.0 in | Wt 145.0 lb

## 2014-03-14 DIAGNOSIS — M25519 Pain in unspecified shoulder: Secondary | ICD-10-CM

## 2014-03-14 DIAGNOSIS — M75102 Unspecified rotator cuff tear or rupture of left shoulder, not specified as traumatic: Secondary | ICD-10-CM

## 2014-03-14 DIAGNOSIS — M25512 Pain in left shoulder: Secondary | ICD-10-CM

## 2014-03-14 DIAGNOSIS — M67919 Unspecified disorder of synovium and tendon, unspecified shoulder: Secondary | ICD-10-CM

## 2014-03-14 DIAGNOSIS — M719 Bursopathy, unspecified: Secondary | ICD-10-CM

## 2014-03-14 MED ORDER — METHYLPREDNISOLONE ACETATE 40 MG/ML IJ SUSP
40.0000 mg | Freq: Once | INTRAMUSCULAR | Status: AC
Start: 2014-03-14 — End: 2014-03-14
  Administered 2014-03-14: 40 mg via INTRA_ARTICULAR

## 2014-03-14 NOTE — Progress Notes (Signed)
   Subjective:    Patient ID: Samuel Chen, male    DOB: 1956-09-01, 58 y.o.   MRN: [id number]  HPI chief complaint: Left shoulder pain  58 year old patient comes in today requesting a repeat cortisone injection into his left shoulder. He has a documented history of osteoarthritis and rotator cuff syndrome in this same shoulder. Subacromial cortisone injection was last administered in October of last year. This provided him with several months of symptom relief. Pain has now returned over the past 4-5 weeks and is identical in nature to what he's experienced previously. Pain is diffuse in the shoulder. No recent trauma. No numbness and tingling into the left upper extremity.    Review of Systems     Objective:   Physical Exam Well-developed, well-nourished. No acute distress.  Left shoulder: Good range of motion with a positive painful ARC. Positive empty can, positive Hawkins. Rotator cuff strength is 5/5 but reproducible pain with resisted supraspinatus and external rotation. No tenderness to palpation over the bicipital groove. No tenderness over the a.c. joint. Neurovascularly intact distally.       Assessment & Plan:  Returning left shoulder pain secondary to osteoarthritis and rotator cuff syndrome  Patient's left subacromial space was reinjected today with cortisone. Tolerated this without any difficulty. Hopefully he will get an extended period of symptom relief with today's injection just as he has in the past. Followup when necessary.  Consent obtained and verified. Time-out conducted. Noted no overlying erythema, induration, or other signs of local infection. Skin prepped in a sterile fashion. Topical analgesic spray: Ethyl chloride. Joint: left shoulder (subacromial) Needle: 25g 1.5 inch Completed without difficulty. Meds: 3cc 1%xylocaine, 1cc (40mg ) depomedrol  Advised to call if fevers/chills, erythema, induration, drainage, or persistent bleeding.

## 2014-03-18 ENCOUNTER — Encounter: Payer: Self-pay | Admitting: Internal Medicine

## 2014-03-18 ENCOUNTER — Ambulatory Visit (INDEPENDENT_AMBULATORY_CARE_PROVIDER_SITE_OTHER): Payer: No Typology Code available for payment source | Admitting: Internal Medicine

## 2014-03-18 VITALS — BP 153/89 | HR 73 | Temp 97.5°F | Ht 64.0 in | Wt 151.3 lb

## 2014-03-18 DIAGNOSIS — I10 Essential (primary) hypertension: Secondary | ICD-10-CM

## 2014-03-18 DIAGNOSIS — E785 Hyperlipidemia, unspecified: Secondary | ICD-10-CM

## 2014-03-18 DIAGNOSIS — N179 Acute kidney failure, unspecified: Secondary | ICD-10-CM

## 2014-03-18 LAB — BASIC METABOLIC PANEL WITH GFR
BUN: 16 mg/dL (ref 6–23)
CO2: 28 meq/L (ref 19–32)
Calcium: 9.1 mg/dL (ref 8.4–10.5)
Chloride: 101 mEq/L (ref 96–112)
Creat: 1.3 mg/dL (ref 0.50–1.35)
GFR, EST AFRICAN AMERICAN: 71 mL/min
GFR, Est Non African American: 61 mL/min
GLUCOSE: 108 mg/dL — AB (ref 70–99)
Potassium: 4.1 mEq/L (ref 3.5–5.3)
SODIUM: 136 meq/L (ref 135–145)

## 2014-03-18 MED ORDER — PRAVASTATIN SODIUM 40 MG PO TABS: 40.0000 mg | ORAL_TABLET | Freq: Every day | ORAL | Status: DC

## 2014-03-18 MED ORDER — AMLODIPINE BESYLATE 10 MG PO TABS: 10.0000 mg | ORAL_TABLET | Freq: Every day | ORAL | Status: DC

## 2014-03-18 NOTE — Assessment & Plan Note (Signed)
BP Readings from Last 3 Encounters:  03/18/14 153/89  03/14/14 175/96  03/03/14 117/74    Lab Results  Component Value Date   NA 137 03/03/2014   K 5.2 03/03/2014   CREATININE 1.43* 03/03/2014    Assessment: Blood pressure control:  uncontrolled Progress toward BP goal:   deteriorated Comments: his BP has increased since he has been off lisinopril.  150s/80s today, 170s/90s at sports med visit a few days ago.  When he check it at home the SBP is usually 150s.  Plan: Medications:  Will not resume ACEI given recent AKI.  Will not re-try HCTZ given cramping and recent AKI.  He has used amlodipine in the past and experienced ADR of leg swelling, he did not have difficulty breathing or angioedema.  Of the anti-hypertensive choices this may be the best med to try again at this point to avoid AKI.   Other plans: start amlodipine 10mg  daily (he was instructed to stop taking and get ER help if he develops difficulty breathing, swelling in tongue or face); return to clinic in 2 months for BP check on amlodipine.  He should return to clinic earlier if he develops ADRs on amlodipine.

## 2014-03-18 NOTE — Assessment & Plan Note (Signed)
BMP today.  He was advised to decrease EtOH and increase water.

## 2014-03-18 NOTE — Patient Instructions (Signed)
1. Please take amlodipine daily.  If you develop new symptoms on this medication call clinic.  It may make your legs swell.  Call us if swelling is significant and does not go away.   2. Please take all medications as prescribed.     3. If you have worsening of your symptoms or new symptoms arise, please call the clinic [phone]), or go to the ER immediately if symptoms are severe.   Return to clinic in 2 weeks to check blood pressure on new medication.

## 2014-03-18 NOTE — Progress Notes (Signed)
   Subjective:    Patient ID: Samuel Chen, male    DOB: 1956-08-19, 58 y.o.   MRN: [id number]  HPI Comments: Mr. Samuel [name] is a 58 year old male with a PMH of HTN, COPD Gold B, HLD who presents for follow-up of AKI and for BP check off of lisinopril.  Cramping is improving; still gets it in his feet, worse at night.  Gets up and stands on feet which resolves the issue.  Says he is staying hydrated but drinking 5-6 beers 4/7 days out of the week. Says he has a varied diet.   Quit smoking cigarettes 1 year ago, smoking e-cigarette. No drug use.       Review of Systems  Constitutional: Negative for fever, chills and appetite change.  Eyes: Negative for visual disturbance.  Respiratory: Negative for cough and shortness of breath.   Cardiovascular: Negative for chest pain, palpitations and leg swelling.  Gastrointestinal: Negative for nausea, vomiting, abdominal pain, diarrhea, constipation and blood in stool.  Genitourinary: Negative for dysuria, frequency, decreased urine volume and difficulty urinating.  Neurological: Negative for syncope and headaches.       Objective:   Physical Exam  Vitals reviewed. Constitutional: He is oriented to person, place, and time. He appears well-developed. No distress.  HENT:  Head: Normocephalic and atraumatic.  Mouth/Throat: Oropharynx is clear and moist. No oropharyngeal exudate.  Eyes: EOM are normal. Pupils are equal, round, and reactive to light.  Neck: Normal range of motion. Neck supple.  Cardiovascular: Normal rate and regular rhythm.  Exam reveals no gallop and no friction rub.   No murmur heard. Pulmonary/Chest: Effort normal and breath sounds normal. No respiratory distress. He has no wheezes. He has no rales.  Abdominal: Soft. Bowel sounds are normal. He exhibits no distension. There is no tenderness.  Musculoskeletal: Normal range of motion. He exhibits no edema and no tenderness.  Upper and lower extremity strength and sensation normal.   Lymphadenopathy:    He has no cervical adenopathy.  Neurological: He is alert and oriented to person, place, and time. No cranial nerve deficit.  Skin: Skin is warm. He is not diaphoretic.  Psychiatric: He has a normal mood and affect. His behavior is normal.          Assessment & Plan:  Please see problem based assessment and plan.

## 2014-03-20 NOTE — Progress Notes (Signed)
INTERNAL MEDICINE TEACHING ATTENDING ADDENDUM - Collette Pescador, MD: I reviewed and discussed at the time of visit with the resident Dr. Wilson, the patient's medical history, physical examination, diagnosis and results of tests and treatment and I agree with the patient's care as documented.    

## 2014-03-29 ENCOUNTER — Encounter: Payer: Self-pay | Admitting: Internal Medicine

## 2014-03-29 ENCOUNTER — Ambulatory Visit (INDEPENDENT_AMBULATORY_CARE_PROVIDER_SITE_OTHER): Payer: No Typology Code available for payment source | Admitting: Internal Medicine

## 2014-03-29 VITALS — BP 147/83 | HR 86 | Temp 97.4°F | Ht 64.0 in | Wt 151.9 lb

## 2014-03-29 DIAGNOSIS — N179 Acute kidney failure, unspecified: Secondary | ICD-10-CM

## 2014-03-29 DIAGNOSIS — I10 Essential (primary) hypertension: Secondary | ICD-10-CM

## 2014-03-29 MED ORDER — METOPROLOL SUCCINATE ER 25 MG PO TB24: 25.0000 mg | ORAL_TABLET | Freq: Every day | ORAL | Status: DC

## 2014-03-29 NOTE — Progress Notes (Signed)
   Subjective:    Patient ID: Samuel Chen, male    DOB: 07/03/1956, 58 y.o.   MRN: [id number]  HPI Comments: Mr. Samuel Chen is a [age] year old male with a PMH of HTN, COPD Gold B, HLD.  I saw him 2 weeks ago for follow-up of BP.  Lisinopril had been stopped 2/2 to AKI.  BP was elevated at that time so amlodipine was started.  He feels his BP is higher with amlodipine.  He checks his BP at home and at the pharmacy.  BPs running 140-170s/80-90s.  Also with bitemporal headache 8/10 x 1 week he feels correlates to elevated BPs. He denies N/V, change in vision, fever, weakness, pre-syncope or syncope.       Review of Systems  Constitutional: Negative for chills and appetite change.  Eyes: Negative for visual disturbance.  Respiratory: Negative for shortness of breath.   Cardiovascular: Negative for chest pain, palpitations and leg swelling.  Gastrointestinal: Negative for diarrhea, constipation and blood in stool.  Genitourinary: Negative for dysuria, frequency and hematuria.  Neurological: Negative for syncope.       Objective:   Physical Exam  Vitals reviewed. Constitutional: He is oriented to person, place, and time. He appears well-developed. No distress.  HENT:  Head: Normocephalic and atraumatic.  Mouth/Throat: Oropharynx is clear and moist. No oropharyngeal exudate.  Temples and sinuses are non tender  Eyes: EOM are normal. Pupils are equal, round, and reactive to light.  Cardiovascular: Normal rate, regular rhythm and normal heart sounds.  Exam reveals no gallop and no friction rub.   No murmur heard. Pulmonary/Chest: Effort normal and breath sounds normal. No respiratory distress. He has no wheezes. He has no rales.  Abdominal: Soft. Bowel sounds are normal. He exhibits no distension. There is no tenderness.  Musculoskeletal: Normal range of motion. He exhibits edema. He exhibits no tenderness.  1+ B/L pretibial edema  Lymphadenopathy:    He has no cervical adenopathy.    Neurological: He is alert and oriented to person, place, and time. No cranial nerve deficit.  Skin: Skin is warm. He is not diaphoretic.  Psychiatric: He has a normal mood and affect. His behavior is normal.          Assessment & Plan:  Please see problem based assessment and plan.

## 2014-03-29 NOTE — Assessment & Plan Note (Addendum)
Cr improved to normal range but not at baseline.  He is taking 400mg  ibuprofen several nights per week for chronic pain.  I advised him to decrease frequency of NSAID use to avoid worsening Cr.

## 2014-03-29 NOTE — Assessment & Plan Note (Addendum)
BP Readings from Last 3 Encounters:  03/29/14 147/83  03/18/14 153/89  03/14/14 175/96    Lab Results  Component Value Date   NA 136 03/18/2014   K 4.1 03/18/2014   CREATININE 1.30 03/18/2014    Assessment: Blood pressure control:   slightly elevated today, but higher values at home Progress toward BP goal:   improving Comments: He reports elevated BP despite amlodipine and attributes headaches to BP.  Not the worst headache of his life.  Bitemporal distribution but no vision changes or temporal tenderness to suggest GCA.  No CNS deficits to suggest neuro cause.  No S&S of meningitis.  Plan: Medications:  STOP amlodipine. START Toprol XL 25mg  daily Educational resources provided: brochure Self management tools provided: home blood pressure logbook Other plans: Return to clinic on 08/13 for appointment with PCP and follow-up of BP  03/30/14 ADDENDUM:  Monotherapy with metoprolol will be unlikely to successfully lower BP so I called Samuel Chen and asked that he RESUME amlodipine and take Toprol XL in addition to it.  He agrees to this and to keep a log of his BP readings to bring to follow-up appointment on 08/13.

## 2014-03-29 NOTE — Patient Instructions (Signed)
1. Please STOP taking amlodipine.  START taking Toprol XL once daily for your blood pressure.  Please continue to check your blood pressure daily.  Return to clinic in 2 weeks for follow-up.   2. Please take all medications as prescribed.     3. If you have worsening of your symptoms or new symptoms arise, please call the clinic [phone]), or go to the ER immediately if symptoms are severe.  Please try to bring all your medicines next time. This helps us take good care of you and stops mistakes from medicines that could hurt you.

## 2014-03-30 ENCOUNTER — Telehealth: Payer: Self-pay | Admitting: Internal Medicine

## 2014-03-30 MED ORDER — AMLODIPINE BESYLATE 10 MG PO TABS: 10.0000 mg | ORAL_TABLET | Freq: Every day | ORAL | Status: DC

## 2014-03-30 NOTE — Telephone Encounter (Signed)
I called patient this morning and asked that he resume amlodipine in addition to startign Toprol XL daily.  He agrees.  He will continue checking BP and bring logs to visit in 2 weeks.

## 2014-03-30 NOTE — Progress Notes (Signed)
INTERNAL MEDICINE TEACHING ATTENDING ADDENDUM - Samuel LagosNischal Estle Huguley, MD: I reviewed and discussed at the time of visit with the resident Dr. Andrey CampanileWilson, the patient's medical history, physical examination, diagnosis and results of pertinent tests and treatment and I agree with the patient's care as documented with the following addition - Would have preferred patient stayed on amlodipine and added toprol to the regimen - Per resident patient wanted to stop the amlodipine. Would advise close BP monitoring at home and if persistently elevated would advise patient to [facility]

## 2014-03-30 NOTE — Addendum Note (Signed)
Addended by: Yolanda MangesWILSON, ALEX M on: 03/30/2014 11:28 AM   Modules accepted: Orders

## 2014-04-01 ENCOUNTER — Ambulatory Visit: Payer: No Typology Code available for payment source | Admitting: Internal Medicine

## 2014-04-14 ENCOUNTER — Ambulatory Visit (INDEPENDENT_AMBULATORY_CARE_PROVIDER_SITE_OTHER): Payer: No Typology Code available for payment source | Admitting: Internal Medicine

## 2014-04-14 ENCOUNTER — Encounter: Payer: Self-pay | Admitting: Internal Medicine

## 2014-04-14 VITALS — BP 147/90 | HR 88 | Temp 97.9°F | Wt 151.3 lb

## 2014-04-14 DIAGNOSIS — I10 Essential (primary) hypertension: Secondary | ICD-10-CM

## 2014-04-14 DIAGNOSIS — N179 Acute kidney failure, unspecified: Secondary | ICD-10-CM

## 2014-04-14 LAB — BASIC METABOLIC PANEL WITH GFR
BUN: 14 mg/dL (ref 6–23)
CHLORIDE: 102 meq/L (ref 96–112)
CO2: 24 mEq/L (ref 19–32)
CREATININE: 0.97 mg/dL (ref 0.50–1.35)
Calcium: 9.2 mg/dL (ref 8.4–10.5)
GFR, Est African American: >89 mL/min
GFR, Est Non African American: 88 mL/min
Glucose, Bld: 106 mg/dL — ABNORMAL HIGH (ref 70–99)
Potassium: 3.7 mEq/L (ref 3.5–5.3)
Sodium: 138 mEq/L (ref 135–145)

## 2014-04-14 LAB — CK: Total CK: 112 U/L (ref 7–232)

## 2014-04-14 MED ORDER — METOPROLOL SUCCINATE ER 50 MG PO TB24: 50.0000 mg | ORAL_TABLET | Freq: Every day | ORAL | Status: DC

## 2014-04-14 NOTE — Assessment & Plan Note (Addendum)
BP Readings from Last 3 Encounters:  04/14/14 147/90  03/29/14 147/83  03/18/14 153/89    Lab Results  Component Value Date   NA 136 03/18/2014   K 4.1 03/18/2014   CREATININE 1.30 03/18/2014    Assessment: Blood pressure control: mildly elevated Progress toward BP goal:  unchanged Comments: Pt taking the amlodipine and toprol XL. BP still elevated and pt now with BLE edema, which was a problem for him in the past on amlodipine. He denies any swelling of the lips or tongue or SOB.   Plan: Medications:  Stopping the Amlodipine. Given his h/o muscle cramping on diuretics (loop and thiazide), will increase the Toprol XL to 50mg  daily.  Other plans: F/u in 1 mo for BP recheck. If his BLE edema does not improve off the amlodipine, will need to evaluate for other causes of edema.

## 2014-04-14 NOTE — Progress Notes (Signed)
Patient ID: Samuel Chen, male   DOB: 04-27-1956, 58 y.o.   MRN: [id number]  Subjective:   Patient ID: Samuel Chen male   DOB: 04-27-1956 58 y.o.   MRN: [id number]  HPI: [name] is a 58 y.o. M w/ PMH HTN, COPD Gold B, HLD, and recent AKI presents for BP check.  His lisinopril was d/c'd in July due to AKI and hyperkalemia. He returned to the clinic after 2 weeks and his renal function had improved and K was normal, but his BP deteriorated. He was started on Toprol XL in addition to amlodipine. He was asked to keep a BP log and bring it with him to his visit today. His BP log values are primarily with SBP <140s and DBP 90 or less. However, he is c/o of swelling in his ankles that did occur while he was on amlodipine in the past.  He denies any fevers, chills, N/V, changes in vision, headaches, dizziness, numbness or tingling, or weakness.     Past Medical History  Diagnosis Date  . GERD (gastroesophageal reflux disease)   . Hypertension   . Hyperlipidemia   . COPD (chronic obstructive pulmonary disease)     Questionable diagnosis.  . 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 Myalgia 11/2007    Likely 2/2 Pravachol  . Dyshidrotic eczema   . Tobacco abuse   . Allergic rhinitis    Current Outpatient Prescriptions  Medication Sig Dispense Refill  . albuterol (PROVENTIL HFA;VENTOLIN HFA) 108 (90 BASE) MCG/ACT inhaler Inhale 2 puffs into the lungs every 6 (six) hours as needed for wheezing.  1 Inhaler  6  . clobetasol ointment (TEMOVATE) 0.05 % Apply topically 2 (two) times daily as needed. Apply to the itchy areas on legs twice a day for seven days  60 g  3  . esomeprazole (NEXIUM) 20 MG capsule Take 1 capsule (20 mg total) by mouth daily.  90 capsule  3  . Fluticasone-Salmeterol (ADVAIR) 250-50 MCG/DOSE AEPB Inhale 1 puff into the lungs 2 (two) times daily.  1 each  6  . hydrocortisone cream 1 % Apply to affected area 2-3 times daily  30 g  1  . metoprolol  succinate (TOPROL XL) 50 MG 24 hr tablet Take 1 tablet (50 mg total) by mouth daily.  30 tablet  2  . mometasone (NASONEX) 50 MCG/ACT nasal spray Place 2 sprays into the nose daily. One in each nostril  17 g  11  . pravastatin (PRAVACHOL) 40 MG tablet Take 1 tablet (40 mg total) by mouth daily.  30 tablet  1  . sildenafil (VIAGRA) 100 MG tablet Take 1 tablet (100 mg total) by mouth as needed for erectile dysfunction.  30 tablet  5   No current facility-administered medications for this visit.   Family History  Problem Relation Age of Onset  . Heart disease Sister   . Heart attack Sister     34-35yo  . Heart disease Father    History   Social History  . Marital Status: Single    Spouse Name: N/A    Number of Children: N/A  . Years of Education: N/A   Social History Main Topics  . Smoking status: Former Smoker -- 1.50 packs/day for 40 years    Types: Cigarettes    Start date: 06/03/1983    Quit date: 03/04/2013  . Smokeless tobacco: Never Used     Comment: Uses a Vapor.  .Marland Kitchen  Alcohol Use: Yes     Comment: Drink beer.  . Drug Use: No  . Sexual Activity: None   Other Topics Concern  . None   Social History Narrative   Financial assistance approved for 100% discount at [facility] and has [facility] card per [employer] October 13,2011 4:21PM   Review of Systems: A 12 point ROS was performed; pertinent positives and negatives were noted in the HPI   Objective:  Physical Exam: Filed Vitals:   04/14/14 1313 04/14/14 1333  BP: 156/90 147/90  Pulse: 83 88  Temp: 97.9 F (36.6 C)   TempSrc: Oral   Weight: 151 lb 4.8 oz (68.629 kg)   SpO2: 99%    Constitutional: Vital signs reviewed.  Patient is a well-developed and well-nourished male in no acute distress and cooperative with exam. Alert and oriented x3.  Head: Normocephalic and atraumatic Eyes: PERRL, EOMI. No scleral icterus.  Neck: Supple, Trachea midline, normal ROM, No JVD, mass, thyromegaly Cardiovascular: RRR, S1 normal, S2  normal, no MRG, pulses symmetric and intact bilaterally Pulmonary/Chest: normal respiratory effort, CTAB, no wheezes, rales, or rhonchi Abdominal: Soft. Non-tender, mild distension- unchanged, bowel sounds are normal, no masses, organomegaly, or guarding present.  Musculoskeletal: No joint deformities, erythema, or stiffness Ext: 1-2+ pitting edema of BLE, 2/3 up shin bilaterally Hematology: No cervical adenopathy.  Neurological: A&O x3, Strength is normal and symmetric bilaterally, cranial nerve II-XII are grossly intact, no focal motor deficit.  Skin: Warm, dry and intact.  Psychiatric: Normal mood and affect. speech and behavior is normal.   Assessment & Plan:   Please refer to Problem List based Assessment and Plan

## 2014-04-14 NOTE — Patient Instructions (Signed)
Stop taking the amlodipine and begin taking the Toprol XL 50mg  daily. Return to the clinic in 1 month. If you begin to have headaches, vision changes, dizziness, or are not feeling well over the next month, please call the clinic to be seen.   I am checking labs on you today to check your kidney function.   Keep working on cutting back your alcohol use.   General Instructions:   Please bring your medicines with you each time you come to clinic.  Medicines may include prescription medications, over-the-counter medications, herbal remedies, eye drops, vitamins, or other pills.   Progress Toward Treatment Goals:  Treatment Goal 03/03/2014  Blood pressure at goal  Stop smoking -    Self Care Goals & Plans:  Self Care Goal 03/29/2014  Manage my medications take my medicines as prescribed; bring my medications to every visit; refill my medications on time  Monitor my health -  Eat healthy foods drink diet soda or water instead of juice or soda; eat more vegetables; eat foods that are low in salt; eat baked foods instead of fried foods; eat fruit for snacks and desserts  Be physically active -  Stop smoking -    No flowsheet data found.   Care Management & Community Referrals:  Referral 07/08/2013  Referrals made for care management support none needed

## 2014-04-14 NOTE — Assessment & Plan Note (Signed)
Renal function improved after stopping ACEi. CK elevated, but improving over the past month, which could have been contributing to his AKI. Will repeat BMP and CK today.  - F/u 1 mo

## 2014-04-18 NOTE — Progress Notes (Signed)
Case discussed with Dr. Glenn soon after the resident saw the patient.  We reviewed the resident's history and exam and pertinent patient test results.  I agree with the assessment, diagnosis, and plan of care documented in the resident's note. 

## 2014-05-17 ENCOUNTER — Other Ambulatory Visit: Payer: Self-pay | Admitting: *Deleted

## 2014-05-17 DIAGNOSIS — E785 Hyperlipidemia, unspecified: Secondary | ICD-10-CM

## 2014-05-18 ENCOUNTER — Ambulatory Visit (INDEPENDENT_AMBULATORY_CARE_PROVIDER_SITE_OTHER): Payer: No Typology Code available for payment source | Admitting: Internal Medicine

## 2014-05-18 ENCOUNTER — Encounter: Payer: Self-pay | Admitting: Internal Medicine

## 2014-05-18 VITALS — BP 153/90 | HR 73 | Temp 98.0°F | Ht 64.0 in | Wt 150.9 lb

## 2014-05-18 DIAGNOSIS — Z23 Encounter for immunization: Secondary | ICD-10-CM

## 2014-05-18 DIAGNOSIS — I10 Essential (primary) hypertension: Secondary | ICD-10-CM

## 2014-05-18 DIAGNOSIS — Z0000 Encounter for general adult medical examination without abnormal findings: Secondary | ICD-10-CM

## 2014-05-18 DIAGNOSIS — N179 Acute kidney failure, unspecified: Secondary | ICD-10-CM

## 2014-05-18 MED ORDER — PRAVASTATIN SODIUM 40 MG PO TABS: 40.0000 mg | ORAL_TABLET | Freq: Every day | ORAL | Status: DC

## 2014-05-18 MED ORDER — METOPROLOL SUCCINATE ER 100 MG PO TB24: 100.0000 mg | ORAL_TABLET | Freq: Every day | ORAL | Status: DC

## 2014-05-18 NOTE — Patient Instructions (Signed)
We are increasing your Toprol XL to  daily.   General Instructions:   Please bring your medicines with you each time you come to clinic.  Medicines may include prescription medications, over-the-counter medications, herbal remedies, eye drops, vitamins, or other pills.   Progress Toward Treatment Goals:  Treatment Goal 05/18/2014  Blood pressure deteriorated  Stop smoking -    Self Care Goals & Plans:  Self Care Goal 05/18/2014  Manage my medications take my medicines as prescribed; bring my medications to every visit; refill my medications on time; follow the sick day instructions if I am sick  Monitor my health keep track of my blood pressure  Eat healthy foods eat more vegetables; eat fruit for snacks and desserts; eat baked foods instead of fried foods; eat smaller portions  Be physically active find an activity I enjoy  Stop smoking -    No flowsheet data found.   Care Management & Community Referrals:  Referral 07/08/2013  Referrals made for care management support none needed

## 2014-05-18 NOTE — Assessment & Plan Note (Signed)
BP Readings from Last 3 Encounters:  05/18/14 153/90  04/14/14 147/90  03/29/14 147/83    Lab Results  Component Value Date   NA 138 04/14/2014   K 3.7 04/14/2014   CREATININE 0.97 04/14/2014    Assessment: Blood pressure control: moderately elevated Progress toward BP goal:  deteriorated Comments: Taking Toprol XL  daily. Does not tolerated Amlodipine (peripheral edema), diuretics (severe muscle craps), or ACEi (AKI).  Plan: Medications:  Increasing to Toprol XL  daily. Educational resources provided: Comptroller tools provided:   Other plans: F/u in 2 months for BP check.

## 2014-05-18 NOTE — Progress Notes (Signed)
Rx for Pravastatin called in to pharmacy. Stanton Kidney Alicja Everitt RN 05/18/14 4:15PM

## 2014-05-18 NOTE — Assessment & Plan Note (Signed)
Cr and CK normalized. No further labs needed at this time.

## 2014-05-18 NOTE — Progress Notes (Signed)
INTERNAL MEDICINE TEACHING ATTENDING ADDENDUM - Brier Firebaugh, MD: I reviewed and discussed at the time of visit with the resident Dr. Glenn, the patient's medical history, physical examination, diagnosis and results of pertinent tests and treatment and I agree with the patient's care as documented.  

## 2014-05-18 NOTE — Progress Notes (Signed)
Patient ID: Samuel Chen, male   DOB: 19-Nov-1955, 58 y.o.   MRN: [id number]  Subjective:   Patient ID: Samuel Chen male   DOB: August 06, 1956 58 y.o.   MRN: [id number]  HPI: Samuel Chen is a 58 y.o. M w/ PMH HTN, COPD Gold B, HLD, and recent AKI presents for BP check.  He was seen 8/13 with peripheral edema form amlodipine and an elevated CK and Cr. The amlodipine was stopped and his Toprol Xl was increased to  daily. The Cr had improved after stopping the ACEi a few months ago. Unsure of why he had the CK elevation, which is improving, as his statin dose has remained unchanged. The elevated CK was also likely contributing to his AKI.   He has been checking his BP at home and his numbers have been w/ SBPs 120-130 but occasionally in the 160s.   He is feeling good today w/o complaints. He denies headaches, vision changes, dizziness, numbness or tingling, SOB, chest pain, nausea, vomiting, diarrhea, constipation, abd pain, changes in urination or constipation.    Past Medical History  Diagnosis Date  . GERD (gastroesophageal reflux disease)   . Hypertension   . Hyperlipidemia   . COPD (chronic obstructive pulmonary disease)     Questionable diagnosis.  . 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 Myalgia 11/2007    Likely 2/2 Pravachol  . Dyshidrotic eczema   . Tobacco abuse   . Allergic rhinitis    Current Outpatient Prescriptions  Medication Sig Dispense Refill  . albuterol (PROVENTIL HFA;VENTOLIN HFA) 108 (90 BASE) MCG/ACT inhaler Inhale 2 puffs into the lungs every 6 (six) hours as needed for wheezing.  1 Inhaler  6  . clobetasol ointment (TEMOVATE) 0.05 % Apply topically 2 (two) times daily as needed. Apply to the itchy areas on legs twice a day for seven days  60 g  3  . esomeprazole (NEXIUM) 20 MG capsule Take 1 capsule (20 mg total) by mouth daily.  90 capsule  3  . Fluticasone-Salmeterol (ADVAIR) 250-50 MCG/DOSE AEPB Inhale 1 puff into the lungs 2  (two) times daily.  1 each  6  . hydrocortisone cream 1 % Apply to affected area 2-3 times daily  30 g  1  . metoprolol succinate (TOPROL XL) 50 MG 24 hr tablet Take 1 tablet (50 mg total) by mouth daily.  30 tablet  2  . mometasone (NASONEX) 50 MCG/ACT nasal spray Place 2 sprays into the nose daily. One in each nostril  17 g  11  . pravastatin (PRAVACHOL) 40 MG tablet Take 1 tablet (40 mg total) by mouth daily.  30 tablet  1  . sildenafil (VIAGRA) 100 MG tablet Take 1 tablet (100 mg total) by mouth as needed for erectile dysfunction.  30 tablet  5   No current facility-administered medications for this visit.   Family History  Problem Relation Age of Onset  . Heart disease Sister   . Heart attack Sister     34-35yo  . Heart disease Father    History   Social History  . Marital Status: Single    Spouse Name: N/A    Number of Children: N/A  . Years of Education: N/A   Social History Main Topics  . Smoking status: Former Smoker -- 1.50 packs/day for 40 years    Types: Cigarettes    Start date: 06/03/1983    Quit date: 03/04/2013  . Smokeless tobacco: Never  Used     Comment: Uses a Vapor.  . Alcohol Use: Yes     Comment: Drink beer.  . Drug Use: No  . Sexual Activity: None   Other Topics Concern  . None   Social History Narrative   Financial assistance approved for 100% discount at [facility] and has [facility] card per [employer] October 13,2011 4:21PM   Review of Systems: A 12 point ROS was performed; pertinent positives and negatives were noted in the HPI   Objective:  Physical Exam: Filed Vitals:   05/18/14 1330  BP: 153/90  Pulse: 73  Temp: 98 F (36.7 C)  TempSrc: Oral  Height:  (1.626 m)  Weight: 150 lb 14.4 oz (68.448 kg)  SpO2: 100%   Constitutional: Vital signs reviewed.  Patient is a well-developed and well-nourished male in no acute distress and cooperative with exam. Alert and oriented x3.  Head: Normocephalic and atraumatic Mouth: MMM Eyes: PERRL,  EOMI. No scleral icterus.  Neck: Supple, Trachea midline, normal ROM, No JVD Cardiovascular: RRR, no MRG, pulses symmetric and intact bilaterally Pulmonary/Chest: Normal respiratory effort, CTAB, no wheezes, rales, or rhonchi Abdominal: Soft. Non-tender, non-distended, bowel sounds are normal Musculoskeletal: ROM full, moving all 4 extremities.  Neurological: A&O x3, Strength is normal and symmetric bilaterally, cranial nerve II-XII are grossly intact, no focal deficits.  Skin: Warm, dry and intact.  Psychiatric: Normal mood and affect. speech and behavior is normal.    Assessment & Plan:   Please refer to Problem List based Assessment and Plan

## 2014-05-18 NOTE — Telephone Encounter (Signed)
Rx called in to pharmacy. 

## 2014-07-04 ENCOUNTER — Ambulatory Visit (INDEPENDENT_AMBULATORY_CARE_PROVIDER_SITE_OTHER): Payer: No Typology Code available for payment source | Admitting: Sports Medicine

## 2014-07-04 ENCOUNTER — Encounter: Payer: Self-pay | Admitting: Sports Medicine

## 2014-07-04 VITALS — BP 174/83 | Ht 64.0 in | Wt 140.0 lb

## 2014-07-04 DIAGNOSIS — M25511 Pain in right shoulder: Secondary | ICD-10-CM

## 2014-07-04 DIAGNOSIS — M75101 Unspecified rotator cuff tear or rupture of right shoulder, not specified as traumatic: Secondary | ICD-10-CM

## 2014-07-04 DIAGNOSIS — M25512 Pain in left shoulder: Secondary | ICD-10-CM

## 2014-07-04 DIAGNOSIS — M75102 Unspecified rotator cuff tear or rupture of left shoulder, not specified as traumatic: Secondary | ICD-10-CM

## 2014-07-04 MED ORDER — METHYLPREDNISOLONE ACETATE 40 MG/ML IJ SUSP
40.0000 mg | Freq: Once | INTRAMUSCULAR | Status: AC
  Administered 2014-07-04: 40 mg via INTRA_ARTICULAR

## 2014-07-04 MED ORDER — METHYLPREDNISOLONE ACETATE 40 MG/ML IJ SUSP
40.0000 mg | Freq: Once | INTRAMUSCULAR | Status: AC
Start: 2014-07-04 — End: 2014-07-04
  Administered 2014-07-04: 40 mg via INTRA_ARTICULAR

## 2014-07-04 NOTE — Progress Notes (Signed)
   Subjective:    Patient ID: Samuel Chen, male    DOB: 1956-04-25, 58 y.o.   MRN: [id number]  HPI: Patient is a 58yo right-hand-dominant male who presents to clinic for recurrent bilateral shoulder pain. He was last seen in July and received a left subacromial injection, which helped relieve his pain for about 2-3 months. His pain has returned and has been present 2-3 weeks, worse than previously. He reports shoulder aching soreness and painful ROM, left greater than right, especially at night, which interferes with his sleep. He has taken Mobic in the past, but his PCP stopped it about 3 months ago, and he has been using ibuprofen up to 800 mg per day, with little to no relief. He requests repeat cortisone injections, if possible. He denies recent trauma or new radiculopathy-type symptoms.  Review of Systems: As above. Feels well, otherwise.     Objective:   Physical Exam BP 174/83 mmHg  Ht 5\' 4"  (1.626 m)  Wt 140 lb (63.504 kg)  BMI 24.02 kg/m2 Gen: well-appearing adult male in NAD  MSK: bilateral shoulders normal to inspection  Marked tenderness to bilateral shoulders diffusely over bony prominences, left slightly worse than right, especially over AC joint  ROM markedly reduced due to pain in forward flexion and abduction of the left shoulder; unable to place hand behind head  ROM of right shoulder similar but less marked; able to place hand behind head and at small of back on the right  Pain reproduced exquisitely with resisted internal and external rotation of bilateral shoulders  Positive empty can, Neer impingement, and Hawkins testing bilaterally  Equivocal O'Brien test on left (painful with thumb directed downward, but no relief with supination)  Neurovascular: alert, oriented, sensation of bilateral hands grossly intact  Strength 4+/5 bilaterally in shoulder movements with MSK testing as above, secondary to pain  Grip strength 5/5 bilaterally  Normal capillary refill to  both hands     Assessment & Plan:  58yo male with bilateral shoulder pain consistent with rotator cuff syndrome - repeat shoulder injection on left and injection on right performed, today (see below) - plan f/u as needed - if steroid injections do not relieve pain, or if they do not last at least 2-3 months, will likely pursue repeat imaging  Left Subacromial Injection Consent obtained and verified. Skin cleansed with alcohol. Topical analgesic spray: Ethyl chloride. Joint: left subacromial space Approached in typical fashion. Completed without difficulty and no immediate complications. Meds: 3 mL 1% Xylocaine, 1 mL (40 mg) Depo-Medrol Needle: 25g, 1.5" Covered with sterile bandage. Aftercare instructions and red flags discussed.  Right Subacromial Injection Consent obtained and verified. Skin cleansed with alcohol. Topical analgesic spray: Ethyl chloride. Joint: right subacromial space Approached in typical fashion. Completed without difficulty and no immediate complications. Meds: 3 mL 1% Xylocaine, 1 mL (40 mg) Depo-Medrol Needle: 25g, 1.5" Covered with sterile bandage. Aftercare instructions and red flags discussed.

## 2014-07-14 ENCOUNTER — Ambulatory Visit (INDEPENDENT_AMBULATORY_CARE_PROVIDER_SITE_OTHER): Payer: Self-pay | Admitting: Internal Medicine

## 2014-07-14 ENCOUNTER — Ambulatory Visit: Payer: No Typology Code available for payment source

## 2014-07-14 ENCOUNTER — Encounter: Payer: Self-pay | Admitting: Internal Medicine

## 2014-07-14 VITALS — BP 169/91 | HR 64 | Temp 97.7°F | Resp 20 | Ht 64.0 in | Wt 148.6 lb

## 2014-07-14 DIAGNOSIS — I10 Essential (primary) hypertension: Secondary | ICD-10-CM

## 2014-07-14 MED ORDER — METOPROLOL SUCCINATE ER 200 MG PO TB24: 200.0000 mg | ORAL_TABLET | Freq: Every day | ORAL | Status: DC

## 2014-07-14 NOTE — Progress Notes (Signed)
Patient ID: Shaune Pascalhillip K Pinnix, male   DOB: [birth year], 58 y.o.   MRN: [id number]  Subjective:   Patient ID: Margarita Mailhillip K Liese male   DOB: [birth year] 58 y.o.   MRN: [id number]  HPI: Mr.Yandriel K Fluegge is a 58 y.o. M w/ PMH HTN, COPD Gold B, and HLD presents for a blood pressure recheck.   He was last seen in Sept and at that time his Toprol XL was increased due to uncontrolled HTN. He does not tolerate Amlodipine (peripheral edema), diuretics (severe muscle craps), or ACEi (AKI). Today he states that his BP has been elevated and he is having pressure headaches in his forehead that are lasting approx 1hr and resolve on their own.   He was seen by Sports Medicine on 11/2 c/o bilateral shoulder pain. He has received steroid injections in the past with good results, and repeating injections were performed at that visit. His pain is improved since the injections.   He denies fevers, chills, weight loss, vision changes, dizziness, SOB, chest pain, abd pain, N/V/D, difficulty urinating, numbness, weakness, or trouble with ambulation.   Past Medical History  Diagnosis Date  . GERD (gastroesophageal reflux disease)   . Hypertension   . Hyperlipidemia   . COPD (chronic obstructive pulmonary disease)     Questionable diagnosis.  . 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 Myalgia 11/2007    Likely 2/2 Pravachol  . Dyshidrotic eczema   . Tobacco abuse   . Allergic rhinitis    Current Outpatient Prescriptions  Medication Sig Dispense Refill  . albuterol (PROVENTIL HFA;VENTOLIN HFA) 108 (90 BASE) MCG/ACT inhaler Inhale 2 puffs into the lungs every 6 (six) hours as needed for wheezing. 1 Inhaler 6  . clobetasol ointment (TEMOVATE) 0.05 % Apply topically 2 (two) times daily as needed. Apply to the itchy areas on legs twice a day for seven days 60 g 3  . esomeprazole (NEXIUM) 20 MG capsule Take 1 capsule (20 mg total) by mouth daily. 90 capsule 3  . Fluticasone-Salmeterol (ADVAIR) 250-50  MCG/DOSE AEPB Inhale 1 puff into the lungs 2 (two) times daily. 1 each 6  . hydrocortisone cream 1 % Apply to affected area 2-3 times daily 30 g 1  . metoprolol succinate (TOPROL-XL) 200 MG 24 hr tablet Take 1 tablet (200 mg total) by mouth daily. 30 tablet 6  . mometasone (NASONEX) 50 MCG/ACT nasal spray Place 2 sprays into the nose daily. One in each nostril 17 g 11  . pravastatin (PRAVACHOL) 40 MG tablet Take 1 tablet (40 mg total) by mouth daily. 30 tablet 6  . sildenafil (VIAGRA) 100 MG tablet Take 1 tablet (100 mg total) by mouth as needed for erectile dysfunction. 30 tablet 5   No current facility-administered medications for this visit.   Family History  Problem Relation Age of Onset  . Heart disease Sister   . Heart attack Sister     34-35yo  . Heart disease Father    History   Social History  . Marital Status: Single    Spouse Name: N/A    Number of Children: N/A  . Years of Education: N/A   Social History Main Topics  . Smoking status: Former Smoker -- 1.50 packs/day for 40 years    Types: Cigarettes    Start date: 06/03/1983    Quit date: 03/04/2013  . Smokeless tobacco: Never Used     Comment: Uses a Vapor.  . Alcohol Use: Yes  Comment: Drink beer.  . Drug Use: No  . Sexual Activity: None   Other Topics Concern  . None   Social History Narrative   [facility] and [facility] per Xcel EnergyDeborah Hill October 13,2011 4:21PM   Review of Systems: A 12 point ROS was performed; pertinent positives and negatives were noted in the HPI   Objective:  Physical Exam: Filed Vitals:   07/14/14 0902 07/14/14 0915 07/14/14 0927  BP: 169/83 170/83 169/91  Pulse: 64    Temp: 97.7 F (36.5 C)    TempSrc: Oral    Resp: 20    Height: 5\' 4"  (1.626 m)    Weight: 148 lb 9.6 oz (67.405 kg)    SpO2: 100%     Constitutional: Vital signs reviewed.  Patient is a well-developed and well-nourished male in no acute distress and  cooperative with exam. Alert and oriented x3.  Head: Normocephalic and atraumatic Eyes: PERRL, EOMI. No scleral icterus.  Cardiovascular: RRR Pulmonary/Chest: Normal respiratory effort, Abdominal: Soft. Non-distended Musculoskeletal: No joint deformities Neurological: A&O x3, cranial nerve II-XII are grossly intact, no focal motor deficit.  Skin: Warm, dry and intact.  Psychiatric: Normal mood and affect. Speech and behavior is normal.  Assessment & Plan:    Please refer to Problem List based Assessment and Plan

## 2014-07-14 NOTE — Patient Instructions (Signed)
Begin taking Toprol XL 200mg  daily. Return to the clinic in 1 month.  General Instructions:   Please bring your medicines with you each time you come to clinic.  Medicines may include prescription medications, over-the-counter medications, herbal remedies, eye drops, vitamins, or other pills.   Progress Toward Treatment Goals:  Treatment Goal 05/18/2014  Blood pressure deteriorated  Stop smoking -    Self Care Goals & Plans:  Self Care Goal 05/18/2014  Manage my medications take my medicines as prescribed; bring my medications to every visit; refill my medications on time; follow the sick day instructions if I am sick  Monitor my health keep track of my blood pressure  Eat healthy foods eat more vegetables; eat fruit for snacks and desserts; eat baked foods instead of fried foods; eat smaller portions  Be physically active find an activity I enjoy  Stop smoking -    No flowsheet data found.   Care Management & Community Referrals:  Referral 07/08/2013  Referrals made for care management support none needed

## 2014-07-14 NOTE — Assessment & Plan Note (Signed)
BP Readings from Last 3 Encounters:  07/14/14 169/91  07/04/14 174/83  05/18/14 153/90    Lab Results  Component Value Date   NA 138 04/14/2014   K 3.7 04/14/2014   CREATININE 0.97 04/14/2014    Assessment: Blood pressure control: moderately elevated Progress toward BP goal:  Unchanged Comments: BP only slightly improved from last clinic  Plan: Medications:  Pt has not tolerated CCB, ACEi, or diuretics in the past. He does not want to start a mediciation that he will have to take multiple times a day. For now, will increase the Toprol XL to 200mg  daily.  Educational resources provided: brochure Self management tools provided: home blood pressure logbook Other plans: F/u in 1 month

## 2014-07-15 NOTE — Progress Notes (Signed)
Internal Medicine Clinic Attending  Case discussed with Dr. Glenn soon after the resident saw the patient.  We reviewed the resident's history and exam and pertinent patient test results.  I agree with the assessment, diagnosis, and plan of care documented in the resident's note. 

## 2014-08-11 ENCOUNTER — Encounter: Payer: Self-pay | Admitting: Internal Medicine

## 2014-09-15 ENCOUNTER — Encounter: Payer: Self-pay | Admitting: Internal Medicine

## 2014-11-03 ENCOUNTER — Ambulatory Visit (INDEPENDENT_AMBULATORY_CARE_PROVIDER_SITE_OTHER): Payer: Self-pay | Admitting: Internal Medicine

## 2014-11-03 ENCOUNTER — Encounter: Payer: Self-pay | Admitting: Internal Medicine

## 2014-11-03 VITALS — BP 164/84 | HR 68 | Temp 98.3°F | Ht 64.0 in | Wt 156.3 lb

## 2014-11-03 DIAGNOSIS — E785 Hyperlipidemia, unspecified: Secondary | ICD-10-CM

## 2014-11-03 DIAGNOSIS — J302 Other seasonal allergic rhinitis: Secondary | ICD-10-CM

## 2014-11-03 DIAGNOSIS — I10 Essential (primary) hypertension: Secondary | ICD-10-CM

## 2014-11-03 DIAGNOSIS — J449 Chronic obstructive pulmonary disease, unspecified: Secondary | ICD-10-CM

## 2014-11-03 MED ORDER — MOMETASONE FUROATE 50 MCG/ACT NA SUSP: 2.0000 | Freq: Every day | NASAL | Status: DC

## 2014-11-03 MED ORDER — PRAVASTATIN SODIUM 40 MG PO TABS: 40.0000 mg | ORAL_TABLET | Freq: Every day | ORAL | Status: DC

## 2014-11-03 MED ORDER — HYDRALAZINE HCL 10 MG PO TABS: 10.0000 mg | ORAL_TABLET | Freq: Three times a day (TID) | ORAL | Status: DC

## 2014-11-03 MED ORDER — METOPROLOL SUCCINATE ER 200 MG PO TB24: 200.0000 mg | ORAL_TABLET | Freq: Every day | ORAL | Status: DC

## 2014-11-03 MED ORDER — ESOMEPRAZOLE MAGNESIUM 20 MG PO CPDR: 20.0000 mg | DELAYED_RELEASE_CAPSULE | Freq: Every day | ORAL | Status: DC

## 2014-11-03 MED ORDER — FLUTICASONE-SALMETEROL 250-50 MCG/DOSE IN AEPB: 1.0000 | INHALATION_SPRAY | Freq: Two times a day (BID) | RESPIRATORY_TRACT | Status: DC

## 2014-11-03 MED ORDER — ALBUTEROL SULFATE HFA 108 (90 BASE) MCG/ACT IN AERS: 2.0000 | INHALATION_SPRAY | Freq: Four times a day (QID) | RESPIRATORY_TRACT | Status: DC | PRN

## 2014-11-03 MED ORDER — DESLORATADINE 5 MG PO TABS: 5.0000 mg | ORAL_TABLET | Freq: Every day | ORAL | Status: DC

## 2014-11-03 MED ORDER — HYDRALAZINE HCL 10 MG PO TABS: 25.0000 mg | ORAL_TABLET | Freq: Three times a day (TID) | ORAL | Status: DC

## 2014-11-03 NOTE — Patient Instructions (Signed)
Begin taking the Hydralazine 10mg  (1 tablet) 3 times a day.  You can take the Clarinex once a day for your allergies.  Return to the clinic in 2 weeks for a blood pressure recheck.   Hydralazine tablets What is this medicine? HYDRALAZINE (hye DRAL a zeen) is a type of vasodilator. It relaxes blood vessels, increasing the blood and oxygen supply to your heart. This medicine is used to treat high blood pressure. This medicine may be used for other purposes; ask your health care provider or pharmacist if you have questions. COMMON BRAND NAME(S): Apresoline What should I tell my health care provider before I take this medicine? They need to know if you have any of these conditions: -blood vessel disease -heart disease including angina or history of heart attack -kidney or liver disease -systemic lupus erythematosus (SLE) -an unusual or allergic reaction to hydralazine, tartrazine dye, other medicines, foods, dyes, or preservatives -pregnant or trying to get pregnant -breast-feeding How should I use this medicine? Take this medicine by mouth with a glass of water. Follow the directions on the prescription label. Take your doses at regular intervals. Do not take your medicine more often than directed. Do not stop taking except on the advice of your doctor or health care professional. Talk to your pediatrician regarding the use of this medicine in children. Special care may be needed. While this drug may be prescribed for children for selected conditions, precautions do apply. Overdosage: If you think you have taken too much of this medicine contact a poison control center or emergency room at once. NOTE: This medicine is only for you. Do not share this medicine with others. What if I miss a dose? If you miss a dose, take it as soon as you can. If it is almost time for your next dose, take only that dose. Do not take double or extra doses. What may interact with this medicine? -medicines for  high blood pressure -medicines for mental depression This list may not describe all possible interactions. Give your health care provider a list of all the medicines, herbs, non-prescription drugs, or dietary supplements you use. Also tell them if you smoke, drink alcohol, or use illegal drugs. Some items may interact with your medicine. What should I watch for while using this medicine? Visit your doctor or health care professional for regular checks on your progress. Check your blood pressure and pulse rate regularly. Ask your doctor or health care professional what your blood pressure and pulse rate should be and when you should contact him or her. You may get drowsy or dizzy. Do not drive, use machinery, or do anything that needs mental alertness until you know how this medicine affects you. Do not stand or sit up quickly, especially if you are an older patient. This reduces the risk of dizzy or fainting spells. Alcohol may interfere with the effect of this medicine. Avoid alcoholic drinks. Do not treat yourself for coughs, colds, or pain while you are taking this medicine without asking your doctor or health care professional for advice. Some ingredients may increase your blood pressure. What side effects may I notice from receiving this medicine? Side effects that you should report to your doctor or health care professional as soon as possible: -chest pain, or fast or irregular heartbeat -fever, chills, or sore throat -numbness or tingling in the hands or feet -shortness of breath -skin rash, redness, blisters or itching -stiff or swollen joints -sudden weight gain -swelling of the feet or  legs -swollen lymph glands -unusual weakness Side effects that usually do not require medical attention (report to your doctor or health care professional if they continue or are bothersome): -diarrhea, or constipation -headache -loss of appetite -nausea, vomiting This list may not describe all  possible side effects. Call your doctor for medical advice about side effects. You may report side effects to FDA at 1-800-FDA-1088. Where should I keep my medicine? Keep out of the reach of children. Store at room temperature between 15 and 30 degrees C (59 and 86 degrees F). Throw away any unused medicine after the expiration date. NOTE: This sheet is a summary. It may not cover all possible information. If you have questions about this medicine, talk to your doctor, pharmacist, or health care provider.  2015, Elsevier/Gold Standard. (2008-01-01 15:44:58)  General Instructions:   Please bring your medicines with you each time you come to clinic.  Medicines may include prescription medications, over-the-counter medications, herbal remedies, eye drops, vitamins, or other pills.   Progress Toward Treatment Goals:  Treatment Goal 07/14/2014  Blood pressure deteriorated  Stop smoking -    Self Care Goals & Plans:  Self Care Goal 11/03/2014  Manage my medications take my medicines as prescribed; bring my medications to every visit; refill my medications on time; follow the sick day instructions if I am sick  Monitor my health keep track of my blood pressure  Eat healthy foods eat fruit for snacks and desserts; eat more vegetables; eat baked foods instead of fried foods; eat foods that are low in salt  Be physically active find an activity I enjoy  Stop smoking -    No flowsheet data found.   Care Management & Community Referrals:  Referral 07/14/2014  Referrals made for care management support -  Referrals made to community resources smoking cessation

## 2014-11-03 NOTE — Progress Notes (Signed)
[facility] per Dr Sherrine MaplesGlenn. Stanton KidneyDebra Markhi Kleckner RN 11/03/14 4:10PM

## 2014-11-03 NOTE — Progress Notes (Addendum)
Patient ID: Samuel Chen, male   DOB: 02/22/1956, 59 y.o.   MRN: [id number]  Subjective:   Patient ID: Samuel Chen male   DOB: 02/22/1956 59 y.o.   MRN: [id number]  HPI: [name] is a 59 y.o. M w/ PMH HTN, COPD Gold B, and HLD presents for a blood pressure check.   Pt has not tolerated CCB, ACEi, or diuretics in the past. His Toprol XL was increased to 200mg  daily at his last visit in November. He was not interested in starting a TID medication at that time. He endorses continued intermittent headaches and states that his SBP has been running in the 160-170 at home. He is still having intermittent headaches.    Past Medical History  Diagnosis Date  . GERD (gastroesophageal reflux disease)   . Hypertension   . Hyperlipidemia   . COPD (chronic obstructive pulmonary disease)     Questionable diagnosis.  . 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 Myalgia 11/2007    Likely 2/2 Pravachol  . Dyshidrotic eczema   . Tobacco abuse   . Allergic rhinitis    Current Outpatient Prescriptions  Medication Sig Dispense Refill  . albuterol (PROVENTIL HFA;VENTOLIN HFA) 108 (90 BASE) MCG/ACT inhaler Inhale 2 puffs into the lungs every 6 (six) hours as needed for wheezing. 1 Inhaler 6  . clobetasol ointment (TEMOVATE) 0.05 % Apply topically 2 (two) times daily as needed. Apply to the itchy areas on legs twice a day for seven days 60 g 3  . esomeprazole (NEXIUM) 20 MG capsule Take 1 capsule (20 mg total) by mouth daily. 90 capsule 3  . Fluticasone-Salmeterol (ADVAIR) 250-50 MCG/DOSE AEPB Inhale 1 puff into the lungs 2 (two) times daily. 1 each 6  . hydrocortisone cream 1 % Apply to affected area 2-3 times daily 30 g 1  . metoprolol succinate (TOPROL-XL) 200 MG 24 hr tablet Take 1 tablet (200 mg total) by mouth daily. 30 tablet 6  . mometasone (NASONEX) 50 MCG/ACT nasal spray Place 2 sprays into the nose daily. One in each nostril 17 g 11  . pravastatin (PRAVACHOL) 40 MG  tablet Take 1 tablet (40 mg total) by mouth daily. 30 tablet 6  . sildenafil (VIAGRA) 100 MG tablet Take 1 tablet (100 mg total) by mouth as needed for erectile dysfunction. 30 tablet 5   No current facility-administered medications for this visit.   Family History  Problem Relation Age of Onset  . Heart disease Sister   . Heart attack Sister     34-35yo  . Heart disease Father    History   Social History  . Marital Status: Single    Spouse Name: N/A  . Number of Children: N/A  . Years of Education: N/A   Social History Main Topics  . Smoking status: Former Smoker -- 1.50 packs/day for 40 years    Types: Cigarettes    Start date: 06/03/1983    Quit date: 03/04/2013  . Smokeless tobacco: Never Used     Comment: Uses a Vapor.  . Alcohol Use: Yes     Comment: Drink beer.  . Drug Use: No  . Sexual Activity: Not on file   Other Topics Concern  . None   Social History Narrative   [facility] and [facility] per Rudell Cobbeborah Hill October 13,2011 4:21PM   Review of Systems: +headaches. He denies fevers, chills, weight loss, vision changes, dizziness,  SOB, chest pain, abd pain, N/V/D, difficulty urinating, numbness, weakness, or trouble with ambulation.   Objective:  Physical Exam: Filed Vitals:   11/03/14 1451  BP: 164/84  Pulse: 68  Temp: 98.3 F (36.8 C)  TempSrc: Oral  Height:  (1.626 m)  Weight: 156 lb 4.8 oz (70.897 kg)  SpO2: 99%   Constitutional: Vital signs reviewed.  Patient is a well-developed and well-nourished male in no acute distress and cooperative with exam. Alert and oriented x3.  Head: Normocephalic and atraumatic Nose: No significant drainage noted.   Mouth: MMM. Poor dentition Eyes: PERRL, EOMI, no scleral icterus.  Neck: Normal ROM Cardiovascular: RRR, no MRG, pulses symmetric and intact bilaterally Pulmonary/Chest: Normal respiratory effort, CTAB, no wheezes, rales, or rhonchi Abdominal: Soft.  Non-tender, non-distended, bowel sounds are normal.  Musculoskeletal: No joint deformities or stiffness Neurological: A&O x3, cranial nerve II-XII are grossly intact, no focal motor deficit.  Skin: Warm, dry and intact.  Psychiatric: Normal mood and affect. Speech and behavior is normal.   Assessment & Plan:   Please refer to Problem List based Assessment and Plan

## 2014-11-03 NOTE — Assessment & Plan Note (Signed)
BP Readings from Last 3 Encounters:  11/03/14 164/84  07/14/14 169/91  07/04/14 174/83    Lab Results  Component Value Date   NA 138 04/14/2014   K 3.7 04/14/2014   CREATININE 0.97 04/14/2014    Assessment: Blood pressure control: moderately elevated Progress toward BP goal:  unchanged   Plan: Medications:  continue current medications: Toprol XL 200mg  daily, begin hydralazine 10mg  po TID Educational resources provided: brochure, handout, video Self management tools provided:   Other plans: F/u in 2 weeks for BP recheck

## 2014-11-03 NOTE — Progress Notes (Signed)
Medicine attending: Medical history, presenting problems, physical findings, and medications, reviewed with Dr Kathryn Glenn and I concur with her evaluation and management plan. 

## 2014-11-03 NOTE — Assessment & Plan Note (Signed)
Stable. Refilled Advair today

## 2014-11-03 NOTE — Assessment & Plan Note (Signed)
Pt with dry cough and nasal congestion for 3 weeks. Lungs are clear nasal exam unremarkable. H/o allergies; suspect symptoms are allergic.  - Pt advised to continue Nasonex - prescribing Clarinex 5mg  po daily.

## 2014-11-17 ENCOUNTER — Ambulatory Visit: Payer: Self-pay | Admitting: Internal Medicine

## 2014-12-06 ENCOUNTER — Telehealth: Payer: Self-pay | Admitting: *Deleted

## 2014-12-06 NOTE — Telephone Encounter (Signed)
[facility]ium is no longer [facility], would you consider changing to dexilant- which is free? If so please do a new script

## 2014-12-06 NOTE — Telephone Encounter (Signed)
Also crestor is free, would you consider changing the pravastatin to crestor?

## 2014-12-12 ENCOUNTER — Other Ambulatory Visit: Payer: Self-pay | Admitting: *Deleted

## 2014-12-12 DIAGNOSIS — I10 Essential (primary) hypertension: Secondary | ICD-10-CM

## 2014-12-13 NOTE — Telephone Encounter (Signed)
Will see if Dr. Sherrine MaplesGlenn is available first and let her handle refills.  If she is not available by this afternoon, clinic attending will address.  Thanks

## 2014-12-14 ENCOUNTER — Other Ambulatory Visit: Payer: Self-pay | Admitting: Internal Medicine

## 2014-12-14 MED ORDER — ROSUVASTATIN CALCIUM 10 MG PO TABS: 10.0000 mg | ORAL_TABLET | Freq: Every day | ORAL | Status: DC

## 2014-12-14 MED ORDER — DEXLANSOPRAZOLE 30 MG PO CPDR: 30.0000 mg | DELAYED_RELEASE_CAPSULE | Freq: Every day | ORAL | Status: DC

## 2014-12-14 MED ORDER — HYDRALAZINE HCL 10 MG PO TABS: 10.0000 mg | ORAL_TABLET | Freq: Three times a day (TID) | ORAL | Status: DC

## 2014-12-14 NOTE — Telephone Encounter (Signed)
Rx faxed in.

## 2015-01-09 ENCOUNTER — Telehealth: Payer: Self-pay | Admitting: Internal Medicine

## 2015-01-09 NOTE — Telephone Encounter (Signed)
Call to patient to confirm appointment for 01/10/15 at 3:30 lmtcb

## 2015-01-10 ENCOUNTER — Ambulatory Visit: Payer: Self-pay

## 2015-01-16 ENCOUNTER — Other Ambulatory Visit: Payer: Self-pay | Admitting: *Deleted

## 2015-01-16 DIAGNOSIS — I10 Essential (primary) hypertension: Secondary | ICD-10-CM

## 2015-01-17 ENCOUNTER — Encounter: Payer: Self-pay | Admitting: *Deleted

## 2015-01-18 MED ORDER — HYDRALAZINE HCL 10 MG PO TABS: 10.0000 mg | ORAL_TABLET | Freq: Three times a day (TID) | ORAL | Status: DC

## 2015-01-18 NOTE — Telephone Encounter (Signed)
Rx faxed in.

## 2015-01-31 ENCOUNTER — Encounter: Payer: Self-pay | Admitting: Sports Medicine

## 2015-01-31 ENCOUNTER — Ambulatory Visit (INDEPENDENT_AMBULATORY_CARE_PROVIDER_SITE_OTHER): Payer: Self-pay | Admitting: Sports Medicine

## 2015-01-31 VITALS — BP 157/82 | Ht 64.0 in | Wt 140.0 lb

## 2015-01-31 DIAGNOSIS — M722 Plantar fascial fibromatosis: Secondary | ICD-10-CM

## 2015-01-31 NOTE — Progress Notes (Signed)
  Samuel Chen - 59 y.o. male MRN [id number]  Date of birth: [birth year]  SUBJECTIVE:  Including CC & ROS.  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 Patient describes a dull soreness to the plantar aspect of his heel tingling on the medial side and some over his Achilles tendon. Patient describes the pain is worse when getting up in the morning and as the day progresses on. 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 He is not tried over-the-counter topical treatment or oral medications. He has not done any icing or stretching at this point. Has any previous history of injury to his foot or ankle past.   ROS: Review of systems otherwise negative except for information present in HPI  HISTORY: Past Medical, Surgical, Social, and Family History Reviewed & Updated per EMR. Pertinent Historical Findings include: HTN, COPD, GERD, rotator cuff tendinopathy, hyperlipidemia,  DATA REVIEWED: No other data available  PHYSICAL EXAM:  VS: BP:(!) 157/82 mmHg  HR: bpm  TEMP: ( )  RESP:   HT:5\' 4"  (162.6 cm)   WT:140 lb (63.504 kg)  BMI:24.1 FEET/ GAIT EXAM:  General: well nourished Skin of LE: warm; dry, no rashes, lesions, ecchymosis or erythema. Vascular: Dorsal pedal pulses 2+ bilaterally Neurologically: Sensation to light touch lower extremities equal and intact bilaterally.  Observation - no ecchymosis, no edema, or hematoma present  Palpation:Tenderness to palpation over the medial insertion of the plantar fascia of the left foot. Mild tenderness over the retrocalcaneal bursa of the Achilles with no swelling erythema or nodularity Normal ankle motion and strength bilaterally  Extension/flexion 5/5 strength bilaterally in toes Weight-bearing foot exam:  First ray: Mild first and second ray splaying  Second ray: Normal Longitudinal  arch: Mild collapse of medial arch Heel position: Mild valgus  MSK US: Visualization of patient's left plantar fasciitis revealed normal thickness at 0. 4 cm, with mild hypoechoic change particularly on the medial aspect of the fascia insertion  ASSESSMENT & PLAN: See problem based charting & AVS for pt instructions. Impression: Left foot plantar fasciitis  Recommendations: -Advised patient he is in the early stages of plantar fasciitis and discuss with patient that this can take sometimes 6-12 months to completely resolved. -Discuss a combination of treatment modalities that can help control pain including regular icing, aggressive when her fascia and calf muscle stretching, some strengthening of the calf, and adequate foot support. -Placed patient in green insoles with small scaphoid pad and her arch strap for additional support and comfort. -Patient will follow-up within one month to see Dr. Margaretha Sheffieldraper to see if his symptoms have improved. Possibly placing him in custom orthotics for additional support and comfort.

## 2015-02-09 ENCOUNTER — Ambulatory Visit (INDEPENDENT_AMBULATORY_CARE_PROVIDER_SITE_OTHER): Payer: Self-pay | Admitting: Internal Medicine

## 2015-02-09 VITALS — BP 171/95 | HR 77 | Temp 98.1°F | Wt 154.3 lb

## 2015-02-09 DIAGNOSIS — F1010 Alcohol abuse, uncomplicated: Secondary | ICD-10-CM

## 2015-02-09 DIAGNOSIS — I10 Essential (primary) hypertension: Secondary | ICD-10-CM

## 2015-02-09 MED ORDER — LOSARTAN POTASSIUM 25 MG PO TABS: 25.0000 mg | ORAL_TABLET | Freq: Every day | ORAL | Status: DC

## 2015-02-09 NOTE — Assessment & Plan Note (Signed)
Pt states that he has cut back on his EtOH use and is now drinking approx 2x week, drinking 5-6 beers.  - Counseled the patient on EtOH cessation, which may improve his blood pressure.

## 2015-02-09 NOTE — Progress Notes (Signed)
Patient ID: Samuel Chen, male   DOB: [birth year], 59 y.o.   MRN: [id number]  Subjective:   Patient ID: Samuel Chen male   DOB: 12/30/1955 59 y.o.   MRN: [id number]  HPI: Samuel Chen is a 59 y.o. M w/ PMH HTN, COPD Gold B, and HLD presents for a blood pressure check.   Please refer to Problem List based charting  Past Medical History  Diagnosis Date  . GERD (gastroesophageal reflux disease)   . Hypertension   . Hyperlipidemia   . COPD (chronic obstructive pulmonary disease)     Questionable diagnosis.  . 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 Myalgia 11/2007    Likely 2/2 Pravachol  . Dyshidrotic eczema   . Tobacco abuse   . Allergic rhinitis    Current Outpatient Prescriptions  Medication Sig Dispense Refill  . albuterol (PROVENTIL HFA;VENTOLIN HFA) 108 (90 BASE) MCG/ACT inhaler Inhale 2 puffs into the lungs every 6 (six) hours as needed for wheezing. 1 Inhaler 6  . clobetasol ointment (TEMOVATE) 0.05 % Apply topically 2 (two) times daily as needed. Apply to the itchy areas on legs twice a day for seven days 60 g 3  . desloratadine (CLARINEX) 5 MG tablet Take 1 tablet (5 mg total) by mouth daily. 30 tablet 2  . Dexlansoprazole (DEXILANT) 30 MG capsule Take 1 capsule (30 mg total) by mouth daily. 30 capsule 3  . Fluticasone-Salmeterol (ADVAIR) 250-50 MCG/DOSE AEPB Inhale 1 puff into the lungs 2 (two) times daily. 1 each 11  . hydrALAZINE (APRESOLINE) 10 MG tablet Take 1 tablet (10 mg total) by mouth 3 (three) times daily. 90 tablet 11  . metoprolol (TOPROL-XL) 200 MG 24 hr tablet Take 1 tablet (200 mg total) by mouth daily. 90 tablet 3  . mometasone (NASONEX) 50 MCG/ACT nasal spray Place 2 sprays into the nose daily. One in each nostril 17 g 11  . rosuvastatin (CRESTOR) 10 MG tablet Take 1 tablet (10 mg total) by mouth at bedtime. 30 tablet 11  . sildenafil (VIAGRA) 100 MG tablet Take 1 tablet (100 mg total) by mouth as needed for erectile dysfunction.  30 tablet 5   No current facility-administered medications for this visit.   Family History  Problem Relation Age of Onset  . Heart disease Sister   . Heart attack Sister     34-35yo  . Heart disease Father    History   Social History  . Marital Status: Single    Spouse Name: N/A  . Number of Children: N/A  . Years of Education: N/A   Social History Main Topics  . Smoking status: Former Smoker -- 1.50 packs/day for 40 years    Types: Cigarettes    Start date: 06/03/1983    Quit date: 03/04/2013  . Smokeless tobacco: Never Used     Comment: Uses a Vapor.  . Alcohol Use: Yes     Comment: Drink beer.  . Drug Use: No  . Sexual Activity: Not on file   Other Topics Concern  . Not on file   Social History Narrative   Financial assistance approved for 100% discount at [facility] and has [facility] card per Rudell Cobb October 13,2011 4:21PM   Review of Systems: Constitutional: Denies fever, chills, diaphoresis, appetite change, or fatigue.  HEENT: Denies congestion or neck pain Respiratory: Denies SOB, DOE Cardiovascular: Denies chest pain or leg swelling.  Gastrointestinal: Denies nausea, vomiting, abdominal pain, Genitourinary: Denies dysuria, urgency, frequency  Musculoskeletal: Denies myalgias or gait problem.  Skin: Denies rash or wound.  Neurological: Denies dizziness, syncope, weakness Psychiatric/Behavioral: Denies mood changes or confusion  Objective:  Physical Exam: Filed Vitals:   02/09/15 1529  BP: 173/83  Pulse: 73  Temp: 98.1 F (36.7 C)  TempSrc: Oral  Weight: 154 lb 4.8 oz (69.99 kg)  SpO2: 98%   Constitutional: Vital signs reviewed.  Patient is a well-developed and well-nourished male in no acute distress and cooperative with exam. Alert and oriented x3.  Head: Normocephalic and atraumatic Eyes: PERRL, EOMI. No scleral icterus.  Neck: Supple, Trachea midline, normal ROM, No JVD, mass, thyromegaly.  Cardiovascular: RRR, no MRG Pulmonary/Chest: Normal  respiratory effort, CTAB, no wheezes, rales, or rhonchi Abdominal: Soft. Non-tender, non-distended Musculoskeletal: No joint deformities, erythema, or stiffness, ROM full Hematology: No cervical adenopathy.  Neurological: A&O x3, Strength is normal and symmetric bilaterally, cranial nerve II-XII are grossly intact, no focal deficits.  Skin: Warm, dry and intact.   Psychiatric: Normal mood and affect. Speech and behavior is normal.    Assessment & Plan:   Please refer to Problem List based Assessment and Plan

## 2015-02-09 NOTE — Patient Instructions (Addendum)
Stop taking the hydralazine.  Begin taking Losartan 25mg  (1 tablet) daily.   Return to the clinic in 2 weeks for a blood pressure recheck.    General Instructions:   Please bring your medicines with you each time you come to clinic.  Medicines may include prescription medications, over-the-counter medications, herbal remedies, eye drops, vitamins, or other pills.   Progress Toward Treatment Goals:  Treatment Goal 11/03/2014  Blood pressure unchanged  Stop smoking -    Self Care Goals & Plans:  Self Care Goal 02/09/2015  Manage my medications take my medicines as prescribed; bring my medications to every visit; refill my medications on time  Monitor my health -  Eat healthy foods eat foods that are low in salt; eat baked foods instead of fried foods  Be physically active take a walk every day; find an activity I enjoy  Stop smoking -    No flowsheet data found.   Care Management & Community Referrals:  Referral 07/14/2014  Referrals made for care management support -  Referrals made to community resources smoking cessation

## 2015-02-09 NOTE — Progress Notes (Signed)
Case discussed with Dr. Glenn at the time of the visit.  We reviewed the resident's history and exam and pertinent patient test results.  I agree with the assessment, diagnosis, and plan of care documented in the resident's note.   

## 2015-02-15 ENCOUNTER — Telehealth: Payer: Self-pay | Admitting: *Deleted

## 2015-02-15 NOTE — Assessment & Plan Note (Addendum)
BP Readings from Last 3 Encounters:  02/09/15 171/95  01/31/15 157/82  11/03/14 164/84    Lab Results  Component Value Date   NA 138 04/14/2014   K 3.7 04/14/2014   CREATININE 0.97 04/14/2014    Assessment: Blood pressure control: moderately elevated Progress toward BP goal:  deteriorated (Pt havign trouble remembering to take the Hydralazine TID)  He was last seen in March and was started on Hydralazine TID in addition to his Toprol XL. He is having a hard time taking the Hydralazine TID. Pt has not tolerated CCB, ACEi, or diuretics in the past. He has been on a combination of Losartan-HCTZ in the past, which seemed to have been stopped by the patient due to erectile dysfunction and muscle cramping that the pt associated with the HCTZ. An ARB has not been retried.     Plan: Medications:  Stopping hydralazine. Starting Losartan 25mg  po daily Educational resources provided: brochure Self management tools provided: home blood pressure logbook Other plans: F/u in 2 weeks for blood pressure recheck and BMP.    Addendum 02/16/15: [facility] Will change to Benicar 20mg  daily. Rx to be faxed.

## 2015-02-15 NOTE — Telephone Encounter (Signed)
[facility] can obtain benicar for free, would you consider changing from Losartan potas 50 mg 1/2 pill dily??

## 2015-02-16 MED ORDER — OLMESARTAN MEDOXOMIL 20 MG PO TABS: 20.0000 mg | ORAL_TABLET | Freq: Every day | ORAL | Status: DC

## 2015-02-16 NOTE — Addendum Note (Signed)
Addended by: Charlsie Merles F on: 02/16/2015 12:28 PM   Modules accepted: Orders, Medications

## 2015-02-22 NOTE — Telephone Encounter (Signed)
Dr. Sherrine Maples changed it from losartan to benicar on 6/16

## 2015-03-08 ENCOUNTER — Ambulatory Visit (INDEPENDENT_AMBULATORY_CARE_PROVIDER_SITE_OTHER): Payer: Self-pay | Admitting: Sports Medicine

## 2015-03-08 ENCOUNTER — Encounter: Payer: Self-pay | Admitting: Sports Medicine

## 2015-03-08 VITALS — BP 130/72 | HR 73 | Ht 64.0 in | Wt 145.0 lb

## 2015-03-08 DIAGNOSIS — M722 Plantar fascial fibromatosis: Secondary | ICD-10-CM

## 2015-03-08 MED ORDER — MELOXICAM 15 MG PO TABS: ORAL_TABLET | ORAL | Status: DC

## 2015-03-09 ENCOUNTER — Telehealth: Payer: Self-pay | Admitting: *Deleted

## 2015-03-09 NOTE — Telephone Encounter (Signed)
[facility] with questions about the mobic rx.  They will only fill a month supply of 30 tablets and not the 40, since its taken as needed after a week.

## 2015-03-09 NOTE — Progress Notes (Signed)
   Subjective:    Patient ID: Samuel Chen, male    DOB: [birth year], 59 y.o.   MRN: [id number]  HPI  Patient comes in today for follow-up on left heel plantar fasciitis. Still struggling with pain. He localizes it to the plantar aspect of the heel only. He removed the green sports insoles stating that the scaphoid pads were uncomfortable. He has been using his arch strap intermittently. He states that he has been doing his stretches. He has also been icing with a water bottle. No numbness or tingling. Pain is worse with first step in the morning as well as at the end of the day.    Review of Systems     Objective:   Physical Exam Well-developed, well-nourished. No acute distress.  Left foot: Tenderness to palpation at the origin of the plantar fascia. No soft tissue swelling. Negative calcaneal squeeze. Neurovascular intact distally. Walks with a slight limp.       Assessment & Plan:  Persistent left heel pain secondary to plantar fasciitis  I have educated the patient about the fact that this will take several months before resolution. I would like for him to resume his green sports insoles but I've asked him to remove the uncomfortable scaphoid pads. I have also provided him information on both Sorbothane heel cups and [place] socks. He will continue with plantar fascial stretches. I recommended that he try ice water immersion instead of a frozen water bottle. We will also try him on some meloxicam. He will try it for 7 days and then as needed. Continue with activity as tolerated and follow-up as needed.

## 2015-04-10 ENCOUNTER — Other Ambulatory Visit: Payer: Self-pay | Admitting: *Deleted

## 2015-04-10 ENCOUNTER — Encounter: Payer: Self-pay | Admitting: Sports Medicine

## 2015-04-10 ENCOUNTER — Ambulatory Visit (INDEPENDENT_AMBULATORY_CARE_PROVIDER_SITE_OTHER): Payer: Self-pay | Admitting: Sports Medicine

## 2015-04-10 VITALS — BP 120/73 | Ht 64.0 in | Wt 145.0 lb

## 2015-04-10 DIAGNOSIS — M25512 Pain in left shoulder: Secondary | ICD-10-CM

## 2015-04-10 DIAGNOSIS — M722 Plantar fascial fibromatosis: Secondary | ICD-10-CM

## 2015-04-10 MED ORDER — METHYLPREDNISOLONE ACETATE 40 MG/ML IJ SUSP
40.0000 mg | Freq: Once | INTRAMUSCULAR | Status: AC
  Administered 2015-04-10: 40 mg via INTRA_ARTICULAR

## 2015-04-10 MED ORDER — MELOXICAM 15 MG PO TABS: ORAL_TABLET | ORAL | Status: DC

## 2015-04-10 NOTE — Progress Notes (Signed)
   Subjective:    Patient ID: Samuel Chen, male    DOB: [birth year], 59 y.o.   MRN: [id number]  HPI   Patient comes in today for follow-up on left foot plantar fasciitis. Left heel is feeling a lot better. Still some intermittent discomfort but not severe. He has been wearing his green sports insoles which have felt a lot more comfortable since removing the scaphoid pads. He has been doing his plantar fascial stretches and his daily icing. He has found the meloxicam to be tremendously helpful. Today he is concerned about bilateral shoulder pain. Pain seems to start in his neck and radiates through both trapezius muscles into the shoulder. He is also complaining of some deep-seated shoulder pain bilaterally as well. No recent trauma. He has responded well to subacromial cortisone injections in the past. No numbness or tingling.    Review of Systems     Objective:   Physical Exam Well-developed, well-nourished. No acute distress. Awake alert and oriented 3. Vital signs reviewed  Cervical spine: Full painless cervical range of motion. No tenderness to palpation along the cervical midline. Mild tenderness to palpation along the trapezius muscles bilaterally but no palpable trigger point.  Shoulders: Examination of each shoulder shows full range of motion with a positive painful arc bilaterally. Positive empty can and positive Hawkins bilaterally as well. Rotator cuff strength is 5/5 bilaterally but reproducible pain with resisted supraspinatus. No tenderness over the [facility] joint. No tenderness over the bicipital groove. Neurovascularly intact distally.  Left heel: Minimal tenderness to palpation at the origin of the plantar fascia. Negative calcaneal squeeze. No soft tissue swelling. Walking without significant limp.       Assessment & Plan:  Bilateral shoulder pain, left greater than right secondary to rotator cuff impingement Improving left heel pain secondary to plantar fasciitis  For  the patient's left shoulder I've recommended a repeat subacromial cortisone injection. Patient will return to the office in 3 weeks if needed to have the right shoulder injected. I have refilled his meloxicam to take as needed and he will continue with his green sports insoles, plantar fascial stretching, and icing until heel pain completely resolves. Call with questions or concerns prior to his follow-up visit.  Consent obtained and verified. Time-out conducted. Noted no overlying erythema, induration, or other signs of local infection. Skin prepped in a sterile fashion. Topical analgesic spray: Ethyl chloride. Joint: left shoulder (subacromial) Needle: 25g 1.5 inch Completed without difficulty. Meds: 3cc 1% xylocaine, 1cc ( ) depomedrol  Advised to call if fevers/chills, erythema, induration, drainage, or persistent bleeding.

## 2015-04-10 NOTE — Addendum Note (Signed)
Addended by: Annita Brod on: 04/10/2015 04:04 PM   Modules accepted: Orders

## 2015-04-18 ENCOUNTER — Ambulatory Visit (INDEPENDENT_AMBULATORY_CARE_PROVIDER_SITE_OTHER): Payer: Self-pay | Admitting: Internal Medicine

## 2015-04-18 ENCOUNTER — Other Ambulatory Visit: Payer: Self-pay | Admitting: *Deleted

## 2015-04-18 ENCOUNTER — Encounter: Payer: Self-pay | Admitting: Internal Medicine

## 2015-04-18 VITALS — BP 134/80 | HR 92 | Temp 98.0°F | Ht 64.0 in | Wt 155.8 lb

## 2015-04-18 DIAGNOSIS — J019 Acute sinusitis, unspecified: Secondary | ICD-10-CM

## 2015-04-18 DIAGNOSIS — J302 Other seasonal allergic rhinitis: Secondary | ICD-10-CM

## 2015-04-18 DIAGNOSIS — B9689 Other specified bacterial agents as the cause of diseases classified elsewhere: Secondary | ICD-10-CM | POA: Insufficient documentation

## 2015-04-18 MED ORDER — DESLORATADINE 5 MG PO TABS: 5.0000 mg | ORAL_TABLET | Freq: Every day | ORAL | Status: DC

## 2015-04-18 MED ORDER — AMOXICILLIN-POT CLAVULANATE 875-125 MG PO TABS: 1.0000 | ORAL_TABLET | Freq: Two times a day (BID) | ORAL | Status: AC

## 2015-04-18 NOTE — Progress Notes (Signed)
Patient ID: Samuel Chen, male   DOB: 1956-08-08, 59 y.o.   MRN: [id number]   Subjective:   Patient ID: Samuel Chen male   DOB: 1956-07-28 59 y.o.   MRN: [id number]  HPI: Samuel Chen is a 59 y.o. male with a PMH of HTN, HLD, GERD, COPD here today with complaints of sinus fullness and mucus production for the past month. He reports that he has alternated between feeling "stuffed-up" and having a running nose over the past month. Green sputum production. Notes intermittent mild frontal headache and feeling full in his sinus area. Denies any fever but does note one episode of chills a few weeks ago. No blurry vision, neck pain, sore throat, ear pain, cough, SOB. Has had sinus infections in the the past treated effectively with antibiotics.   Past Medical History  Diagnosis Date  . GERD (gastroesophageal reflux disease)   . Hypertension   . Hyperlipidemia   . COPD (chronic obstructive pulmonary disease)     Questionable diagnosis.  . 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 Myalgia 11/2007    Likely 2/2 Pravachol  . Dyshidrotic eczema   . Tobacco abuse   . Allergic rhinitis    Current Outpatient Prescriptions  Medication Sig Dispense Refill  . albuterol (PROVENTIL HFA;VENTOLIN HFA) 108 (90 BASE) MCG/ACT inhaler Inhale 2 puffs into the lungs every 6 (six) hours as needed for wheezing. 1 Inhaler 6  . clobetasol ointment (TEMOVATE) 0.05 % Apply topically 2 (two) times daily as needed. Apply to the itchy areas on legs twice a day for seven days 60 g 3  . desloratadine (CLARINEX) 5 MG tablet Take 1 tablet (5 mg total) by mouth daily. 30 tablet 2  . Dexlansoprazole (DEXILANT) 30 MG capsule Take 1 capsule (30 mg total) by mouth daily. 30 capsule 3  . Fluticasone-Salmeterol (ADVAIR) 250-50 MCG/DOSE AEPB Inhale 1 puff into the lungs 2 (two) times daily. 1 each 11  . meloxicam (MOBIC) 15 MG tablet Take one tablet daily for 7 days, then take as needed for pain 60 tablet 0  .  metoprolol (TOPROL-XL) 200 MG 24 hr tablet Take 1 tablet (200 mg total) by mouth daily. 90 tablet 3  . mometasone (NASONEX) 50 MCG/ACT nasal spray Place 2 sprays into the nose daily. One in each nostril 17 g 11  . olmesartan (BENICAR) 20 MG tablet Take 1 tablet (20 mg total) by mouth daily. 30 tablet 2  . rosuvastatin (CRESTOR) 10 MG tablet Take 1 tablet (10 mg total) by mouth at bedtime. 30 tablet 11  . sildenafil (VIAGRA) 100 MG tablet Take 1 tablet (100 mg total) by mouth as needed for erectile dysfunction. 30 tablet 5   No current facility-administered medications for this visit.   Family History  Problem Relation Age of Onset  . Heart disease Sister   . Heart attack Sister     34-35yo  . Heart disease Father    Social History   Social History  . Marital Status: Single    Spouse Name: N/A  . Number of Children: N/A  . Years of Education: N/A   Social History Main Topics  . Smoking status: Former Smoker -- 1.50 packs/day for 40 years    Types: Cigarettes    Start date: 06/03/1983    Quit date: 03/04/2013  . Smokeless tobacco: Never Used     Comment: Uses a Vapor.  . Alcohol Use: Yes     Comment: Drink  beer.  . Drug Use: No  . Sexual Activity: Not Asked   Other Topics Concern  . None   Social History Narrative   Financial assistance approved for 100% discount at [facility] and has [facility] card per Rudell Cobb October 13,2011 4:21PM   Review of Systems: Review of Systems  Constitutional: Negative for fever and chills.  HENT: Positive for congestion. Negative for ear discharge, ear pain, hearing loss and sore throat.   Eyes: Negative for blurred vision and pain.  Respiratory: Negative for cough and shortness of breath.   Gastrointestinal: Negative for nausea, vomiting, abdominal pain, diarrhea and constipation.  Genitourinary: Negative for dysuria.  Musculoskeletal: Negative for myalgias.  Neurological: Positive for headaches.   Objective:  Physical Exam: Filed Vitals:     04/18/15 1544  BP: 134/80  Pulse: 92  Temp: 98 F (36.7 C)  TempSrc: Oral  Height:  (1.626 m)  Weight: 155 lb 12.8 oz (70.67 kg)  SpO2: 98%   Physical Exam GENERAL- alert, co-operative, appears as stated age, not in any distress. HEENT- Atraumatic, normocephalic, PERRL, EOMI, oral mucosa appears moist, good and intact dentition. No carotid bruit, no cervical LN enlargement, thyroid does not appear enlarged, neck supple. No tenderness to palpation.  CARDIAC- RRR, no murmurs, rubs or gallops. RESP- Moving equal volumes of air, and clear to auscultation bilaterally, no wheezes or crackles. ABDOMEN- Soft, nontender, no guarding or rebound, no palpable masses or organomegaly, bowel sounds present. NEURO- No obvious Cr N abnormality EXTREMITIES- pulse 2+, symmetric, no pedal edema. SKIN- Warm, dry, No rash or lesion. PSYCH- Normal mood and affect, appropriate thought content and speech.  Assessment & Plan:  Case discussed with Dr. Dalphine Handing. Please refer to Problem based carting for further details of today's visit.

## 2015-04-18 NOTE — Progress Notes (Signed)
59 year male with subacute sinusitis. No shortness of breath, chest pain, fever. Exam - heart and lungs within normal limits, no cervical lymphadenopathy. Agree with Dr Karma Greaser about prescribing short course of Augmentin. Patient has no insurance - [employer] they should be about $14. Aletta Edouard MD MPH 04/18/2015 4:18 PM

## 2015-04-18 NOTE — Assessment & Plan Note (Signed)
Here today with complaints of sinus fullness and mucus production for the past month. He reports that he has alternated between feeling "stuffed-up" and having a running nose over the past month. Green sputum production. Notes intermittent mild frontal headache and feeling full in his sinus area. Denies any fever but does note one episode of chills a few weeks ago. No blurry vision, neck pain, sore throat, ear pain, cough, SOB. Has had sinus infections in the the past treated effectively with antibiotics.   - Will give Augmentin 875-125 mg bid x 5 days. If no improvement, RTC.

## 2015-04-18 NOTE — Patient Instructions (Signed)
Thank you for coming in today  - I have given you a prescription for Augment to treat your Sinus Infection. It is twice a day for 5 days.  - If you develop any fevers, chills, blurry vision or worsening symptoms please call the clinic - Follow up as needed  Sinusitis Sinusitis is redness, soreness, and inflammation of the paranasal sinuses. Paranasal sinuses are air pockets within the bones of your face (beneath the eyes, the middle of the forehead, or above the eyes). In healthy paranasal sinuses, mucus is able to drain out, and air is able to circulate through them by way of your nose. However, when your paranasal sinuses are inflamed, mucus and air can become trapped. This can allow bacteria and other germs to grow and cause infection. Sinusitis can develop quickly and last only a short time (acute) or continue over a long period (chronic). Sinusitis that lasts for more than 12 weeks is considered chronic.  CAUSES  Causes of sinusitis include:  Allergies.  Structural abnormalities, such as displacement of the cartilage that separates your nostrils (deviated septum), which can decrease the air flow through your nose and sinuses and affect sinus drainage.  Functional abnormalities, such as when the small hairs (cilia) that line your sinuses and help remove mucus do not work properly or are not present. SIGNS AND SYMPTOMS  Symptoms of acute and chronic sinusitis are the same. The primary symptoms are pain and pressure around the affected sinuses. Other symptoms include:  Upper toothache.  Earache.  Headache.  Bad breath.  Decreased sense of smell and taste.  A cough, which worsens when you are lying flat.  Fatigue.  Fever.  Thick drainage from your nose, which often is green and may contain pus (purulent).  Swelling and warmth over the affected sinuses. DIAGNOSIS  Your health care provider will perform a physical exam. During the exam, your health care provider may:  Look in  your nose for signs of abnormal growths in your nostrils (nasal polyps).  Tap over the affected sinus to check for signs of infection.  View the inside of your sinuses (endoscopy) using an imaging device that has a light attached (endoscope). If your health care provider suspects that you have chronic sinusitis, one or more of the following tests may be recommended:  Allergy tests.  Nasal culture. A sample of mucus is taken from your nose, sent to a lab, and screened for bacteria.  Nasal cytology. A sample of mucus is taken from your nose and examined by your health care provider to determine if your sinusitis is related to an allergy. TREATMENT  Most cases of acute sinusitis are related to a viral infection and will resolve on their own within 10 days. Sometimes medicines are prescribed to help relieve symptoms (pain medicine, decongestants, nasal steroid sprays, or saline sprays).  However, for sinusitis related to a bacterial infection, your health care provider will prescribe antibiotic medicines. These are medicines that will help kill the bacteria causing the infection.  Rarely, sinusitis is caused by a fungal infection. In theses cases, your health care provider will prescribe antifungal medicine. For some cases of chronic sinusitis, surgery is needed. Generally, these are cases in which sinusitis recurs more than 3 times per year, despite other treatments. HOME CARE INSTRUCTIONS   Drink plenty of water. Water helps thin the mucus so your sinuses can drain more easily.  Use a humidifier.  Inhale steam 3 to 4 times a day (for example, sit in the  bathroom with the shower running).  Apply a warm, moist washcloth to your face 3 to 4 times a day, or as directed by your health care provider.  Use saline nasal sprays to help moisten and clean your sinuses.  Take medicines only as directed by your health care provider.  If you were prescribed either an antibiotic or antifungal medicine,  finish it all even if you start to feel better. SEEK IMMEDIATE MEDICAL CARE IF:  You have increasing pain or severe headaches.  You have nausea, vomiting, or drowsiness.  You have swelling around your face.  You have vision problems.  You have a stiff neck.  You have difficulty breathing. MAKE SURE YOU:   Understand these instructions.  Will watch your condition.  Will get help right away if you are not doing well or get worse. Document Released: 08/19/2005 Document Revised: 01/03/2014 Document Reviewed: 09/03/2011 [facility] Patient Information 2015 [place], [place]. This information is not intended to replace advice given to you by your health care provider. Make sure you discuss any questions you have with your health care provider.

## 2015-04-19 NOTE — Telephone Encounter (Signed)
Med refilled during Office Visit

## 2015-04-24 ENCOUNTER — Other Ambulatory Visit: Payer: Self-pay | Admitting: *Deleted

## 2015-04-24 DIAGNOSIS — K219 Gastro-esophageal reflux disease without esophagitis: Secondary | ICD-10-CM

## 2015-04-24 MED ORDER — DEXLANSOPRAZOLE 30 MG PO CPDR: 30.0000 mg | DELAYED_RELEASE_CAPSULE | Freq: Every day | ORAL | Status: DC

## 2015-05-01 ENCOUNTER — Ambulatory Visit: Payer: Self-pay | Admitting: Sports Medicine

## 2015-05-25 ENCOUNTER — Ambulatory Visit (INDEPENDENT_AMBULATORY_CARE_PROVIDER_SITE_OTHER): Payer: Self-pay | Admitting: Internal Medicine

## 2015-05-25 ENCOUNTER — Encounter: Payer: Self-pay | Admitting: Internal Medicine

## 2015-05-25 VITALS — BP 156/74 | HR 64 | Temp 98.1°F | Ht 63.5 in | Wt 155.8 lb

## 2015-05-25 DIAGNOSIS — J069 Acute upper respiratory infection, unspecified: Secondary | ICD-10-CM | POA: Insufficient documentation

## 2015-05-25 MED ORDER — CHLORPHENIRAMINE-DM 4-30 MG PO TABS: 1.0000 | ORAL_TABLET | Freq: Four times a day (QID) | ORAL | Status: DC | PRN

## 2015-05-25 MED ORDER — OXYMETAZOLINE HCL 0.05 % NA SOLN: 1.0000 | Freq: Two times a day (BID) | NASAL | Status: AC

## 2015-05-25 NOTE — Patient Instructions (Signed)
USE AFRIN NASAL SPRAY 2 HOURS AFTER OR BEFORE YOU USE NASONEX. ONLY USE AFRIN TWICE A DAY FOR 3 DAYS THEN STOP.  TAKE CORICIDIN (DEXTROMETHORPHAN AND CHLORPHENIRAMINE FOR CONGESTION AND COUGH.   CONTINUE USING NASONEX AND ADVIL.  GET LOTS OF REST, DRINK PLENTY OF FLUIDS.   Upper Respiratory Infection, Adult An upper respiratory infection (URI) is also sometimes known as the common cold. The upper respiratory tract includes the nose, sinuses, throat, trachea, and bronchi. Bronchi are the airways leading to the lungs. Most people improve within 1 week, but symptoms can last up to 2 weeks. A residual cough may last even longer.  CAUSES Many different viruses can infect the tissues lining the upper respiratory tract. The tissues become irritated and inflamed and often become very moist. Mucus production is also common. A cold is contagious. You can easily spread the virus to others by oral contact. This includes kissing, sharing a glass, coughing, or sneezing. Touching your mouth or nose and then touching a surface, which is then touched by another person, can also spread the virus. SYMPTOMS  Symptoms typically develop 1 to 3 days after you come in contact with a cold virus. Symptoms vary from person to person. They may include:  Runny nose.  Sneezing.  Nasal congestion.  Sinus irritation.  Sore throat.  Loss of voice (laryngitis).  Cough.  Fatigue.  Muscle aches.  Loss of appetite.  Headache.  Low-grade fever. DIAGNOSIS  You might diagnose your own cold based on familiar symptoms, since most people get a cold 2 to 3 times a year. Your caregiver can confirm this based on your exam. Most importantly, your caregiver can check that your symptoms are not due to another disease such as strep throat, sinusitis, pneumonia, asthma, or epiglottitis. Blood tests, throat tests, and X-rays are not necessary to diagnose a common cold, but they may sometimes be helpful in excluding other more  serious diseases. Your caregiver will decide if any further tests are required. RISKS AND COMPLICATIONS  You may be at risk for a more severe case of the common cold if you smoke cigarettes, have chronic heart disease (such as heart failure) or lung disease (such as asthma), or if you have a weakened immune system. The very young and very old are also at risk for more serious infections. Bacterial sinusitis, middle ear infections, and bacterial pneumonia can complicate the common cold. The common cold can worsen asthma and chronic obstructive pulmonary disease (COPD). Sometimes, these complications can require emergency medical care and may be life-threatening. PREVENTION  The best way to protect against getting a cold is to practice good hygiene. Avoid oral or hand contact with people with cold symptoms. Wash your hands often if contact occurs. There is no clear evidence that vitamin C, vitamin E, echinacea, or exercise reduces the chance of developing a cold. However, it is always recommended to get plenty of rest and practice good nutrition. TREATMENT  Treatment is directed at relieving symptoms. There is no cure. Antibiotics are not effective, because the infection is caused by a virus, not by bacteria. Treatment may include:  Increased fluid intake. Sports drinks offer valuable electrolytes, sugars, and fluids.  Breathing heated mist or steam (vaporizer or shower).  Eating chicken soup or other clear broths, and maintaining good nutrition.  Getting plenty of rest.  Using gargles or lozenges for comfort.  Controlling fevers with ibuprofen or acetaminophen as directed by your caregiver.  Increasing usage of your inhaler if you have asthma.  Zinc gel and zinc lozenges, taken in the first 24 hours of the common cold, can shorten the duration and lessen the severity of symptoms. Pain medicines may help with fever, muscle aches, and throat pain. A variety of non-prescription medicines are  available to treat congestion and runny nose. Your caregiver can make recommendations and may suggest nasal or lung inhalers for other symptoms.  HOME CARE INSTRUCTIONS   Only take over-the-counter or prescription medicines for pain, discomfort, or fever as directed by your caregiver.  Use a warm mist humidifier or inhale steam from a shower to increase air moisture. This may keep secretions moist and make it easier to breathe.  Drink enough water and fluids to keep your urine clear or pale yellow.  Rest as needed.  Return to work when your temperature has returned to normal or as your caregiver advises. You may need to stay home longer to avoid infecting others. You can also use a face mask and careful hand washing to prevent spread of the virus. SEEK MEDICAL CARE IF:   After the first few days, you feel you are getting worse rather than better.  You need your caregiver's advice about medicines to control symptoms.  You develop chills, worsening shortness of breath, or brown or red sputum. These may be signs of pneumonia.  You develop yellow or brown nasal discharge or pain in the face, especially when you bend forward. These may be signs of sinusitis.  You develop a fever, swollen neck glands, pain with swallowing, or white areas in the back of your throat. These may be signs of strep throat. SEEK IMMEDIATE MEDICAL CARE IF:   You have a fever.  You develop severe or persistent headache, ear pain, sinus pain, or chest pain.  You develop wheezing, a prolonged cough, cough up blood, or have a change in your usual mucus (if you have chronic lung disease).  You develop sore muscles or a stiff neck. Document Released: 02/12/2001 Document Revised: 11/11/2011 Document Reviewed: 11/24/2013 [facility] Patient Information 2015 [place], [place]. This information is not intended to replace advice given to you by your health care provider. Make sure you discuss any questions you have with your  health care provider.

## 2015-05-25 NOTE — Progress Notes (Signed)
Subjective:    Patient ID: Samuel Chen, male    DOB: 15-Jan-1956, 59 y.o.   MRN: [id number]  HPI JOSEALFREDO ADKINS is a 59 y.o. male with PMHx of HTN, COPD, HLD who presents to the clinic for nasal congestion. Please see A&P for the status of the patient's chronic medical problems.   Past Medical History  Diagnosis Date  . GERD (gastroesophageal reflux disease)   . Hypertension   . Hyperlipidemia   . COPD (chronic obstructive pulmonary disease)     Questionable diagnosis.  . 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 Myalgia 11/2007    Likely 2/2 Pravachol  . Dyshidrotic eczema   . Tobacco abuse   . Allergic rhinitis     Outpatient Encounter Prescriptions as of 05/25/2015  Medication Sig  . albuterol (PROVENTIL HFA;VENTOLIN HFA) 108 (90 BASE) MCG/ACT inhaler Inhale 2 puffs into the lungs every 6 (six) hours as needed for wheezing.  . Chlorpheniramine-DM 4-30 MG TABS Take 1 tablet by mouth every 6 (six) hours as needed.  . clobetasol ointment (TEMOVATE) 0.05 % Apply topically 2 (two) times daily as needed. Apply to the itchy areas on legs twice a day for seven days  . desloratadine (CLARINEX) 5 MG tablet Take 1 tablet (5 mg total) by mouth daily.  Marland Kitchen Dexlansoprazole (DEXILANT) 30 MG capsule Take 1 capsule (30 mg total) by mouth daily.  . Fluticasone-Salmeterol (ADVAIR) 250-50 MCG/DOSE AEPB Inhale 1 puff into the lungs 2 (two) times daily.  . meloxicam (MOBIC) 15 MG tablet Take one tablet daily for 7 days, then take as needed for pain  . metoprolol (TOPROL-XL) 200 MG 24 hr tablet Take 1 tablet (200 mg total) by mouth daily.  . mometasone (NASONEX) 50 MCG/ACT nasal spray Place 2 sprays into the nose daily. One in each nostril  . olmesartan (BENICAR) 20 MG tablet Take 1 tablet (20 mg total) by mouth daily.  Marland Kitchen oxymetazoline (AFRIN 12 HOUR) 0.05 % nasal spray Place 1 spray into both nostrils 2 (two) times daily.  . rosuvastatin (CRESTOR) 10 MG tablet Take 1 tablet (10 mg  total) by mouth at bedtime.  . sildenafil (VIAGRA) 100 MG tablet Take 1 tablet (100 mg total) by mouth as needed for erectile dysfunction.   No facility-administered encounter medications on file as of 05/25/2015.    Family History  Problem Relation Age of Onset  . Heart disease Sister   . Heart attack Sister     34-35yo  . Heart disease Father     Social History   Social History  . Marital Status: Single    Spouse Name: N/A  . Number of Children: N/A  . Years of Education: N/A   Occupational History  . Not on file.   Social History Main Topics  . Smoking status: Former Smoker -- 1.50 packs/day for 40 years    Types: Cigarettes    Start date: 06/03/1983    Quit date: 03/04/2013  . Smokeless tobacco: Never Used     Comment: Uses a Vapor.  . Alcohol Use: Yes     Comment: Drink beer.  . Drug Use: No  . Sexual Activity: Not on file   Other Topics Concern  . Not on file   Social History Narrative   Financial assistance approved for 100% discount at [facility] and has [facility] card per Rudell Cobb October 13,2011 4:21PM   Review of Systems General: Denies fever, chills, fatigue and diaphoresis.  HEENT:  Admits to nasal congestion, runny nose, sinus pressure headache. Denies ear pain, sore throat.  Respiratory: Admits to non-productive cough. Denies SOB, DOE, chest tightness, and wheezing.   Cardiovascular: Denies chest pain and palpitations.  Gastrointestinal: Denies nausea, vomiting, abdominal pain, diarrhea, constipation  Skin: Denies pallor, rash and wounds.  Neurological: Denies dizziness, headaches, weakness, lightheadedness     Objective:   Physical Exam Filed Vitals:   05/25/15 1454  BP: 156/74  Pulse: 64  Temp: 98.1 F (36.7 C)  TempSrc: Oral  Height: 5' 3.5" (1.613 m)  Weight: 155 lb 12.8 oz (70.67 kg)  SpO2: 99%   General: Vital signs reviewed.  Patient is well-developed and well-nourished, in no acute distress and cooperative with exam.  HEENT:  Conjunctivae normal, no scleral icterus. Normal tympanic membranes. Edematous nasal turbinates. Pink posterior oropharynx without exudate. +Cervical Lymphadenopathy Cardiovascular: RRR, S1 normal, S2 normal, no murmurs, gallops, or rubs. Pulmonary/Chest: Clear to auscultation bilaterally, no wheezes, rales, or rhonchi. Abdominal: Soft, non-tender, non-distended, BS + Extremities: No lower extremity edema bilaterally,  pulses symmetric and intact bilaterally.  Skin: Warm, dry and intact. No rashes or erythema. Psychiatric: Normal mood and affect. speech and behavior is normal. Cognition and memory are normal.     Assessment & Plan:   Please see problem based assessment and plan.

## 2015-05-25 NOTE — Assessment & Plan Note (Signed)
Patient continues to have nasal congestion, runny nose, post-nasal drip and non-productive cough and sinus pressure headache despite completing Augmentin and using Nasonex. Patient likely has a viral URI although this has persisted for a long time. No objective evidence of bacterial infection; especially since symptoms did not improve with abx. Doubt fungal infection as patient is not immunocompromised.   Plan: -Afrin x 3 days -Continue Nasonex -Coricidin DM -Ibuprofen prn

## 2015-05-26 NOTE — Progress Notes (Signed)
Internal Medicine Clinic Attending  Case discussed with Dr. Richardson at the time of the visit.  We reviewed the resident's history and exam and pertinent patient test results.  I agree with the assessment, diagnosis, and plan of care documented in the resident's note. 

## 2015-07-10 ENCOUNTER — Other Ambulatory Visit: Payer: Self-pay

## 2015-07-10 DIAGNOSIS — J449 Chronic obstructive pulmonary disease, unspecified: Secondary | ICD-10-CM

## 2015-07-13 MED ORDER — ALBUTEROL SULFATE HFA 108 (90 BASE) MCG/ACT IN AERS: 2.0000 | INHALATION_SPRAY | Freq: Four times a day (QID) | RESPIRATORY_TRACT | Status: DC | PRN

## 2015-07-24 ENCOUNTER — Telehealth: Payer: Self-pay

## 2015-07-24 NOTE — Telephone Encounter (Signed)
Rec'd fax request from [facility]: They no longer carry patients metoprolol succinate. They carry metoprolol tartrate and have advised that it would be sam mg dose.  Can rx be updated?           

## 2015-07-26 ENCOUNTER — Ambulatory Visit: Payer: Self-pay

## 2015-08-01 NOTE — Telephone Encounter (Signed)
Please update his prescription to Metoprolol Tartrate 200 mg BID.

## 2015-08-02 ENCOUNTER — Ambulatory Visit (HOSPITAL_COMMUNITY)
Admission: RE | Admit: 2015-08-02 | Discharge: 2015-08-02 | Disposition: A | Discharge status: Home / Self Care | Payer: Self-pay | Source: Ambulatory Visit | Attending: Internal Medicine | Admitting: Internal Medicine

## 2015-08-03 NOTE — Telephone Encounter (Signed)
Verbal update can be given to pharmacy by you [facility]

## 2015-08-03 NOTE — Telephone Encounter (Signed)
When giving order to pharmacy they advise the proper conversion for metoprolol succinate to metoprolol tartrate for this pt Toprol XL 200mg  BID dose would be Metoprolol tartrate 100mg  BID  FYI for when you update actual rx on mar

## 2015-08-10 ENCOUNTER — Encounter: Payer: Self-pay | Admitting: Internal Medicine

## 2015-08-10 ENCOUNTER — Ambulatory Visit (INDEPENDENT_AMBULATORY_CARE_PROVIDER_SITE_OTHER): Payer: Self-pay | Admitting: Internal Medicine

## 2015-08-10 ENCOUNTER — Ambulatory Visit (HOSPITAL_COMMUNITY)
Admission: RE | Admit: 2015-08-10 | Discharge: 2015-08-10 | Disposition: A | Discharge status: Home / Self Care | Payer: Self-pay | Source: Ambulatory Visit | Attending: Internal Medicine | Admitting: Internal Medicine

## 2015-08-10 VITALS — BP 137/59 | HR 70 | Temp 98.2°F | Ht 63.5 in | Wt 157.7 lb

## 2015-08-10 DIAGNOSIS — Z8249 Family history of ischemic heart disease and other diseases of the circulatory system: Secondary | ICD-10-CM

## 2015-08-10 DIAGNOSIS — J309 Allergic rhinitis, unspecified: Secondary | ICD-10-CM

## 2015-08-10 DIAGNOSIS — J302 Other seasonal allergic rhinitis: Secondary | ICD-10-CM

## 2015-08-10 DIAGNOSIS — I10 Essential (primary) hypertension: Secondary | ICD-10-CM | POA: Insufficient documentation

## 2015-08-10 DIAGNOSIS — D649 Anemia, unspecified: Secondary | ICD-10-CM

## 2015-08-10 LAB — GLUCOSE, CAPILLARY: Glucose-Capillary: 92 mg/dL (ref 65–99)

## 2015-08-10 LAB — POCT GLYCOSYLATED HEMOGLOBIN (HGB A1C): Hemoglobin A1C: 6

## 2015-08-10 MED ORDER — FEXOFENADINE HCL 180 MG PO TABS: 180.0000 mg | ORAL_TABLET | Freq: Every day | ORAL | Status: DC

## 2015-08-10 NOTE — Assessment & Plan Note (Addendum)
Overview Blood pressure today was 137/59. He reports taking metoprolol succinate 200 mg daily and olmesartan 20 mg daily. He denies any dizziness associated with these medications.  Assessment Essential hypertension, well controlled.  Plan Continue metoprolol succinate 200 mg daily and olmesartan 20 mg daily   ADDENDUM 08/11/2015  4:59 PM:  Creatinine 1.4, up from 0.97 when checked one year ago. Per review the records, he had exercise-induced dehydration at which time his antihypertensives were readjusted last year when he had a similar issue. I just spoke with him over the phone and he reports being he is having intermittent nocturnal cramps though does not appear to have any other electrolyte abnormalities. AST/ALT is also reassuring for no statin-induced myopathy. I have asked him to stop taking his Benicar until he can repeat his BMET. I will also check creatinine kinase.  ADDENDUM 08/16/2015  5:33 PM:  Creatinine normalized today off of olmesartan. No elevations in his creatinine kinase account for the elevated creatinine value last week.  Conveyed the results to the patient and instructed him to resume olmesartan. Suspect he may be overmedicated with metoprolol succinate 200 mg. He is currently out of this medication, so I instructed him to stay off of it until he can follow-up next week to be seen by provider and have a repeat BMET to ensure that his creatinine increases appropriately. Denies any history of myocardial infarction, essential tremor, palpitations, arrhythmia. I instructed him to give us a call back if he were to develop any worsening tachycardia or palpitations off of the metoprolol succinate to which he expressed understanding.  Per review of the chart, I found a note dated 11/24/2006 which comments on tachycardia as an indication for his metoprolol, "Pateint is known to our clinic with sinus tachycardia. I think it is related to white coat stress. After the patient relaxes in  the room for 5-10 minutes his tachycardia resolves. Previous EKGs showed normal sinus rhythm." EKG at his appointment last week was also reassuring.

## 2015-08-10 NOTE — Assessment & Plan Note (Addendum)
Overview 2 months ago, he reports that his brother of 57 years of age passed away tragically from a massive heart attack. He also reports his sister had a heart attack at the age of 57 as well as both of his parents in their 170s. He denies any exertional dyspnea or chest pain though would like for us to consider him for further cardiac evaluation.   His risk stratification is as follows. Lipid panel 03/03/2014 was notable for total cholesterol 238, triglycerides 145, HDL 54, LDL 155. He is currently on Crestor 10 mg at bedtime. He quit smoking 2 years ago and is now using a e-cigarette. A1c was 6.0 when checked back in 2014.  Assessment At risk for coronary artery disease as indicated by significant family history and hyperlipidemia. Per 2013 AHA/ACC ASCVD risk calculator, 10-year risk is 10.7 % and 4.8 % with optimal risk factors. Stain therapy is thus indicated with moderate to high intensity dosage.   Plan -Recheck CMET, CBC, lipid panel A1c, TSH today to establish a baseline -Order EKG as most recent one is in 2010 and unavailable -Commended him for quitting smoking altogether and encouraged him to stay off cigarettes for as long as possible  ADDENDUM 08/10/2015  5:40 PM:  EKG with normal sinus rhythm and rate. Mild left axis deviation though of unclear significance.

## 2015-08-10 NOTE — Patient Instructions (Signed)
General Instructions:   Please bring your medicines with you each time you come to clinic.  Medicines may include prescription medications, over-the-counter medications, herbal remedies, eye drops, vitamins, or other pills.  For your allergies, only take Nasonex 1-2 sprays per nostril daily. Stop taking any of the other tablets and try Allegra 1 tablet daily. You'll need to get your orange card paperwork finalized before we can refer you to an allergy doctor.  I will call you if any of your blood work comes back abnormal.   Progress Toward Treatment Goals:  Treatment Goal 02/09/2015  Blood pressure deteriorated  Stop smoking -    Self Care Goals & Plans:  Self Care Goal 08/10/2015  Manage my medications take my medicines as prescribed; bring my medications to every visit; refill my medications on time  Monitor my health keep track of my blood pressure  Eat healthy foods eat more vegetables; eat foods that are low in salt; eat baked foods instead of fried foods  Be physically active find an activity I enjoy  Stop smoking -    No flowsheet data found.   Care Management & Community Referrals:  Referral 07/14/2014  Referrals made for care management support -  Referrals made to community resources smoking cessation

## 2015-08-10 NOTE — Progress Notes (Signed)
   Subjective:    Patient ID: Shaune Pascalhillip K Riester, male    DOB: 1956-07-06, 59 y.o.   MRN: [id number]  HPI [name] is a 59 year old male with hypertension, GERD, COPD Gold stage B, ongoing alcohol use, erectile dysfunction, hyperlipidemia who presents today for nasal congestion. Please see assessment & plan for status of chronic medical problems.  Review of Systems  HENT: Positive for congestion, hearing loss and rhinorrhea. Negative for postnasal drip.   Eyes: Negative for itching.  Respiratory: Negative for cough and shortness of breath.   Cardiovascular: Negative for chest pain and leg swelling.  Neurological: Positive for headaches. Negative for dizziness.       Objective:   Physical Exam  Constitutional: He is oriented to person, place, and time. He appears well-developed and well-nourished.  HENT:  Head: Normocephalic and atraumatic.  Mouth/Throat: Oropharynx is clear and moist. No oropharyngeal exudate.  Poor dentition.  Eyes: Conjunctivae and EOM are normal. Pupils are equal, round, and reactive to light.  Neck: Normal range of motion. Neck supple. No thyromegaly present.  Cardiovascular: Normal rate, regular rhythm and normal heart sounds.  Exam reveals no gallop and no friction rub.   No murmur heard. No carotid bruits.  Pulmonary/Chest: Effort normal and breath sounds normal. No respiratory distress. He has no wheezes. He has no rales.  Musculoskeletal: Normal range of motion.  Neurological: He is alert and oriented to person, place, and time.  Skin: Skin is warm and dry. No rash noted. No erythema.  Psychiatric: He has a normal mood and affect. His behavior is normal.          Assessment & Plan:

## 2015-08-10 NOTE — Assessment & Plan Note (Signed)
Overview He was seen in clinic on 8/16 and complained of congestion and cough productive of green sputum which was felt to be consistent with subacute bacterial sinusitis. He also reported prior history of sinus infections. He was sent home with 5 day course of Augmentin. He then presented again on 9/22 for nasal congestion also associated with runny nose, postnasal drip, nonproductive cough, sinus pressure headaches and was then felt to be consistent with a viral upper respiratory infection. He was then instructed to use Afrin 3 days along with Nasonex, Coricidin DM.  Today, he reports no improvement in his symptoms. Primarily complains of nasal congestion which is complicated by sinus pressure and associated with some rhinorrhea with intermittent running of his eyes. He denies any cough, itching either of the skin or in his nose, postnasal drip, fevers, chills. He lives at home with his mother and they do not have any pets. He has never been to an allergist before. He denies any prior history of sinus procedures. He currently works at a [employer] and has done so for the past year. Medications he takes include his Nasonex spray per nostril twice daily. He does not report any improvement in his symptoms with Zyrtec, Claritin, chlorphentermine which was prescribed as last visit.  Assessment Suspect his ongoing symptoms are the result of underlying allergies. He is approaching maximal medical therapy and could benefit from an evaluation by an allergist. I do not think he has an ongoing infection in the absence of fever, purulent cough.  Plan -Discontinue chlorphentermine and desloratadine in favor of fexofenadine since it is the only medication left in this class -Continue Nasonex 1 spray per nostril twice daily -Also suggested the EchoStareti Pot though the patient was not interested

## 2015-08-11 LAB — CMP14 + ANION GAP
ALBUMIN: 4.2 g/dL (ref 3.5–5.5)
ALK PHOS: 78 IU/L (ref 39–117)
ALT: 22 IU/L (ref 0–44)
ANION GAP: 18 mmol/L (ref 10.0–18.0)
AST: 20 IU/L (ref 0–40)
Albumin/Globulin Ratio: 1.6 (ref 1.1–2.5)
BUN / CREAT RATIO: 15 (ref 9–20)
BUN: 21 mg/dL (ref 6–24)
Bilirubin Total: <0.2 mg/dL (ref 0.0–1.2)
CALCIUM: 9.4 mg/dL (ref 8.7–10.2)
CO2: 22 mmol/L (ref 18–29)
CREATININE: 1.39 mg/dL — AB (ref 0.76–1.27)
Chloride: 101 mmol/L (ref 97–106)
GFR calc Af Amer: 65 mL/min/{1.73_m2} (ref >59)
GFR, EST NON AFRICAN AMERICAN: 56 mL/min/{1.73_m2} — AB (ref >59)
Globulin, Total: 2.7 g/dL (ref 1.5–4.5)
Glucose: 92 mg/dL (ref 65–99)
Potassium: 4.7 mmol/L (ref 3.5–5.2)
SODIUM: 141 mmol/L (ref 136–144)
Total Protein: 6.9 g/dL (ref 6.0–8.5)

## 2015-08-11 LAB — CBC
Hematocrit: 36.6 % — ABNORMAL LOW (ref 37.5–51.0)
Hemoglobin: 12.5 g/dL — ABNORMAL LOW (ref 12.6–17.7)
MCH: 30.6 pg (ref 26.6–33.0)
MCHC: 34.2 g/dL (ref 31.5–35.7)
MCV: 90 fL (ref 79–97)
PLATELETS: 259 10*3/uL (ref 150–379)
RBC: 4.09 x10E6/uL — ABNORMAL LOW (ref 4.14–5.80)
RDW: 13.3 % (ref 12.3–15.4)
WBC: 9.4 10*3/uL (ref 3.4–10.8)

## 2015-08-11 LAB — LIPID PANEL
CHOLESTEROL TOTAL: 127 mg/dL (ref 100–199)
Chol/HDL Ratio: 2.9 ratio units (ref 0.0–5.0)
HDL: 44 mg/dL (ref >39)
LDL Calculated: 53 mg/dL (ref 0–99)
TRIGLYCERIDES: 152 mg/dL — AB (ref 0–149)
VLDL CHOLESTEROL CAL: 30 mg/dL (ref 5–40)

## 2015-08-11 LAB — TSH: TSH: 2.16 u[IU]/mL (ref 0.450–4.500)

## 2015-08-11 NOTE — Addendum Note (Signed)
Addended by: Beather ArbourPATEL, Arne Schlender V on: 08/11/2015 05:01 PM   Modules accepted: Orders

## 2015-08-16 ENCOUNTER — Other Ambulatory Visit (INDEPENDENT_AMBULATORY_CARE_PROVIDER_SITE_OTHER): Payer: Self-pay

## 2015-08-16 ENCOUNTER — Other Ambulatory Visit: Payer: Self-pay | Admitting: Internal Medicine

## 2015-08-16 ENCOUNTER — Ambulatory Visit: Payer: Self-pay

## 2015-08-16 DIAGNOSIS — I10 Essential (primary) hypertension: Secondary | ICD-10-CM

## 2015-08-16 DIAGNOSIS — D649 Anemia, unspecified: Secondary | ICD-10-CM | POA: Insufficient documentation

## 2015-08-16 LAB — BASIC METABOLIC PANEL
ANION GAP: 5 (ref 5–15)
BUN: 16 mg/dL (ref 6–20)
CALCIUM: 9.3 mg/dL (ref 8.9–10.3)
CHLORIDE: 107 mmol/L (ref 101–111)
CO2: 27 mmol/L (ref 22–32)
Creatinine, Ser: 1.02 mg/dL (ref 0.61–1.24)
GFR calc non Af Amer: >60 mL/min (ref >60)
GLUCOSE: 102 mg/dL — AB (ref 65–99)
POTASSIUM: 4.2 mmol/L (ref 3.5–5.1)
Sodium: 139 mmol/L (ref 135–145)

## 2015-08-16 LAB — CBC
HEMATOCRIT: 35.7 % — AB (ref 39.0–52.0)
Hemoglobin: 12 g/dL — ABNORMAL LOW (ref 13.0–17.0)
MCH: 31.2 pg (ref 26.0–34.0)
MCHC: 33.6 g/dL (ref 30.0–36.0)
MCV: 92.7 fL (ref 78.0–100.0)
PLATELETS: 210 10*3/uL (ref 150–400)
RBC: 3.85 MIL/uL — ABNORMAL LOW (ref 4.22–5.81)
RDW: 13.3 % (ref 11.5–15.5)
WBC: 7.1 10*3/uL (ref 4.0–10.5)

## 2015-08-16 LAB — CK TOTAL AND CKMB (NOT AT ARMC)
CK, MB: 3.1 ng/mL (ref 0.5–5.0)
Relative Index: 1.5 (ref 0.0–2.5)
Total CK: 205 U/L (ref 49–397)

## 2015-08-16 NOTE — Assessment & Plan Note (Addendum)
ADDENDUM 08/11/2015  4:59 PM:  Hemoglobin 13, down from 14 one year ago, with a drop in his hematocrit from 40.0 to 36.6 as well. He denies any signs of active bleeding, like hematochezia, hematuria, hematemesis, hemoptysis. We can also repeat CBC at follow-up. If abnormal, I wonder if he needs to be referred back to GI. Colonoscopy in 2011 was notable for one hyperplastic polyp with nonmalignant features.

## 2015-08-17 LAB — DIFFERENTIAL
BASOS ABS: 0 10*3/uL (ref 0.0–0.1)
BASOS PCT: 1 %
EOS ABS: 0.1 10*3/uL (ref 0.0–0.7)
Eosinophils Relative: 2 %
Lymphocytes Relative: 37 %
Lymphs Abs: 2.6 10*3/uL (ref 0.7–4.0)
MONOS PCT: 11 %
Monocytes Absolute: 0.8 10*3/uL (ref 0.1–1.0)
Neutro Abs: 3.5 10*3/uL (ref 1.7–7.7)
Neutrophils Relative %: 49 %

## 2015-08-17 LAB — RETICULOCYTES
RBC.: 3.89 MIL/uL — AB (ref 4.22–5.81)
RETIC COUNT ABSOLUTE: 66.1 10*3/uL (ref 19.0–186.0)
RETIC CT PCT: 1.7 % (ref 0.4–3.1)

## 2015-08-17 NOTE — Addendum Note (Signed)
Addended by: Beather ArbourPATEL, RUSHIL V on: 08/17/2015 08:51 AM   Modules accepted: Orders

## 2015-08-17 NOTE — Addendum Note (Signed)
Addended by: Bufford SpikesFULCHER, [place] Souter N on: 08/17/2015 09:05 AM   Modules accepted: Orders

## 2015-08-17 NOTE — Progress Notes (Signed)
Internal Medicine Clinic Attending  Case discussed with Dr. Patel,Rushil at the time of the visit.  We reviewed the resident's history and exam and pertinent patient test results.  I agree with the assessment, diagnosis, and plan of care documented in the resident's note.  

## 2015-08-23 ENCOUNTER — Ambulatory Visit: Payer: Self-pay

## 2015-08-31 ENCOUNTER — Other Ambulatory Visit: Payer: Self-pay | Admitting: *Deleted

## 2015-08-31 DIAGNOSIS — I10 Essential (primary) hypertension: Secondary | ICD-10-CM

## 2015-08-31 MED ORDER — OLMESARTAN MEDOXOMIL 20 MG PO TABS: 20.0000 mg | ORAL_TABLET | Freq: Every day | ORAL | Status: DC

## 2015-09-27 ENCOUNTER — Ambulatory Visit (INDEPENDENT_AMBULATORY_CARE_PROVIDER_SITE_OTHER): Payer: BLUE CROSS/BLUE SHIELD | Admitting: Internal Medicine

## 2015-09-27 ENCOUNTER — Encounter: Payer: Self-pay | Admitting: Internal Medicine

## 2015-09-27 DIAGNOSIS — Z79899 Other long term (current) drug therapy: Secondary | ICD-10-CM | POA: Diagnosis not present

## 2015-09-27 DIAGNOSIS — J302 Other seasonal allergic rhinitis: Secondary | ICD-10-CM

## 2015-09-27 DIAGNOSIS — I10 Essential (primary) hypertension: Secondary | ICD-10-CM

## 2015-09-27 DIAGNOSIS — J349 Unspecified disorder of nose and nasal sinuses: Secondary | ICD-10-CM | POA: Diagnosis not present

## 2015-09-27 MED ORDER — PSEUDOEPHEDRINE HCL 30 MG PO TABS: 30.0000 mg | ORAL_TABLET | Freq: Three times a day (TID) | ORAL | Status: DC | PRN

## 2015-09-27 MED ORDER — OLMESARTAN MEDOXOMIL 20 MG PO TABS: 20.0000 mg | ORAL_TABLET | Freq: Every day | ORAL | Status: DC

## 2015-09-27 NOTE — Patient Instructions (Signed)
1. Use DISTILLED in Neti Pot once daily for sinus congestion. 2. Take Pseudoephedrine 30 mg up to three times daily for sinus congestion.  It is behind the counter at the pharmacy. 3. Follow up with the allergist about your symptoms.  Allergic Rhinitis Allergic rhinitis is when the mucous membranes in the nose respond to allergens. Allergens are particles in the air that cause your body to have an allergic reaction. This causes you to release allergic antibodies. Through a chain of events, these eventually cause you to release histamine into the blood stream. Although meant to protect the body, it is this release of histamine that causes your discomfort, such as frequent sneezing, congestion, and an itchy, runny nose.  CAUSES Seasonal allergic rhinitis (hay fever) is caused by pollen allergens that may come from grasses, trees, and weeds. Year-round allergic rhinitis (perennial allergic rhinitis) is caused by allergens such as house dust mites, pet dander, and mold spores. SYMPTOMS  Nasal stuffiness (congestion).  Itchy, runny nose with sneezing and tearing of the eyes. DIAGNOSIS Your health care provider can help you determine the allergen or allergens that trigger your symptoms. If you and your health care provider are unable to determine the allergen, skin or blood testing may be used. Your health care provider will diagnose your condition after taking your health history and performing a physical exam. Your health care provider may assess you for other related conditions, such as asthma, pink eye, or an ear infection. TREATMENT Allergic rhinitis does not have a cure, but it can be controlled by:  Medicines that block allergy symptoms. These may include allergy shots, nasal sprays, and oral antihistamines.  Avoiding the allergen. Hay fever may often be treated with antihistamines in pill or nasal spray forms. Antihistamines block the effects of histamine. There are over-the-counter medicines  that may help with nasal congestion and swelling around the eyes. Check with your health care provider before taking or giving this medicine. If avoiding the allergen or the medicine prescribed do not work, there are many new medicines your health care provider can prescribe. Stronger medicine may be used if initial measures are ineffective. Desensitizing injections can be used if medicine and avoidance does not work. Desensitization is when a patient is given ongoing shots until the body becomes less sensitive to the allergen. Make sure you follow up with your health care provider if problems continue. HOME CARE INSTRUCTIONS It is not possible to completely avoid allergens, but you can reduce your symptoms by taking steps to limit your exposure to them. It helps to know exactly what you are allergic to so that you can avoid your specific triggers. SEEK MEDICAL CARE IF:  You have a fever.  You develop a cough that does not stop easily (persistent).  You have shortness of breath.  You start wheezing.  Symptoms interfere with normal daily activities.   This information is not intended to replace advice given to you by your health care provider. Make sure you discuss any questions you have with your health care provider.   Document Released: 05/14/2001 Document Revised: 09/09/2014 Document Reviewed: 04/26/2013 Elsevier Interactive Patient Education [employer]

## 2015-09-27 NOTE — Progress Notes (Signed)
Patient ID: KEVRON PATELLA, male   DOB: October 05, 1955, 60 y.o.   MRN: [id number]   Subjective:   Patient ID: Cadan Maggart [place] male   DOB: 07/27/1956 60 y.o.   MRN: [id number]  HPI: Mr.Kimo K Hogate is a 60 y.o. male with PMH as below, here for f/u runny nose and sinus congestion.  Please see Problem-Based charting for the status of the patient's chronic medical issues.  He was seen in clinic on 8/16 and complained of congestion and cough productive of green sputum which was felt to be consistent with subacute bacterial sinusitis. He also reported prior history of sinus infections. He was sent home with 5 day course of Augmentin. He then presented again on 9/22 for nasal congestion also associated with runny nose, postnasal drip, nonproductive cough, sinus pressure headaches and was then felt to be consistent with a viral upper respiratory infection. He was then instructed to use Afrin 3 days along with Nasonex, Coricidin DM.  At followup reported no improvement in his symptoms. Primarily complains of nasal congestion which is complicated by sinus pressure and associated with some rhinorrhea with intermittent running of his eyes. He denies any cough, itching either of the skin or in his nose, postnasal drip, fevers, chills. He lives at home with his mother and they do not have any pets. He has never been to an allergist before. He denies any prior history of sinus procedures. He currently works at a [employer] and has done so for the past year. Medications he takes include his Nasonex spray per nostril twice daily. He does not report any improvement in his symptoms with Zyrtec, Claritin, chlorpheniramine.  Today he reports continued runny nose, left nare > right nare.  It is usually running with green sputum.  He has an intermitent cough of green sputum.  He has had no benefit from any medications he has tried.  Nasonex does not help his runny nose but does decrease his sore throat.  His  runny nose is worse in cold temperatures.  He has a h/o dry skin on the back of his knees for which he used Clobetasol cream PRN.  He has a h/o acid reflux. He works for [employer].  He does not directly use any chemicals but does work around them.  He denies fever, chills, HA, or chest pain.  He has chronic SOB 2/2 COPD.   Past Medical History  Diagnosis Date  . GERD (gastroesophageal reflux disease)   . Hypertension   . Hyperlipidemia   . COPD (chronic obstructive pulmonary disease) (HCC)     Questionable diagnosis.  . 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 Myalgia 11/2007    Likely 2/2 Pravachol  . Dyshidrotic eczema   . Tobacco abuse   . Allergic rhinitis    Current Outpatient Prescriptions  Medication Sig Dispense Refill  . albuterol (PROVENTIL HFA;VENTOLIN HFA) 108 (90 BASE) MCG/ACT inhaler Inhale 2 puffs into the lungs every 6 (six) hours as needed for wheezing. 1 Inhaler 6  . clobetasol ointment (TEMOVATE) 0.05 % Apply topically 2 (two) times daily as needed. Apply to the itchy areas on legs twice a day for seven days 60 g 3  . Dexlansoprazole (DEXILANT) 30 MG capsule Take 1 capsule (30 mg total) by mouth daily. 30 capsule 11  . fexofenadine (ALLEGRA ALLERGY) 180 MG tablet Take 1 tablet (180 mg total) by mouth daily. 30 tablet 0  . Fluticasone-Salmeterol (ADVAIR) 250-50  MCG/DOSE AEPB Inhale 1 puff into the lungs 2 (two) times daily. 1 each 11  . meloxicam (MOBIC) 15 MG tablet Take one tablet daily for 7 days, then take as needed for pain 60 tablet 0  . metoprolol (TOPROL-XL) 200 MG 24 hr tablet Take 1 tablet (200 mg total) by mouth daily. 90 tablet 3  . mometasone (NASONEX) 50 MCG/ACT nasal spray Place 2 sprays into the nose daily. One in each nostril 17 g 11  . olmesartan (BENICAR) 20 MG tablet Take 1 tablet (20 mg total) by mouth daily. 90 tablet 3  . rosuvastatin (CRESTOR) 10 MG tablet Take 1 tablet (10 mg total) by mouth at bedtime. 30 tablet 11    . sildenafil (VIAGRA) 100 MG tablet Take 1 tablet (100 mg total) by mouth as needed for erectile dysfunction. 30 tablet 5   No current facility-administered medications for this visit.   Family History  Problem Relation Age of Onset  . Heart disease Sister   . Heart attack Sister     34-35yo  . Heart disease Father    Social History   Social History  . Marital Status: Single    Spouse Name: N/A  . Number of Children: N/A  . Years of Education: N/A   Social History Main Topics  . Smoking status: Former Smoker -- 1.50 packs/day for 40 years    Types: Cigarettes    Start date: 06/03/1983    Quit date: 03/04/2013  . Smokeless tobacco: Never Used     Comment: Uses a Vapor.  . Alcohol Use: 0.0 oz/week    0 Standard drinks or equivalent per week     Comment: Drink beer.  . Drug Use: No  . Sexual Activity: Not on file   Other Topics Concern  . Not on file   Social History Narrative   Financial assistance approved for 100% discount at [facility] and has [facility] card per Rudell Cobb October 13,2011 4:21PM   Review of Systems: Pertinent items are noted in HPI. Objective:  Physical Exam: There were no vitals filed for this visit. Physical Exam  Constitutional: He is oriented to person, place, and time and well-developed, well-nourished, and in no distress. No distress.  HENT:  Head: Normocephalic and atraumatic.  No rhinorrhea.  Nares white.  Neck: No JVD present. No tracheal deviation present.  Cardiovascular: Normal rate, regular rhythm and normal heart sounds.   Pulmonary/Chest: Breath sounds normal. No stridor. No respiratory distress. He has no wheezes.  Lymphadenopathy:    He has no cervical adenopathy.  Neurological: He is alert and oriented to person, place, and time.  Skin: Skin is warm and dry. He is not diaphoretic.     Assessment & Plan:   Patient and case were discussed with Dr. Oswaldo Done.  Please refer to Problem Based charting for further documentation.

## 2015-09-27 NOTE — Assessment & Plan Note (Signed)
BP Readings from Last 3 Encounters:  08/10/15 137/59  05/25/15 156/74  04/18/15 134/80    Lab Results  Component Value Date   NA 139 08/16/2015   K 4.2 08/16/2015   CREATININE 1.02 08/16/2015    Assessment: Blood pressure control:  good Progress toward BP goal:    Comments: Patient is not taking Metoprolol.  He is only taking Olmesartan  Plan: Medications:  continue current medications, Olmesartan Educational resources provided:   Self management tools provided:   Other plans: recheck BP in 3 months

## 2015-09-27 NOTE — Assessment & Plan Note (Signed)
A/P: Allergic rhinitis.  I agree with Dr. Eliane Decree very thorough overview and assessment. The patient does not have any symptoms concerning for infectious etiology.  He has no h/o asthma, but does have symptoms consistent with eczema.  Therefore, his rhinitis is likely allergic in nature.  It is very surprising that he has not responded to any medications.  He states it is worse in cold temperatures, and there may not be a specific allergen which can be targeted.  Patient would definitely benefit from allergist evaluation for further management.  Will continue to treat symptomatically until patient follows up Allergy/Immunology. - Neti pot with distilled water daily - Pseudoephedrine 30 mg q8h PRN - Allergy f/u

## 2015-09-28 NOTE — Progress Notes (Signed)
Internal Medicine Clinic Attending  Case discussed with Dr. Taylor at the time of the visit.  We reviewed the resident's history and exam and pertinent patient test results.  I agree with the assessment, diagnosis, and plan of care documented in the resident's note. 

## 2015-10-23 ENCOUNTER — Ambulatory Visit (INDEPENDENT_AMBULATORY_CARE_PROVIDER_SITE_OTHER): Payer: BLUE CROSS/BLUE SHIELD | Admitting: Sports Medicine

## 2015-10-23 ENCOUNTER — Encounter: Payer: Self-pay | Admitting: Sports Medicine

## 2015-10-23 DIAGNOSIS — M25512 Pain in left shoulder: Secondary | ICD-10-CM

## 2015-10-23 DIAGNOSIS — M25511 Pain in right shoulder: Secondary | ICD-10-CM | POA: Diagnosis not present

## 2015-10-23 MED ORDER — METHYLPREDNISOLONE ACETATE 40 MG/ML IJ SUSP
40.0000 mg | Freq: Once | INTRAMUSCULAR | Status: AC
  Administered 2015-10-23: 40 mg via INTRA_ARTICULAR

## 2015-10-23 MED ORDER — METHOCARBAMOL 500 MG PO TABS: 500.0000 mg | ORAL_TABLET | Freq: Every evening | ORAL | Status: DC | PRN

## 2015-10-23 NOTE — Assessment & Plan Note (Addendum)
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

## 2015-10-23 NOTE — Progress Notes (Signed)
   Subjective:    Patient ID: Samuel Chen, male    DOB: Jul 22, 1956, 60 y.o.   MRN: [id number]  HPI: Patient is a 61yo right-hand-dominant male who presents to clinic for recurrent bilateral shoulder pain. He was last seen in 11/20115 and received bilateral subacromial injections, which helped relieve his pain for about 6-8 months. His pain has returned and has been present for at least 6 months, worse than previously. He reports shoulder aching soreness and painful ROM, left greater than right, especially at night, which interferes with his sleep. He has taken nsaids in the past, with little to no relief and is not taking any medications for this now. He requests repeat cortisone injections, if possible. He denies recent trauma or new radiculopathy-type symptoms.  Review of Systems: As above. Feels well, otherwise.     Objective:   Physical Exam BP 136/79 mmHg  Pulse 100  Ht  (1.626 m)  Wt 150 lb (68.04 kg)  BMI 25.73 kg/m2 Gen: well-appearing adult male in NAD  MSK: bilateral shoulders normal to inspection  Marked tenderness to bilateral shoulders diffusely over bony prominences, left slightly worse than right  ROM markedly reduced due to pain in forward flexion and abduction of the left shoulder; unable to place hand behind head without significant pain  ROM of right shoulder similar but less marked; able to place hand behind head and at small of back on the right  Pain reproduced with resisted internal and external rotation of bilateral shoulders    Neurovascular: alert, oriented, sensation of bilateral hands grossly intact  Grip strength 5/5 bilaterally  Normal capillary refill to both hands     Assessment & Plan:  Pain in joint, shoulder region Shoulder pain 3 several years, L>R, responded well to subacromial CSI in the past, also with night pain and cramping across upper back, diffuse pain and tenderness throughout shoulder region, ROM significantly limited by pain -  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 I agree with the above plan of care. Patient has done very well with previous subacromial cortisone injections. We will repeat those today. Follow-up as needed.  Consent obtained and verified. Time-out conducted. Noted no overlying erythema, induration, or other signs of local infection. Skin prepped in a sterile fashion. Topical analgesic spray: Ethyl chloride. Joint: right shoulder (subacromial) Needle: 25g 1.5 inch Completed without difficulty. Meds: 3cc 1% xylocaine, 1cc ( ) depomdrol  Advised to call if fevers/chills, erythema, induration, drainage, or persistent bleeding.  Consent obtained and verified. Time-out conducted. Noted no overlying erythema, induration, or other signs of local infection. Skin prepped in a sterile fashion. Topical analgesic spray: Ethyl chloride. Joint: left shoulder (subacromial) Needle: 25g 1.5 inch Completed without difficulty. Meds: 3cc 1% xylocaine, 1cc ( ) depomedrol  Advised to call if fevers/chills, erythema, induration, drainage, or persistent bleeding.

## 2015-10-24 ENCOUNTER — Ambulatory Visit (INDEPENDENT_AMBULATORY_CARE_PROVIDER_SITE_OTHER): Payer: BLUE CROSS/BLUE SHIELD | Admitting: Allergy and Immunology

## 2015-10-24 VITALS — HR 96 | Temp 98.3°F | Resp 20 | Ht 63.39 in | Wt 153.2 lb

## 2015-10-24 DIAGNOSIS — J329 Chronic sinusitis, unspecified: Secondary | ICD-10-CM | POA: Diagnosis not present

## 2015-10-24 DIAGNOSIS — H1010 Acute atopic conjunctivitis, unspecified eye: Secondary | ICD-10-CM | POA: Diagnosis not present

## 2015-10-24 DIAGNOSIS — J309 Allergic rhinitis, unspecified: Secondary | ICD-10-CM | POA: Diagnosis not present

## 2015-10-24 DIAGNOSIS — J4541 Moderate persistent asthma with (acute) exacerbation: Secondary | ICD-10-CM | POA: Diagnosis not present

## 2015-10-24 DIAGNOSIS — J387 Other diseases of larynx: Secondary | ICD-10-CM | POA: Diagnosis not present

## 2015-10-24 DIAGNOSIS — K219 Gastro-esophageal reflux disease without esophagitis: Secondary | ICD-10-CM

## 2015-10-24 MED ORDER — MONTELUKAST SODIUM 10 MG PO TABS: ORAL_TABLET | ORAL | Status: DC

## 2015-10-24 MED ORDER — AMOXICILLIN-POT CLAVULANATE 875-125 MG PO TABS: ORAL_TABLET | ORAL | Status: DC

## 2015-10-24 MED ORDER — METHYLPREDNISOLONE ACETATE 80 MG/ML IJ SUSP
80.0000 mg | Freq: Once | INTRAMUSCULAR | Status: AC
  Administered 2015-10-24: 80 mg via INTRAMUSCULAR

## 2015-10-24 NOTE — Patient Instructions (Addendum)
  1. Allergen avoidance measures  2. Treat and prevent inflammation:   A. continue Advair 250/50 one inhalation twice a day  B. continue Nasonex one spray each nostril twice a day  C. montelukast 10 mg one tablet daily  D. Depo-Medrol 80 IM delivered in clinic today  3. Treat infection:   A. Augmentin 875 one tablet twice a day for the next 20 days  4. If needed:   A. Proventil HFA 2 puffs every 4-6 hours  B. nasal saline wash  C. over-the-counter antihistamine - Zyrtec/Allegra/Claritin  5. Treat reflux by continuing on the Dexilant and tapering off caffeine and chocolate  6. Return to clinic in 4 weeks or earlier if problem  7. Annual fall flu vaccine every year

## 2015-10-24 NOTE — Progress Notes (Signed)
Dear Dr.Taylor,  Thank you for referring Samuel Chen to the [facility] [place] on 10/24/2015.   Below is a summation of this patient's evaluation and recommendations.  Thank you for your referral. I will keep you informed about this patient's response to treatment.   If you have any questions please to do hestitate to contact me.   Sincerely,  Jessica Priest, MD [facility]   ______________________________________________________________________    NEW PATIENT NOTE  Referring Provider: Jana Half, MD Primary Provider: Laban Emperor, MD Date of office visit: 10/24/2015    Subjective:   Chief Complaint:  Samuel Chen (DOB: 22-Apr-1956) is a 60 y.o. male with a chief complaint of New Patient (Initial Visit)  who presents to the clinic on 10/24/2015 with the following problems:  HPI Comments: Samuel Chen presents to this clinic in evaluation of respiratory tract problems. He has a long history of COPD/asthma and allergic rhinitis that was addressed by Dr. Delford Field years ago for which she's been placed on Advair and Nasonex with a relatively good response. His requirement for short acting bronchodilators is very rare and it does not sound as though he requires a systemic steroid in the treatment of asthma exacerbations. He has minimal problems with his nose most of the year and rarely uses an over-the-counter antihistamine and does not have a history of recurrent sinusitis.  However, Samuel Chen started to develop green nasal discharge about 3 months ago. This nasal discharge appeared to emanate from his left nostril. He did not have any associated anosmia or headache or vision changes or any other respiratory tract problems. Unfortunately, he has progressed to the point over the course of the past 2 weeks or so where he is developed coughing and wheezing and is very short of breath and has been  using a short acting bronchodilator.  As well, Samuel Chen has a problems with reflux. He has very bad reflux up into his throat. When he uses a proton pump inhibitor it is under relatively good control. It should be noted that he drinks at least 40 ounces of caffeinated soda plus tea and chocolate on a daily basis   Past Medical History  Diagnosis Date  . GERD (gastroesophageal reflux disease)   . Hypertension   . Hyperlipidemia   . COPD (chronic obstructive pulmonary disease) (HCC)     Questionable diagnosis.  . 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 Myalgia 11/2007    Likely 2/2 Pravachol  . Dyshidrotic eczema   . Tobacco abuse   . Allergic rhinitis     Past Surgical History  Procedure Laterality Date  . Hand surgery  2002    right hand, tendon repair - tendons damanged at work    Outpatient Prescriptions Prior to Visit  Medication Sig Dispense Refill  . albuterol (PROVENTIL HFA;VENTOLIN HFA) 108 (90 BASE) MCG/ACT inhaler Inhale 2 puffs into the lungs every 6 (six) hours as needed for wheezing. 1 Inhaler 6  . clobetasol ointment (TEMOVATE) 0.05 % Apply topically 2 (two) times daily as needed. Apply to the itchy areas on legs twice a day for seven days 60 g 3  . Dexlansoprazole (DEXILANT) 30 MG capsule Take 1 capsule (30 mg total) by mouth daily. 30 capsule 11  . Fluticasone-Salmeterol (ADVAIR) 250-50 MCG/DOSE AEPB Inhale 1 puff into the lungs 2 (two) times daily. 1 each 11  . meloxicam (  MOBIC) 15 MG tablet Take one tablet daily for 7 days, then take as needed for pain 60 tablet 0  . mometasone (NASONEX) 50 MCG/ACT nasal spray Place 2 sprays into the nose daily. One in each nostril 17 g 11  . olmesartan (BENICAR) 20 MG tablet Take 1 tablet (20 mg total) by mouth daily. 90 tablet 3  . rosuvastatin (CRESTOR) 10 MG tablet Take 1 tablet (10 mg total) by mouth at bedtime. 30 tablet 11  . fexofenadine (ALLEGRA ALLERGY) 180 MG tablet Take 1 tablet (180 mg total) by mouth daily.  (Patient not taking: Reported on 10/24/2015) 30 tablet 0  . methocarbamol (ROBAXIN) 500 MG tablet Take 1 tablet (500 mg total) by mouth at bedtime as needed for muscle spasms. (Patient not taking: Reported on 10/24/2015) 60 tablet 0  . pseudoephedrine (SUDAFED) 30 MG tablet Take 1 tablet (30 mg total) by mouth every 8 (eight) hours as needed for congestion. 90 tablet 0  . sildenafil (VIAGRA) 100 MG tablet Take 1 tablet (100 mg total) by mouth as needed for erectile dysfunction. (Patient not taking: Reported on 10/24/2015) 30 tablet 5   No facility-administered medications prior to visit.    Meds ordered this encounter  Medications  . montelukast (SINGULAIR) 10 MG tablet    Sig: Take one tablet once a day.    Dispense:  30 tablet    Refill:  5  . amoxicillin-clavulanate (AUGMENTIN) 875-125 MG tablet    Sig: Take one tablet twice a day for 20 days.    Dispense:  40 tablet    Refill:  0  . methylPREDNISolone acetate (DEPO-MEDROL) injection 80 mg    Sig:     Allergies  Allergen Reactions  . Acetaminophen     REACTION: (causes liver problems)  . Amlodipine Other (See Comments)    Peripheral edema in BLE  . Aspirin     REACTION: bleeding/nose bleeds    Review of systems negative except as noted in HPI / PMHx or noted below:  ROS  Family History  Problem Relation Age of Onset  . Heart disease Sister   . Heart attack Sister     34-35yo  . Heart disease Father     Social History   Social History  . Marital Status: Single    Spouse Name: N/A  . Number of Children: N/A  . Years of Education: N/A   Occupational History  . Not on file.   Social History Main Topics  . Smoking status: Former Smoker -- 1.50 packs/day for 40 years    Types: Cigarettes    Start date: 06/03/1983    Quit date: 03/04/2013  . Smokeless tobacco: Never Used     Comment: Uses a Vapor.  . Alcohol Use: 0.0 oz/week    0 Standard drinks or equivalent per week     Comment: Drink beer.  . Drug Use: No    . Sexual Activity: Not on file   Other Topics Concern  . Not on file   Social History Narrative   Financial assistance approved for 100% discount at [facility] and has [facility] card per Rudell Cobb October 13,2011 4:21PM    Environmental and Social history  Lives in a house with a dry environment, no animals located inside the household, carpeting in the bedroom, no plastic on the bed or pillow, and no smokers located inside the household. 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 He is exposed to multiple caustic agents including sodium  hydrochloride, sodium by sulfite, and sodium hydroxide   Objective:   Filed Vitals:   10/24/15 1428  Pulse: 96  Temp: 98.3 F (36.8 C)  Resp: 20   Height: 5' 3.39" (161 cm) Weight: 153 lb 3.5 oz (69.5 kg)  Physical Exam  Constitutional: He is well-developed, well-nourished, and in no distress.  Nasal voice and coughing  HENT:  Head: Normocephalic. Head is without right periorbital erythema and without left periorbital erythema.  Right Ear: Tympanic membrane, external ear and ear canal normal.  Left Ear: Tympanic membrane, external ear and ear canal normal.  Nose: Mucosal edema (Erythematous) present. Rhinorrhea: Purulent.  Mouth/Throat: Oropharynx is clear and moist and mucous membranes are normal. No oropharyngeal exudate.  Eyes: Conjunctivae and lids are normal. Pupils are equal, round, and reactive to light.  Neck: Trachea normal. No tracheal deviation present. No thyromegaly present.  Cardiovascular: Normal rate, regular rhythm, S1 normal, S2 normal and normal heart sounds.   No murmur heard. Pulmonary/Chest: Effort normal. No stridor. No tachypnea. No respiratory distress. He has wheezes (End expiratory wheezing). He has no rales. He exhibits no tenderness.  Abdominal: Soft. He exhibits no distension and no mass. There is no hepatosplenomegaly. There is no tenderness. There is no rebound and no guarding.  Musculoskeletal: He  exhibits no edema or tenderness.  Lymphadenopathy:       Head (right side): No tonsillar adenopathy present.       Head (left side): No tonsillar adenopathy present.    He has no cervical adenopathy.    He has no axillary adenopathy.  Neurological: He is alert. Gait normal.  Skin: No rash noted. He is not diaphoretic. No erythema. No pallor. Nails show no clubbing.  Psychiatric: Mood and affect normal.     Diagnostics: Allergy skin tests were performed. He demonstrated hypersensitivity to house dust mite  Spirometry was performed and demonstrated an FEV1 of 2.34 @ 95 % of predicted. Following the administration of nebulized albuterol his FEV1 did not change significantly  The patient had an Asthma Control Test with the following results:  .     Assessment and Plan:    1. Asthma, not well controlled, moderate persistent, with acute exacerbation   2. Allergic rhinoconjunctivitis   3. LPRD (laryngopharyngeal reflux disease)   4. Chronic sinusitis, unspecified location      1. Allergen avoidance measures  2. Treat and prevent inflammation:   A. continue Advair 250/50 one inhalation twice a day  B. continue Nasonex one spray each nostril twice a day  C. montelukast 10 mg one tablet daily  D. Depo-Medrol 80 IM delivered in clinic today  3. Treat infection:   A. Augmentin 875 one tablet twice a day for the next 20 days  4. If needed:   A. Proventil HFA 2 puffs every 4-6 hours  B. nasal saline wash  C. over-the-counter antihistamine - Zyrtec/Allegra/Claritin  5. Treat reflux by continuing on the Dexilant and tapering off caffeine and chocolate  6. Return to clinic in 4 weeks or earlier if problem  7. Annual fall flu vaccine every year  Samuel Chen appears to have a component of atopic respiratory inflammation affecting both his upper and lower airways that should respond to a combination of allergen avoidance measures and consistently use of anti-inflammatory medications  administered to his respiratory tract. As well, he unfortunately appears to have complicated his upper airway disease by developing a prolonged episode of sinusitis for which we'll have him use Augmentin for the next 20  days. As well, he does appear to have a component of LPR and I've informed him that he will probably do much better with his reflux disease if he can taper off his caffeine and chocolate consumption. I'll regroup with him in 4 weeks and we'll make a decision about how to proceed pending his response to the therapy mentioned above.   Jessica Priest, MD [facility] [place]

## 2015-10-25 ENCOUNTER — Encounter: Payer: Self-pay | Admitting: Allergy and Immunology

## 2015-11-15 ENCOUNTER — Other Ambulatory Visit: Payer: Self-pay | Admitting: Internal Medicine

## 2015-11-15 DIAGNOSIS — J302 Other seasonal allergic rhinitis: Secondary | ICD-10-CM

## 2015-11-15 DIAGNOSIS — I10 Essential (primary) hypertension: Secondary | ICD-10-CM

## 2015-11-15 NOTE — Telephone Encounter (Signed)
[facility] [address] needs refills of nasonex 50mg ., benicar 20mg .

## 2015-11-16 MED ORDER — MOMETASONE FUROATE 50 MCG/ACT NA SUSP: 2.0000 | Freq: Every day | NASAL | Status: DC

## 2015-11-16 MED ORDER — OLMESARTAN MEDOXOMIL 20 MG PO TABS: 20.0000 mg | ORAL_TABLET | Freq: Every day | ORAL | Status: DC

## 2015-11-17 NOTE — Telephone Encounter (Signed)
Faxed to [facility]

## 2015-11-21 ENCOUNTER — Ambulatory Visit: Payer: BLUE CROSS/BLUE SHIELD | Admitting: Allergy and Immunology

## 2015-12-28 ENCOUNTER — Telehealth: Payer: Self-pay | Admitting: Internal Medicine

## 2015-12-28 NOTE — Telephone Encounter (Signed)
APPT. REMINDER CALL, LMTCB °

## 2015-12-29 ENCOUNTER — Ambulatory Visit (INDEPENDENT_AMBULATORY_CARE_PROVIDER_SITE_OTHER): Payer: BLUE CROSS/BLUE SHIELD | Admitting: Internal Medicine

## 2015-12-29 ENCOUNTER — Encounter: Payer: Self-pay | Admitting: Internal Medicine

## 2015-12-29 VITALS — BP 106/62 | HR 91 | Temp 98.3°F | Ht 63.0 in | Wt 153.6 lb

## 2015-12-29 DIAGNOSIS — N529 Male erectile dysfunction, unspecified: Secondary | ICD-10-CM | POA: Diagnosis not present

## 2015-12-29 DIAGNOSIS — M25511 Pain in right shoulder: Secondary | ICD-10-CM

## 2015-12-29 DIAGNOSIS — R252 Cramp and spasm: Secondary | ICD-10-CM | POA: Diagnosis not present

## 2015-12-29 DIAGNOSIS — M25512 Pain in left shoulder: Secondary | ICD-10-CM

## 2015-12-29 DIAGNOSIS — I10 Essential (primary) hypertension: Secondary | ICD-10-CM | POA: Diagnosis not present

## 2015-12-29 DIAGNOSIS — F528 Other sexual dysfunction not due to a substance or known physiological condition: Secondary | ICD-10-CM

## 2015-12-29 MED ORDER — METHOCARBAMOL 500 MG PO TABS: 1000.0000 mg | ORAL_TABLET | Freq: Every evening | ORAL | Status: DC | PRN

## 2015-12-29 MED ORDER — OLMESARTAN MEDOXOMIL 20 MG PO TABS: 20.0000 mg | ORAL_TABLET | Freq: Every day | ORAL | Status: DC

## 2015-12-29 MED ORDER — SILDENAFIL CITRATE 100 MG PO TABS: 100.0000 mg | ORAL_TABLET | ORAL | Status: DC | PRN

## 2015-12-29 NOTE — Assessment & Plan Note (Signed)
Patient asking for single pack Viagra.  Viagra reordered with pharmacy instructions to provide single packs if possible. - Viagra refilled.

## 2015-12-29 NOTE — Assessment & Plan Note (Signed)
Patient complaining of diffuse, intermittent muscle cramps. They have been occurring for 1.5 months and occur daily in different muscle groups, including back, legs, feet, hands, and chest.  They can wake him up from sleep.  They last until he is able to move and stretch.  He was given Robaxin 500 mg qHS by Dr. Micheline Chapman, but reports no benefit from that or from Aleve.  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  He is not on any new medications.  On exam, he has 5/5 strength in UE and LE.  No muscle tenderness to palpation.  A/P: Muscle cramps of unclear etiology.  Will check electrolytes and increase Robaxin. Patient would prefer not to take Robaxin in the morning, as he does drive heavy machinery at work.  Although he has been on Crestor for a while, he is willing to stop for a month to determine if it is playing a role.  As he is not having specifically proximal muscle weakness, will defer ESR testing for PMR. - BMP, Mg, CK - Robaxin 1000 mg qHS - Stop Crestor.

## 2015-12-29 NOTE — Progress Notes (Signed)
Patient ID: Shaune Pascalhillip K Greif, male   DOB: 1956/07/01, 60 y.o.   MRN: [id number]   Subjective:   Patient ID: Margarita Mailhillip K Gitlin male   DOB: 1956/07/01 60 y.o.   MRN: [id number]  HPI: Mr.Aleksandar K Vig is a 60 y.o. male with PMH as below, here for eval muscle cramps.  Please see Problem-Based charting for the status of the patient's chronic medical issues.     Past Medical History  Diagnosis Date  . GERD (gastroesophageal reflux disease)   . Hypertension   . Hyperlipidemia   . COPD (chronic obstructive pulmonary disease) (HCC)     Questionable diagnosis.  . 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 Myalgia 11/2007    Likely 2/2 Pravachol  . Dyshidrotic eczema   . Tobacco abuse   . Allergic rhinitis    Current Outpatient Prescriptions  Medication Sig Dispense Refill  . albuterol (PROVENTIL HFA;VENTOLIN HFA) 108 (90 BASE) MCG/ACT inhaler Inhale 2 puffs into the lungs every 6 (six) hours as needed for wheezing. 1 Inhaler 6  . amoxicillin-clavulanate (AUGMENTIN) 875-125 MG tablet Take one tablet twice a day for 20 days. 40 tablet 0  . clobetasol ointment (TEMOVATE) 0.05 % Apply topically 2 (two) times daily as needed. Apply to the itchy areas on legs twice a day for seven days 60 g 3  . Dexlansoprazole (DEXILANT) 30 MG capsule Take 1 capsule (30 mg total) by mouth daily. 30 capsule 11  . fexofenadine (ALLEGRA ALLERGY) 180 MG tablet Take 1 tablet (180 mg total) by mouth daily. (Patient not taking: Reported on 10/24/2015) 30 tablet 0  . Fluticasone-Salmeterol (ADVAIR) 250-50 MCG/DOSE AEPB Inhale 1 puff into the lungs 2 (two) times daily. 1 each 11  . meloxicam (MOBIC) 15 MG tablet Take one tablet daily for 7 days, then take as needed for pain 60 tablet 0  . methocarbamol (ROBAXIN) 500 MG tablet Take 1 tablet (500 mg total) by mouth at bedtime as needed for muscle spasms. (Patient not taking: Reported on 10/24/2015) 60 tablet 0  . mometasone (NASONEX) 50 MCG/ACT nasal spray Place 2  sprays into the nose daily. One in each nostril 51 g 3  . montelukast (SINGULAIR) 10 MG tablet Take one tablet once a day. 30 tablet 5  . olmesartan (BENICAR) 20 MG tablet Take 1 tablet (20 mg total) by mouth daily. 90 tablet 3  . pseudoephedrine (SUDAFED) 30 MG tablet Take 1 tablet (30 mg total) by mouth every 8 (eight) hours as needed for congestion. 90 tablet 0  . rosuvastatin (CRESTOR) 10 MG tablet Take 1 tablet (10 mg total) by mouth at bedtime. 30 tablet 11  . sildenafil (VIAGRA) 100 MG tablet Take 1 tablet (100 mg total) by mouth as needed for erectile dysfunction. (Patient not taking: Reported on 10/24/2015) 30 tablet 5   No current facility-administered medications for this visit.   Family History  Problem Relation Age of Onset  . Heart disease Sister   . Heart attack Sister     34-35yo  . Heart disease Father    Social History   Social History  . Marital Status: Single    Spouse Name: N/A  . Number of Children: N/A  . Years of Education: N/A   Social History Main Topics  . Smoking status: Former Smoker -- 1.50 packs/day for 40 years    Types: Cigarettes    Start date: 06/03/1983    Quit date: 03/04/2013  . Smokeless tobacco: Never Used  Comment: Uses a Vapor.  . Alcohol Use: 0.0 oz/week    0 Standard drinks or equivalent per week     Comment: Drink beer.  . Drug Use: No  . Sexual Activity: Not Asked   Other Topics Concern  . None   Social History Narrative   Financial assistance approved for 100% discount at [facility] and has [facility] card per Rudell Cobb October 13,2011 4:21PM   Review of Systems: No joint pains, rash, or muscle weakness. Objective:  Physical Exam: Filed Vitals:   12/29/15 1536  BP: 106/62  Pulse: 91  Temp: 98.3 F (36.8 C)  TempSrc: Oral  Height:  (1.6 m)  Weight: 153 lb 9.6 oz (69.673 kg)  SpO2: 98%   Physical Exam  Constitutional: He is oriented to person, place, and time and well-developed, well-nourished, and in no distress.  No distress.  HENT:  Head: Normocephalic and atraumatic.  Eyes: EOM are normal. No scleral icterus.  Neck: No tracheal deviation present.  Cardiovascular: Normal rate, regular rhythm and normal heart sounds.   Pulmonary/Chest: Effort normal and breath sounds normal. No stridor. No respiratory distress. He has no wheezes.  Musculoskeletal:  Muscles nontender to palpation. 5/5 strength bilaterally in UE and LE.  Neurological: He is alert and oriented to person, place, and time.  Skin: Skin is warm and dry. He is not diaphoretic.     Assessment & Plan:   Patient and case were discussed with Dr. Josem Kaufmann.  Please refer to Problem Based charting for further documentation.

## 2015-12-29 NOTE — Assessment & Plan Note (Signed)
BP well controlled. Patient requesting switch from [place] to generic for insurance purposes. - Olmesartan filled.

## 2015-12-29 NOTE — Patient Instructions (Signed)
1. STOP taking Crestor.  2. Take Robaxin 1000 mg (2 tabs) at night as needed. 3. We will check your labs. 4. Return to clinic in 2 months.  Methocarbamol tablets What is this medicine? METHOCARBAMOL (meth oh KAR ba mole) helps to relieve pain and stiffness in muscles caused by strains, sprains, or other injury to your muscles. This medicine may be used for other purposes; ask your health care provider or pharmacist if you have questions. What should I tell my health care provider before I take this medicine? They need to know if you have any of these conditions: -kidney disease -seizures -an unusual or allergic reaction to methocarbamol, other medicines, foods, dyes, or preservatives -pregnant or trying to get pregnant -breast-feeding How should I use this medicine? Take this medicine by mouth with a full glass of water. Follow the directions on the prescription label. Take your medicine at regular intervals. Do not take your medicine more often than directed. Talk to your pediatrician regarding the use of this medicine in children. Special care may be needed. Overdosage: If you think you have taken too much of this medicine contact a poison control center or emergency room at once. NOTE: This medicine is only for you. Do not share this medicine with others. What if I miss a dose? If you miss a dose, take it as soon as you can. If it is almost time for your next dose, take only the next dose. Do not take double or extra doses. What may interact with this medicine? -alcohol or medicines that contain alcohol -cholinesterase inhibitors like neostigmine, ambenonium, and pyridostigmine bromide -other medicines that cause drowsiness This list may not describe all possible interactions. Give your health care provider a list of all the medicines, herbs, non-prescription drugs, or dietary supplements you use. Also tell them if you smoke, drink alcohol, or use illegal drugs. Some items may interact  with your medicine. What should I watch for while using this medicine? You may get drowsy or dizzy. Do not drive, use machinery, or do anything that needs mental alertness until you know how this medicine affects you. Do not stand or sit up quickly, especially if you are an older patient. This reduces the risk of dizzy or fainting spells. Alcohol may interfere with the effect of this medicine. Avoid alcoholic drinks. What side effects may I notice from receiving this medicine? Side effects that you should report to your doctor or health care professional as soon as possible: -allergic reactions like skin rash, itching or hives, swelling of the face, lips, or tongue -blurred vision or changes in vision -confusion -fainting spells -fever -nausea or vomiting -seizures Side effects that usually do not require medical attention (report to your doctor or health care professional if they continue or are bothersome): -dizziness -drowsiness -headache -metallic taste This list may not describe all possible side effects. Call your doctor for medical advice about side effects. You may report side effects to FDA at 1-800-FDA-1088. Where should I keep my medicine? Keep out of the reach of children. Store at room temperature between 20 and 25 degrees C (68 and 77 degrees F). Keep container tightly closed. Throw away any unused medicine after the expiration date. NOTE: This sheet is a summary. It may not cover all possible information. If you have questions about this medicine, talk to your doctor, pharmacist, or health care provider.    2016, Elsevier/Gold Standard. (2008-03-07 16:02:03)

## 2015-12-30 LAB — BMP8+ANION GAP
Anion Gap: 19 mmol/L — ABNORMAL HIGH (ref 10.0–18.0)
BUN / CREAT RATIO: 10 (ref 9–20)
BUN: 17 mg/dL (ref 6–24)
CO2: 21 mmol/L (ref 18–29)
CREATININE: 1.64 mg/dL — AB (ref 0.76–1.27)
Calcium: 9.6 mg/dL (ref 8.7–10.2)
Chloride: 101 mmol/L (ref 96–106)
GFR calc Af Amer: 53 mL/min/{1.73_m2} — ABNORMAL LOW (ref >59)
GFR, EST NON AFRICAN AMERICAN: 46 mL/min/{1.73_m2} — AB (ref >59)
Glucose: 102 mg/dL — ABNORMAL HIGH (ref 65–99)
POTASSIUM: 5.2 mmol/L (ref 3.5–5.2)
SODIUM: 141 mmol/L (ref 134–144)

## 2015-12-30 LAB — CK: CK TOTAL: 222 U/L — AB (ref 24–204)

## 2015-12-30 LAB — MAGNESIUM: Magnesium: 1.9 mg/dL (ref 1.6–2.3)

## 2016-01-01 NOTE — Progress Notes (Signed)
Case discussed with Dr. Taylor at the time of the visit.  We reviewed the resident's history and exam and pertinent patient test results.  I agree with the assessment, diagnosis and plan of care documented in the resident's note. 

## 2016-01-01 NOTE — Addendum Note (Signed)
Addended by: Doneen PoissonKLIMA, Delancey Moraes D on: 01/01/2016 09:25 AM   Modules accepted: Level of Service

## 2016-02-15 ENCOUNTER — Other Ambulatory Visit: Payer: Self-pay

## 2016-02-15 DIAGNOSIS — J449 Chronic obstructive pulmonary disease, unspecified: Secondary | ICD-10-CM

## 2016-02-15 MED ORDER — ALBUTEROL SULFATE HFA 108 (90 BASE) MCG/ACT IN AERS: 2.0000 | INHALATION_SPRAY | Freq: Four times a day (QID) | RESPIRATORY_TRACT | Status: DC | PRN

## 2016-02-15 MED ORDER — FLUTICASONE-SALMETEROL 250-50 MCG/DOSE IN AEPB: 1.0000 | INHALATION_SPRAY | Freq: Two times a day (BID) | RESPIRATORY_TRACT | Status: DC

## 2016-02-15 NOTE — Telephone Encounter (Signed)
Pt requesting Advair [facility] @ [facility] on [address]

## 2016-02-16 NOTE — Telephone Encounter (Signed)
Dr Ladona Ridgeltaylor, could you please resend these and change to normal instead of print to [facility] [address], thanks

## 2016-02-19 ENCOUNTER — Other Ambulatory Visit: Payer: Self-pay | Admitting: Internal Medicine

## 2016-02-19 DIAGNOSIS — J449 Chronic obstructive pulmonary disease, unspecified: Secondary | ICD-10-CM

## 2016-02-19 MED ORDER — ALBUTEROL SULFATE HFA 108 (90 BASE) MCG/ACT IN AERS: 2.0000 | INHALATION_SPRAY | Freq: Four times a day (QID) | RESPIRATORY_TRACT | Status: DC | PRN

## 2016-02-19 MED ORDER — FLUTICASONE-SALMETEROL 250-50 MCG/DOSE IN AEPB: 1.0000 | INHALATION_SPRAY | Freq: Two times a day (BID) | RESPIRATORY_TRACT | Status: DC

## 2016-02-21 ENCOUNTER — Encounter: Payer: Self-pay | Admitting: *Deleted

## 2016-03-25 ENCOUNTER — Ambulatory Visit: Payer: BLUE CROSS/BLUE SHIELD | Admitting: Sports Medicine

## 2016-04-01 ENCOUNTER — Encounter: Payer: Self-pay | Admitting: Sports Medicine

## 2016-04-01 ENCOUNTER — Ambulatory Visit (INDEPENDENT_AMBULATORY_CARE_PROVIDER_SITE_OTHER): Payer: BLUE CROSS/BLUE SHIELD | Admitting: Sports Medicine

## 2016-04-01 VITALS — BP 100/66 | Ht 64.0 in | Wt 150.0 lb

## 2016-04-01 DIAGNOSIS — M25552 Pain in left hip: Secondary | ICD-10-CM | POA: Insufficient documentation

## 2016-04-01 DIAGNOSIS — M7062 Trochanteric bursitis, left hip: Secondary | ICD-10-CM | POA: Diagnosis not present

## 2016-04-01 MED ORDER — CYCLOBENZAPRINE HCL 10 MG PO TABS: ORAL_TABLET | ORAL | 1 refills | Status: DC

## 2016-04-01 MED ORDER — METHYLPREDNISOLONE ACETATE 40 MG/ML IJ SUSP
40.0000 mg | Freq: Once | INTRAMUSCULAR | Status: AC
  Administered 2016-04-01: 40 mg via INTRA_ARTICULAR

## 2016-04-01 NOTE — Assessment & Plan Note (Addendum)
Lateral hip pain likely due to trochanteric bursitis. 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 Current risk factors for AVN including alcohol abuse, however due to normal range of motion on exam and no groin pain will hold off on imaging per request of patient. Per patient will have insurance in September.  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 Cleansed with alcohol. Topical analgesic spray: Ethyl chloride. Joint: Greater trochanter  Approached in typical fashion Completed without difficulty Meds: Depo-Medrol (80 mg) and Xylocaine 1% 6 ml  Needle: 1.5 inch needle, 25 gauge.  Aftercare instructions and Red flags advised.

## 2016-04-01 NOTE — Progress Notes (Signed)
   [facility] Noralee Chars, MD Phone: [phone]  Reason For Visit: Left Lateral Hip Pain   # 3 months of left hip pain. Patient presents with worsening lateral hip pain. 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 States pain is localized to the lateral hip. Improves with rest. Does not radiate to the groin. No inciting event. No hx of trauma. No nighttime awakenings.  Patient is a known alcoholic.  Patient has taken Alieve to help with pain.   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  Medications- reviewed and updated No additions to family history Social history- patient is a previous smoker (40 years), vapor tobacco now   Objective: BP 100/66   Ht 5\' 4"  (1.626 m)   Wt 150 lb (68 kg)   BMI 25.75 kg/m  Gen: NAD, alert, cooperative with exam Left hip: No abnormalities noted on inspection of hip. Pain with palpation of greater trochanter on left side, no groin pain on palpation. Range of motion within normal limits for internal, external rotation, flexion, extension, abduction and adduction. 5 out of 5 strength. FABER and FADIR negative. Neurovascularly intact.  Right Hip: No abnormalities noted on inspection. No pain with palpation of greater trochanter or groin. Range of motion within normal limits. 5 out of 5 strength. FABER and FADIR negative.    Assessment/Plan: See problem based a/p  Left hip pain Lateral hip pain likely due to trochanteric bursitis. 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 Current risk factors for AVN including alcohol abuse, however due to normal range of motion on exam and no groin pain will hold off on imaging per request of patient. Per patient will have insurance in September.  - Injection for likely  trochanteric bursitis today  -Follow up in 6 weeks -Referral to orthopedics in September when patient has Environmental education officer  Consent obtained and verified. Cleansed with alcohol. Topical analgesic spray: Ethyl chloride. Joint: Greater trochanter  Approached in typical fashion Completed without difficulty Meds: Depo-Medrol (80 mg) and Xylocaine 1% 6 ml  Needle: 1.5 inch needle, 25 gauge.  Aftercare instructions and Red flags advised.     Patient seen and evaluated with the resident. I agree with the above plan of care. Greater trochanteric injection performed today without difficulty. If pain persists then I would consider referral to Dr. Magnus Ivan when the patient gets insurance (September). 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

## 2016-04-05 ENCOUNTER — Other Ambulatory Visit: Payer: Self-pay | Admitting: Internal Medicine

## 2016-04-05 NOTE — Telephone Encounter (Signed)
Last appt 12/29/15.  no f/u appt scheduled.

## 2016-04-15 ENCOUNTER — Ambulatory Visit (INDEPENDENT_AMBULATORY_CARE_PROVIDER_SITE_OTHER): Payer: BLUE CROSS/BLUE SHIELD | Admitting: Internal Medicine

## 2016-04-15 VITALS — BP 116/70 | HR 100 | Temp 98.2°F | Ht 64.0 in | Wt 160.3 lb

## 2016-04-15 DIAGNOSIS — I10 Essential (primary) hypertension: Secondary | ICD-10-CM | POA: Diagnosis not present

## 2016-04-15 DIAGNOSIS — J449 Chronic obstructive pulmonary disease, unspecified: Secondary | ICD-10-CM

## 2016-04-15 DIAGNOSIS — Z79899 Other long term (current) drug therapy: Secondary | ICD-10-CM

## 2016-04-15 DIAGNOSIS — Z87891 Personal history of nicotine dependence: Secondary | ICD-10-CM

## 2016-04-15 DIAGNOSIS — E785 Hyperlipidemia, unspecified: Secondary | ICD-10-CM | POA: Diagnosis not present

## 2016-04-15 MED ORDER — OLMESARTAN MEDOXOMIL 20 MG PO TABS: 20.0000 mg | ORAL_TABLET | Freq: Every day | ORAL | 3 refills | Status: DC

## 2016-04-15 MED ORDER — FLUTICASONE-SALMETEROL 250-50 MCG/DOSE IN AEPB: 1.0000 | INHALATION_SPRAY | Freq: Two times a day (BID) | RESPIRATORY_TRACT | 3 refills | Status: DC

## 2016-04-15 MED ORDER — MOMETASONE FUROATE 50 MCG/ACT NA SUSP: NASAL | 0 refills | Status: DC

## 2016-04-15 MED ORDER — EZETIMIBE 10 MG PO TABS: 10.0000 mg | ORAL_TABLET | Freq: Every day | ORAL | 2 refills | Status: DC

## 2016-04-15 NOTE — Patient Instructions (Signed)
Mr. Samuel Chen  Your medications have been refilled to your  Pharmacy Please call if you have any questions or concerns Please follow up in 1 month

## 2016-04-16 NOTE — Progress Notes (Signed)
   CC: follow up for Hyperlipidemia   HPI:  Mr.Nikai K Neitzke is a 60 y.o. male with PMHx of HTN, COPD and Hyperlipidemia [facility].  He has failed pravastatin and rosuvastatin due to muscle cramps.  Per EMR it appears he has been off cholesterol medication since April of 2016. He is here today requesting refills of his medications.  He notes that he was recently at a health screening at his workplace was was told his cholesterol was >200.  Past Medical History:  Diagnosis Date  . 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 Allergic rhinitis   . COPD (chronic obstructive pulmonary disease) (HCC)    Questionable diagnosis.  . Dyshidrotic eczema   . GERD (gastroesophageal reflux disease)   . Hyperlipidemia   . Hypertension   . Myalgia 11/2007   Likely 2/2 Pravachol  . Tobacco abuse     Review of Systems:  Review of Systems  Respiratory: Negative for shortness of breath.   Cardiovascular: Negative for chest pain.     Physical Exam:  Vitals:   04/15/16 1550  BP: 116/70  Pulse: 100  Temp: 98.2 F (36.8 C)  TempSrc: Oral  SpO2: 98%  Weight: 160 lb 4.8 oz (72.7 kg)  Height: 5\' 4"  (1.626 m)   Physical Exam  Constitutional: He is well-developed, well-nourished, and in no distress.  HENT:  Head: Normocephalic and atraumatic.  Cardiovascular: Normal rate, regular rhythm and normal heart sounds.   Pulmonary/Chest: Effort normal and breath sounds normal.  Abdominal: Soft. There is no tenderness.  Musculoskeletal: He exhibits no edema.  Skin: Skin is warm and dry.    Assessment & Plan:   See encounters tab for problem based medical decision making.   Patient seen with Dr. Criselda PeachesMullen

## 2016-04-16 NOTE — Assessment & Plan Note (Signed)
Assessment: COPD Gold stage B with asthmatic bronchitis, stable  Plan: Refilled Advair

## 2016-04-16 NOTE — Assessment & Plan Note (Signed)
Assessment: Essential Hypertension at goal   Plan: Refilled Omesartan 20mg  daily

## 2016-04-16 NOTE — Assessment & Plan Note (Signed)
Assessment: Hyperlipidemia with possible history of statin myalgia  Plan: Start zetia 10mg  daily In further chart review after patient it appears that he has previously complained on muscle aches after the statin medications were discontinued, at that time it was thought that the muscle aches may be due to hypokalemia/hypomagnesemia in the setting on chronic alcohol abuse.  In addition it does appear that he has had is thyroid checked and was normal however he has never had a vitamin D level checked.  I will have him return in 1 month, at that time we can check a CMP as well as a vitamin D level and discuss possibly a retrial of a lipophilic statin.

## 2016-04-25 NOTE — Progress Notes (Signed)
Internal Medicine Clinic Attending  I saw and evaluated the patient.  I personally confirmed the key portions of the history and exam documented by Dr. Hoffman and I reviewed pertinent patient test results.  The assessment, diagnosis, and plan were formulated together and I agree with the documentation in the resident's note.      

## 2016-05-07 ENCOUNTER — Telehealth: Payer: Self-pay | Admitting: *Deleted

## 2016-05-07 NOTE — Telephone Encounter (Signed)
Call from pt - states cholesterol medication, Zetia, is causing cramping of hands, feet, legs and muscles. Would like to change to different medication has insurance will cover. Use [facility]. Thanks

## 2016-05-10 ENCOUNTER — Other Ambulatory Visit: Payer: Self-pay | Admitting: Internal Medicine

## 2016-05-10 ENCOUNTER — Other Ambulatory Visit: Payer: Self-pay

## 2016-05-10 DIAGNOSIS — IMO0001 Reserved for inherently not codable concepts without codable children: Secondary | ICD-10-CM

## 2016-05-10 NOTE — Telephone Encounter (Signed)
Requesting nasonex to be filled, also please call pt back regarding Zetia.

## 2016-05-10 NOTE — Telephone Encounter (Signed)
Talked to Dr Mila HomerJ Hoffman - Stated pt can stop taking Zetia. Doesn't think med causing cramping, so she wants pt to have blood work done and to see her, not necessary on the same day - pt has transportation issues. Will put in future lab order. I called pt - told him he can stop Zetia; stated too expensive anyway. Stated on 6 month probation at his job; needs late appt; get off of work 3:30 PM. Dr Mikey BussingHoffman next available appt is in December. Pt will come in for labs on 9/13 @ 1600PM. Dr Mikey BussingHoffman - do yoy want to see him or schedule [facility] ?

## 2016-05-15 ENCOUNTER — Other Ambulatory Visit (INDEPENDENT_AMBULATORY_CARE_PROVIDER_SITE_OTHER): Payer: Commercial Managed Care - HMO

## 2016-05-15 DIAGNOSIS — I10 Essential (primary) hypertension: Secondary | ICD-10-CM | POA: Diagnosis not present

## 2016-05-15 DIAGNOSIS — IMO0001 Reserved for inherently not codable concepts without codable children: Secondary | ICD-10-CM

## 2016-05-16 ENCOUNTER — Other Ambulatory Visit: Payer: BLUE CROSS/BLUE SHIELD

## 2016-05-16 LAB — CK: CK TOTAL: 173 U/L (ref 24–204)

## 2016-05-16 LAB — BMP8+ANION GAP
ANION GAP: 20 mmol/L — AB (ref 10.0–18.0)
BUN/Creatinine Ratio: 15 (ref 9–20)
BUN: 18 mg/dL (ref 6–24)
CALCIUM: 9.3 mg/dL (ref 8.7–10.2)
CHLORIDE: 101 mmol/L (ref 96–106)
CO2: 21 mmol/L (ref 18–29)
Creatinine, Ser: 1.24 mg/dL (ref 0.76–1.27)
GFR, EST AFRICAN AMERICAN: 74 mL/min/{1.73_m2} (ref >59)
GFR, EST NON AFRICAN AMERICAN: 64 mL/min/{1.73_m2} (ref >59)
Glucose: 108 mg/dL — ABNORMAL HIGH (ref 65–99)
POTASSIUM: 4.3 mmol/L (ref 3.5–5.2)
Sodium: 142 mmol/L (ref 134–144)

## 2016-05-16 LAB — TSH: TSH: 1.81 u[IU]/mL (ref 0.450–4.500)

## 2016-05-16 LAB — VITAMIN D 25 HYDROXY (VIT D DEFICIENCY, FRACTURES): VIT D 25 HYDROXY: 14.2 ng/mL — AB (ref 30.0–100.0)

## 2016-05-16 NOTE — Telephone Encounter (Signed)
Labs done yesterday. Dr Mikey BussingHoffman has no available appt at this time.

## 2016-05-18 MED ORDER — CHOLECALCIFEROL 25 MCG (1000 UT) PO TABS: 1000.0000 [IU] | ORAL_TABLET | Freq: Every day | ORAL | 2 refills | Status: DC

## 2016-05-20 ENCOUNTER — Telehealth: Payer: Self-pay | Admitting: *Deleted

## 2016-05-20 NOTE — Telephone Encounter (Signed)
-----   Message from Camelia PhenesJessica Ratliff Hoffman, [place] sent at 05/18/2016  8:59 AM EDT ----- Could you please contact Mr. Spofford and let him know that his Vit D was low and that I prescribed Cholecalciferol 1000units daily and this was sent to his pharmacy.  This low Vitamin D can be causing his muscle aches.  He needs to take this daily and he can resume his Statin medication.   Thank you. Geralyn CorwinJessica Hoffman    ----- Message ----- From: Nell RangeInterface, [facility] Lab Results In Sent: 05/16/2016   5:41 AM To: Camelia PhenesJessica Ratliff Hoffman, DO

## 2016-05-20 NOTE — Telephone Encounter (Signed)
Pt called / informed "his Vit D was low and that I prescribed Cholecalciferol 1000units daily and this was sent to his pharmacy. This low Vitamin D can be causing his muscle aches. He needs to take this daily and he can resume his Statin medication." per Dr Mila HomerJ Hoffman. Voiced understanding.  Pt stated Zetia too expensive; cannot afford. He has changed insurance - cost $50.00 for 30 pills. Can u change to a different medication? Thanks

## 2016-05-22 MED ORDER — ATORVASTATIN CALCIUM 40 MG PO TABS: 40.0000 mg | ORAL_TABLET | Freq: Every day | ORAL | 1 refills | Status: DC

## 2016-05-23 NOTE — Telephone Encounter (Signed)
Pt called and informed Atorvastatin (Lipitor) has been ordered and sent to his pharmacy.

## 2016-05-23 NOTE — Telephone Encounter (Signed)
Pt aware of new rx for Lipitor.

## 2016-06-05 ENCOUNTER — Telehealth: Payer: Self-pay | Admitting: Internal Medicine

## 2016-06-05 NOTE — Telephone Encounter (Signed)
Request for change of medication sent to pt's doctor.

## 2016-06-05 NOTE — Telephone Encounter (Signed)
Pt insurance is too high for his Fluticasone-Salmeterol (ADVAIR) 250-50 MCG/DOSE AEPB medications.  He would like something else instead.

## 2016-06-14 ENCOUNTER — Other Ambulatory Visit: Payer: Self-pay | Admitting: Pharmacist

## 2016-06-14 DIAGNOSIS — J449 Chronic obstructive pulmonary disease, unspecified: Secondary | ICD-10-CM

## 2016-06-14 MED ORDER — MOMETASONE FURO-FORMOTEROL FUM 200-5 MCG/ACT IN AERO: 2.0000 | INHALATION_SPRAY | Freq: Two times a day (BID) | RESPIRATORY_TRACT | 0 refills | Status: DC

## 2016-06-14 MED ORDER — BUDESONIDE-FORMOTEROL FUMARATE 160-4.5 MCG/ACT IN AERO: 2.0000 | INHALATION_SPRAY | Freq: Two times a day (BID) | RESPIRATORY_TRACT | 12 refills | Status: DC

## 2016-06-14 NOTE — Telephone Encounter (Signed)
Patient stated he can pay for Symbicort ($30 compared to $50 Advair). I sent in a prescription for the Symbicort.  Thank you!

## 2016-06-14 NOTE — Telephone Encounter (Signed)
Dr Selena BattenKim - can you determine what is on his formulary? Thanks

## 2016-06-14 NOTE — Progress Notes (Signed)
Patient requesting cheaper option than Advair. Cheapest so far is Symbicort but can also consider nebulizer solutions in the future (patient does not have a nebulizer machine). Patient agreed he would be able to pay for the Symbicort.

## 2016-06-14 NOTE — Telephone Encounter (Signed)
Pt is calling again - states Advair too expensive; requesting change in medication. Thanks

## 2016-06-17 ENCOUNTER — Ambulatory Visit: Payer: BLUE CROSS/BLUE SHIELD | Admitting: Pharmacist

## 2016-06-18 ENCOUNTER — Telehealth: Payer: Self-pay

## 2016-06-18 NOTE — Telephone Encounter (Signed)
Pt calls and states his inhaler is still $50.00 and he cannot afford $50.00, he would like something cheap

## 2016-06-18 NOTE — Telephone Encounter (Signed)
Requesting the nurse to call back regarding inhaler.

## 2016-06-18 NOTE — Telephone Encounter (Signed)
On 10/13 he was prescribed Symbicort because it was supposed to be $30 compared to advair $50.  Is he saying that the Symbicort is also $50?

## 2016-06-19 NOTE — Telephone Encounter (Signed)
Yes, I will call the pharm and check myself

## 2016-06-19 NOTE — Telephone Encounter (Signed)
Albuterol is 35.00 symbicort is 30.00 dulera is 50.00 The pharmacy will get the symbicort ready and call pt

## 2016-07-22 ENCOUNTER — Other Ambulatory Visit: Payer: Self-pay | Admitting: Internal Medicine

## 2016-08-05 ENCOUNTER — Encounter (INDEPENDENT_AMBULATORY_CARE_PROVIDER_SITE_OTHER): Payer: Self-pay

## 2016-08-05 ENCOUNTER — Ambulatory Visit (INDEPENDENT_AMBULATORY_CARE_PROVIDER_SITE_OTHER): Payer: Commercial Managed Care - HMO | Admitting: Internal Medicine

## 2016-08-05 VITALS — BP 137/78 | HR 80 | Temp 98.0°F | Ht 63.0 in | Wt 160.9 lb

## 2016-08-05 DIAGNOSIS — Z87891 Personal history of nicotine dependence: Secondary | ICD-10-CM

## 2016-08-05 DIAGNOSIS — R21 Rash and other nonspecific skin eruption: Secondary | ICD-10-CM | POA: Diagnosis not present

## 2016-08-05 DIAGNOSIS — T7840XA Allergy, unspecified, initial encounter: Secondary | ICD-10-CM

## 2016-08-05 DIAGNOSIS — M25519 Pain in unspecified shoulder: Secondary | ICD-10-CM

## 2016-08-05 DIAGNOSIS — J309 Allergic rhinitis, unspecified: Secondary | ICD-10-CM

## 2016-08-05 DIAGNOSIS — M25511 Pain in right shoulder: Secondary | ICD-10-CM

## 2016-08-05 DIAGNOSIS — M25512 Pain in left shoulder: Secondary | ICD-10-CM

## 2016-08-05 MED ORDER — CLOBETASOL PROPIONATE 0.05 % EX CREA: 1.0000 "application " | TOPICAL_CREAM | Freq: Two times a day (BID) | CUTANEOUS | 0 refills | Status: DC

## 2016-08-05 MED ORDER — METHOCARBAMOL 500 MG PO TABS: 1000.0000 mg | ORAL_TABLET | Freq: Every evening | ORAL | 0 refills | Status: DC | PRN

## 2016-08-05 MED ORDER — HYDROXYZINE HCL 25 MG PO TABS: 25.0000 mg | ORAL_TABLET | Freq: Three times a day (TID) | ORAL | 0 refills | Status: DC | PRN

## 2016-08-05 NOTE — Patient Instructions (Addendum)
It was a pleasure meeting you today! I'm sorry you are itching!  1. Today we talked about your rash. This is likely an allergic reaction. For this I am starting you on a medication called Vistaril (Hydroxyzine) to take as needed for itching. This medication will likely make you tired so please use it with caution.  2. I am also prescribing you a topical steroid cream called Clobetasol. I believe this is the topical cream they prescribed you last time. Please apply it to the itching areas twice daily until clear.  3. Please pick up an over the counter medication called ZYRTEC (GENERIC IS CETIRIZINE) and take this for 2 weeks.  4. Please call the clinic and make an appointment if your symptoms do not improve by the end of the week, otherwise see your primary care physician as usually scheduled.   MEDICATION REVIEW: -Hydroxyzine as needed for itching -Topical Clobetasol as needed for itching -Cetirizine (Zyrtec) DAILY.

## 2016-08-05 NOTE — Progress Notes (Signed)
   CC: All over itching and rash  HPI:  [name] is a 60 y.o. male who presents to the internal medicine clinic with chief complaint of diffuse itchy body rash that began on Thursday. He denies any new foods, new medications, new over-the-counter medications, new laundry detergents or body products or new exposure to chemicals. Reports the rash is most prominent on his neck, chest, and groin and is exceptionally itchy. There is no pain. Has had one similar prior episode several years ago which was thought to be due to a new fabric softener sheet. This was treated with oral hydroxyzine and topical steroid cream with significant improvement of his symptoms. He reports he has been tested for allergies by his ENT and reports he is allergic to dust mites. Denies any symptoms of anaphylaxis including shortness of breath, dysphagia or any other symptoms.   Past Medical History:  Diagnosis Date  . 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 Allergic rhinitis   . COPD (chronic obstructive pulmonary disease) (HCC)    Questionable diagnosis.  . Dyshidrotic eczema   . GERD (gastroesophageal reflux disease)   . Hyperlipidemia   . Hypertension   . Myalgia 11/2007   Likely 2/2 Pravachol  . Tobacco abuse     Review of Systems:  Review of Systems  Constitutional: Negative for chills and fever.  Respiratory: Negative for shortness of breath, wheezing and stridor.   Cardiovascular: Negative for chest pain, palpitations and leg swelling.  Gastrointestinal: Negative for abdominal pain, constipation and diarrhea.  Musculoskeletal: Negative for joint pain and myalgias.  Skin: Positive for itching and rash.     Physical Exam: Physical Exam  Constitutional: He is well-developed, well-nourished, and in no distress.  Itching throughout examination.   HENT:  Head: Normocephalic and atraumatic.  Mouth/Throat: Oropharynx is clear and moist.  Cardiovascular: Normal rate, regular rhythm and  normal heart sounds.   Pulmonary/Chest: Effort normal and breath sounds normal. No stridor. No respiratory distress.  Abdominal: Bowel sounds are normal. He exhibits no distension.  Musculoskeletal: He exhibits no edema or tenderness.  Lymphadenopathy:    He has no cervical adenopathy.  Skin: Skin is warm and dry. Rash noted. He is not diaphoretic.  Diffuse body rash, best described as pruritic papules on a red base. Most prominent around neck, on chest and back, as well as antecubital fossas.     Vitals:   08/05/16 1417  BP: 137/78  Pulse: 80  Temp: 98 F (36.7 C)  TempSrc: Oral  SpO2: 100%  Weight: 160 lb 14.4 oz (73 kg)  Height: 5\' 3"  (1.6 m)    Assessment & Plan:   See Encounters Tab for problem based charting.  Patient seen with Dr. Criselda PeachesMullen

## 2016-08-05 NOTE — Assessment & Plan Note (Signed)
Patient here with a 5 day history of diffuse itchy body rash consistent with an allergic reaction. Reports he's had this once before several years ago, thought to be due to a change in dryer sheets, and was successfully treated with hydroxyzine and topical steroid cream. He reports he's been tested for allergies by his ENT is allergic only to dust mites. Does not take any antihistamines. Has tried oral Benadryl and topical over-the-counter hydrocortisone with mild symptom improvement. -Hydroxyzine 25 mg when necessary 3 times a day -Topical clobetasol cream twice a day when necessary -Zyrtec daily 2 weeks -Instructed to follow-up if symptoms do not resolve by the end of the week. -Recommended additional allergy testing in the future

## 2016-08-05 NOTE — Assessment & Plan Note (Signed)
Patient reports he has been using Flonase twice daily with improvement of his symptoms. Was encouraged to continue this therapy during this time of year. Denied any sinus pressure or pain, fevers, chills, cough. -Continue Flonase -Zyrtec

## 2016-08-05 NOTE — Assessment & Plan Note (Signed)
Patient has been seen by sports medicine for this complaint and was started on Flexeril for this. Reports he was told by both his primary care physician and to sports medicine physician that he should continue taking Robaxin once his course of Flexeril as completed. -Refill Robaxin given.

## 2016-08-13 NOTE — Progress Notes (Signed)
Internal Medicine Clinic Attending  I saw and evaluated the patient.  I personally confirmed the key portions of the history and exam documented by Dr. Molt and I reviewed pertinent patient test results.  The assessment, diagnosis, and plan were formulated together and I agree with the documentation in the resident's note. 

## 2016-09-12 ENCOUNTER — Encounter: Payer: Self-pay | Admitting: Internal Medicine

## 2016-09-12 ENCOUNTER — Ambulatory Visit (INDEPENDENT_AMBULATORY_CARE_PROVIDER_SITE_OTHER): Payer: Commercial Managed Care - HMO | Admitting: Internal Medicine

## 2016-09-12 VITALS — BP 131/81 | HR 89 | Temp 98.5°F | Ht 64.0 in | Wt 153.9 lb

## 2016-09-12 DIAGNOSIS — R197 Diarrhea, unspecified: Secondary | ICD-10-CM

## 2016-09-12 DIAGNOSIS — R1031 Right lower quadrant pain: Secondary | ICD-10-CM | POA: Diagnosis not present

## 2016-09-12 DIAGNOSIS — R1032 Left lower quadrant pain: Secondary | ICD-10-CM | POA: Diagnosis not present

## 2016-09-12 DIAGNOSIS — R112 Nausea with vomiting, unspecified: Secondary | ICD-10-CM

## 2016-09-12 MED ORDER — PROMETHAZINE HCL 25 MG PO TABS: 12.5000 mg | ORAL_TABLET | Freq: Four times a day (QID) | ORAL | 0 refills | Status: DC | PRN

## 2016-09-12 NOTE — Patient Instructions (Signed)
It was a pleasure to see you today Mr. Samuel Chen.  I am sorry to hear you are feeling so poorly today. For your nausea I have prescribed phernergan (promethazine) to take up to every 6 hours as needed. The most important thing is to stay hydrated.  For fever or body aches I recommend tylenol 650mg  (2 regular strength) up to every 6 hours as needed. I would not recommend continuing this medicine long term but I expect your symptoms to improve within about 2 or 3 more days.

## 2016-09-12 NOTE — Progress Notes (Signed)
   CC: Nausea, diarrhea  HPI:  Samuel Chen is a 61 y.o. man with a history of hypertension, COPD, GERD who is here today after 2 days of diarrhea, abdominal pain, nausea, and vomiting. These symptoms started 2 days ago and worsened over the course of one day. His diarrhea is loose with normal color and no blood. He has vomited twice from nausea but is able to drink water and ginger ale. He has not travelled anywhere out of the city within weeks. He also denies any recent change or unusual diet. He has associated fevers and chills. He feels that he has body aches all over. He does not have much complaint of sinus congestion or cough. He denies any rash.  See problem based assessment and plan below for additional details  Past Medical History:  Diagnosis Date  . 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 Allergic rhinitis   . COPD (chronic obstructive pulmonary disease) (HCC)    Questionable diagnosis.  . Dyshidrotic eczema   . GERD (gastroesophageal reflux disease)   . Hyperlipidemia   . Hypertension   . Myalgia 11/2007   Likely 2/2 Pravachol  . Tobacco abuse     Review of Systems:  Review of Systems  Constitutional: Positive for chills and fever.  HENT: Negative for congestion.   Eyes: Negative for blurred vision.  Respiratory: Negative for shortness of breath.   Cardiovascular: Negative for chest pain.  Gastrointestinal: Positive for abdominal pain, diarrhea, nausea and vomiting. Negative for blood in stool.  Genitourinary: Negative for dysuria.  Musculoskeletal: Positive for myalgias.  Skin: Negative for rash.  Neurological: Negative for dizziness and focal weakness.    Physical Exam: Physical Exam  Constitutional: He is oriented to person, place, and time and well-developed, well-nourished, and in no distress. No distress.  HENT:  Mouth/Throat: Oropharynx is clear and moist. No oropharyngeal exudate.  Eyes: Conjunctivae are normal.  Cardiovascular: Normal  rate and regular rhythm.   Pulmonary/Chest: Effort normal and breath sounds normal.  Abdominal:  Soft, tender to palpation over lower left and right quadrants  Musculoskeletal: He exhibits no edema.  Lymphadenopathy:    He has no cervical adenopathy.  Neurological: He is alert and oriented to person, place, and time.  Skin: Skin is warm and dry. No rash noted.    Vitals:   09/12/16 1519  BP: 131/81  Pulse: 89  Temp: 98.5 F (36.9 C)  TempSrc: Oral  SpO2: 100%  Weight: 153 lb 14.4 oz (69.8 kg)  Height: 5\' 4"  (1.626 m)    Assessment & Plan:   See Encounters Tab for problem based charting.  Patient discussed with Dr. Oswaldo DoneVincent

## 2016-09-15 NOTE — Assessment & Plan Note (Signed)
A: This illness appears to be a viral infection either a gastroenteritis or possibly influenza. He does not have bloody diarrhea or very severe abdominal pain nor any exposure history to suggest a more serious GI infection. He does not have any respiratory complaints, he has only mild fatigue, and onset of symptoms was more progressive so it would not be entirely classic for a flu presentation either. I will recommend symptomatic management of his symptoms today with the expectation of improvement in next week.  P: - Phenergan TID PRN for nausea - Tylenol 650mg  QID PRN for fever or aches - Emphasized importance of staying hydrated - Instructed to call back to clinic if not improving within next approximately 5 days

## 2016-09-17 NOTE — Progress Notes (Signed)
Internal Medicine Clinic Attending  Case discussed with Dr. Rice at the time of the visit.  We reviewed the resident's history and exam and pertinent patient test results.  I agree with the assessment, diagnosis, and plan of care documented in the resident's note.  

## 2016-09-23 ENCOUNTER — Other Ambulatory Visit: Payer: Self-pay | Admitting: Internal Medicine

## 2016-10-31 ENCOUNTER — Encounter: Payer: Commercial Managed Care - HMO | Admitting: Internal Medicine

## 2016-11-05 ENCOUNTER — Encounter: Payer: Self-pay | Admitting: Sports Medicine

## 2016-11-05 ENCOUNTER — Ambulatory Visit
Admission: RE | Admit: 2016-11-05 | Discharge: 2016-11-05 | Disposition: A | Discharge status: Home / Self Care | Payer: Commercial Managed Care - HMO | Source: Ambulatory Visit | Attending: Sports Medicine | Admitting: Sports Medicine

## 2016-11-05 ENCOUNTER — Ambulatory Visit (INDEPENDENT_AMBULATORY_CARE_PROVIDER_SITE_OTHER): Payer: Commercial Managed Care - HMO | Admitting: Sports Medicine

## 2016-11-05 VITALS — BP 162/97 | HR 87 | Ht 64.0 in | Wt 140.0 lb

## 2016-11-05 DIAGNOSIS — M87051 Idiopathic aseptic necrosis of right femur: Secondary | ICD-10-CM | POA: Diagnosis not present

## 2016-11-05 DIAGNOSIS — M7552 Bursitis of left shoulder: Secondary | ICD-10-CM | POA: Diagnosis not present

## 2016-11-05 DIAGNOSIS — M25512 Pain in left shoulder: Secondary | ICD-10-CM | POA: Diagnosis not present

## 2016-11-05 DIAGNOSIS — M25551 Pain in right hip: Secondary | ICD-10-CM | POA: Diagnosis not present

## 2016-11-05 MED ORDER — MELOXICAM 15 MG PO TABS: ORAL_TABLET | ORAL | 0 refills | Status: DC

## 2016-11-05 MED ORDER — CYCLOBENZAPRINE HCL 10 MG PO TABS: 10.0000 mg | ORAL_TABLET | Freq: Every day | ORAL | 0 refills | Status: DC | PRN

## 2016-11-06 NOTE — Progress Notes (Signed)
   Subjective:    Patient ID: Samuel Chen, male    DOB: [birth year], 61 y.o.   MRN: [id number]  HPI chief complaint: Left shoulder and right hip pain  Samuel Chen comes in today with a couple of different complaints. He is complaining of returning left shoulder pain. He has had similar pain in the past. He's had several subacromial cortisone injections in the shoulder. Last injection was one year ago. It provided him with good symptom relief up until recently. He denies any recent trauma. He would be interested in a repeat injection. He would also like a refill on his meloxicam and Flexeril. He takes these as needed and notices good pain relief with them. He is also complaining of pain in the right groin. He is status post right total hip [facility]. He did well immediately postoperatively but over the past several months has begun to experience a deep-seated discomfort in the right groin with activity. His pain is worse with weightbearing but he will get symptoms at times when simply rolling over in bed. He denies any injury. No numbness or leg. No low back pain.  Interim medical history reviewed Medications reviewed Allergies reviewed     Review of Systems As above     Objective:   Physical Exam  Well-developed, well-nourished. No acute distress  Left shoulder: Patient has limited range of motion in all planes due to pain. Positive empty can, positive Hawkins. Rotator cuff strength is 5/5 bilaterally. No tenderness over the acromioclavicular joint nor over the bicipital groove. Neurovascularly intact distally.  Right hip: Smooth painless hip range of motion. Negative logroll. No tenderness to palpation. Good strength. Neurovascularly intact distally.      Assessment & Plan:  Right hip pain status post right total hip arthroplasty 2014 Left shoulder pain likely secondary to returning subacromial bursitis/rotator cuff tendinitis  Before proceeding  with treatment I sent the patient for x-rays of the right hip and left shoulder. Right hip x-ray is unremarkable. There is no obvious loosening of his prosthesis. X-rays of the left shoulder are unremarkable. No significant glenohumeral degenerative changes seen. I discussed these findings with the patient over the telephone. Since he has had good relief with previous subacromial cortisone injections he may feel free to schedule a follow-up procedure visit for a repeat injection. If he continues to have right hip pain then I would recommend a referral to Dr. Magnus [facility]Blackman at [facility] for further evaluation.

## 2016-11-24 ENCOUNTER — Other Ambulatory Visit: Payer: Self-pay | Admitting: Internal Medicine

## 2016-12-10 ENCOUNTER — Other Ambulatory Visit: Payer: Self-pay | Admitting: Internal Medicine

## 2016-12-10 DIAGNOSIS — I10 Essential (primary) hypertension: Secondary | ICD-10-CM

## 2016-12-11 NOTE — Telephone Encounter (Signed)
Could you please asked patient if he is taking lisinopril If not then I can go ahead and refill his olmesartan

## 2016-12-12 NOTE — Telephone Encounter (Signed)
Patient no longer takes lisinopril.

## 2017-03-06 ENCOUNTER — Other Ambulatory Visit: Payer: Self-pay

## 2017-03-06 NOTE — Telephone Encounter (Signed)
Requesting cholesterol med to be filled.  

## 2017-03-07 MED ORDER — ATORVASTATIN CALCIUM 40 MG PO TABS: 40.0000 mg | ORAL_TABLET | Freq: Every day | ORAL | 5 refills | Status: DC

## 2017-03-07 NOTE — Telephone Encounter (Signed)
Received fax from pt's pharmacy-wants to know the update of refill request.  Request still pending review by pcp.Samuel Chen, Samuel Sterbenz Cassady7/6/201811:21 AM

## 2017-04-03 ENCOUNTER — Ambulatory Visit (INDEPENDENT_AMBULATORY_CARE_PROVIDER_SITE_OTHER): Payer: 59 | Admitting: Internal Medicine

## 2017-04-03 ENCOUNTER — Encounter: Payer: Commercial Managed Care - HMO | Admitting: Internal Medicine

## 2017-04-03 ENCOUNTER — Encounter: Payer: Self-pay | Admitting: Internal Medicine

## 2017-04-03 VITALS — BP 167/93 | HR 93 | Temp 98.0°F | Wt 163.7 lb

## 2017-04-03 DIAGNOSIS — R7303 Prediabetes: Secondary | ICD-10-CM

## 2017-04-03 DIAGNOSIS — Z79899 Other long term (current) drug therapy: Secondary | ICD-10-CM

## 2017-04-03 DIAGNOSIS — Z87891 Personal history of nicotine dependence: Secondary | ICD-10-CM | POA: Diagnosis not present

## 2017-04-03 DIAGNOSIS — Z0000 Encounter for general adult medical examination without abnormal findings: Secondary | ICD-10-CM

## 2017-04-03 DIAGNOSIS — I10 Essential (primary) hypertension: Secondary | ICD-10-CM | POA: Diagnosis not present

## 2017-04-03 DIAGNOSIS — Z8249 Family history of ischemic heart disease and other diseases of the circulatory system: Secondary | ICD-10-CM

## 2017-04-03 DIAGNOSIS — Z7951 Long term (current) use of inhaled steroids: Secondary | ICD-10-CM

## 2017-04-03 DIAGNOSIS — J449 Chronic obstructive pulmonary disease, unspecified: Secondary | ICD-10-CM | POA: Diagnosis not present

## 2017-04-03 LAB — GLUCOSE, CAPILLARY: GLUCOSE-CAPILLARY: 103 mg/dL — AB (ref 65–99)

## 2017-04-03 MED ORDER — CYCLOBENZAPRINE HCL 10 MG PO TABS: 10.0000 mg | ORAL_TABLET | Freq: Every day | ORAL | 2 refills | Status: DC | PRN

## 2017-04-03 MED ORDER — ATENOLOL 50 MG PO TABS: 50.0000 mg | ORAL_TABLET | Freq: Every day | ORAL | 2 refills | Status: DC

## 2017-04-03 MED ORDER — BUDESONIDE-FORMOTEROL FUMARATE 160-4.5 MCG/ACT IN AERO: 2.0000 | INHALATION_SPRAY | Freq: Two times a day (BID) | RESPIRATORY_TRACT | 12 refills | Status: DC

## 2017-04-03 MED ORDER — ALBUTEROL SULFATE HFA 108 (90 BASE) MCG/ACT IN AERS: 2.0000 | INHALATION_SPRAY | Freq: Four times a day (QID) | RESPIRATORY_TRACT | 3 refills | Status: DC | PRN

## 2017-04-03 MED ORDER — OLMESARTAN MEDOXOMIL 20 MG PO TABS: 20.0000 mg | ORAL_TABLET | Freq: Every day | ORAL | 1 refills | Status: DC

## 2017-04-03 MED ORDER — ATORVASTATIN CALCIUM 40 MG PO TABS: 40.0000 mg | ORAL_TABLET | Freq: Every day | ORAL | 3 refills | Status: DC

## 2017-04-03 NOTE — Patient Instructions (Signed)
Mr. Samuel Chen,  It was a pleasure seeing you today. Your refills have been sent to the pharmacy. Please start taking atenolol 50 mg once daily. Please follow up in 3 months

## 2017-04-03 NOTE — Progress Notes (Signed)
   CC: Follow up on essential hypertension and COPD  HPI:  [name] is a 61 y.o. male with history noted below that presents to the [facility] Medicine Clinic for follow up on essential hypertension and COPD.  Please see problem based charting for the status of patient's chronic medical conditions.  Past Medical History:  Diagnosis Date  . 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 Allergic rhinitis   . COPD (chronic obstructive pulmonary disease) (HCC)    Questionable diagnosis.  . Dyshidrotic eczema   . GERD (gastroesophageal reflux disease)   . Hyperlipidemia   . Hypertension   . Myalgia 11/2007   Likely 2/2 Pravachol  . Tobacco abuse     Review of Systems:  Review of Systems  Respiratory: Negative for cough and shortness of breath.   Cardiovascular: Negative for chest pain, palpitations and leg swelling.  Gastrointestinal: Negative for nausea and vomiting.  Neurological: Negative for dizziness.     Physical Exam:  Vitals:   04/03/17 1627 04/03/17 1628  BP: (!) 161/74 (!) 167/93  Pulse: 80 93  Temp: 98 F (36.7 C)   TempSrc: Oral   SpO2: 100%   Weight: 163 lb 11.2 oz (74.3 kg)    Physical Exam  Constitutional: He is well-developed, well-nourished, and in no distress.  Cardiovascular: Normal rate, regular rhythm and normal heart sounds.  Exam reveals no gallop and no friction rub.   No murmur heard. Pulmonary/Chest: Effort normal and breath sounds normal. No respiratory distress. He has no wheezes. He has no rales.  Musculoskeletal: He exhibits no edema.  Skin: Skin is warm and dry.    Assessment & Plan:   See encounters tab for problem based medical decision making.   Patient discussed with Dr. Oswaldo DoneVincent

## 2017-04-04 LAB — BMP8+ANION GAP
Anion Gap: 15 mmol/L (ref 10.0–18.0)
BUN / CREAT RATIO: 13 (ref 9–20)
BUN: 14 mg/dL (ref 6–24)
CHLORIDE: 103 mmol/L (ref 96–106)
CO2: 22 mmol/L (ref 20–29)
Calcium: 9.3 mg/dL (ref 8.7–10.2)
Creatinine, Ser: 1.08 mg/dL (ref 0.76–1.27)
GFR calc Af Amer: 87 mL/min/{1.73_m2} (ref >59)
GFR calc non Af Amer: 75 mL/min/{1.73_m2} (ref >59)
Glucose: 105 mg/dL — ABNORMAL HIGH (ref 65–99)
Potassium: 4.7 mmol/L (ref 3.5–5.2)
SODIUM: 140 mmol/L (ref 134–144)

## 2017-04-04 LAB — HEPATITIS C ANTIBODY: HEP C VIRUS AB: 0.1 {s_co_ratio} (ref 0.0–0.9)

## 2017-04-06 DIAGNOSIS — R7303 Prediabetes: Secondary | ICD-10-CM | POA: Insufficient documentation

## 2017-04-06 DIAGNOSIS — E1169 Type 2 diabetes mellitus with other specified complication: Secondary | ICD-10-CM | POA: Insufficient documentation

## 2017-04-06 DIAGNOSIS — E119 Type 2 diabetes mellitus without complications: Secondary | ICD-10-CM | POA: Insufficient documentation

## 2017-04-06 NOTE — Assessment & Plan Note (Signed)
Assessment:  Pre-diabtes In 2014 patient had a hemoglobin A1C of 6.0.  Will recheck hemoglobin A1C today.  Plan -Hgb A1C in office:  Was 6.0

## 2017-04-06 NOTE — Assessment & Plan Note (Signed)
Assessment: health care maintenance Due for Hep C screening  Plan -Hep C antibody

## 2017-04-06 NOTE — Assessment & Plan Note (Signed)
Assessment:  COPD Patient reports compliance with Symbicort BID.  He denies any recent COPD exacerbations.  He denies wheezing or shortness of breath recently.  He states he uses his albuterol inhaler as needed, sparingly during the month.  Plan -refill Symbicort and albuterol inhaler

## 2017-04-06 NOTE — Assessment & Plan Note (Signed)
Assessment:  Essential hypertension Patient reports compliance with olmesartan 20mg .  His blood pressure was 161/74 and on repeat was 167/93.  This is elevated and not at goal of 140/80.  Patient states he has an allergy to lisinopril, HCTZ and amlodipine.  Will add atenolol 50mg .  Plan -atenolol 50mg  - follow up in 3 months

## 2017-04-07 ENCOUNTER — Telehealth: Payer: Self-pay | Admitting: *Deleted

## 2017-04-07 DIAGNOSIS — M25552 Pain in left hip: Secondary | ICD-10-CM

## 2017-04-07 NOTE — Telephone Encounter (Signed)
Referral sent to [facility].  °

## 2017-04-07 NOTE — Progress Notes (Signed)
Internal Medicine Clinic Attending  Case discussed with Dr. Hoffman at the time of the visit.  We reviewed the resident's history and exam and pertinent patient test results.  I agree with the assessment, diagnosis, and plan of care documented in the resident's note.  

## 2017-04-14 ENCOUNTER — Other Ambulatory Visit: Payer: Self-pay | Admitting: Internal Medicine

## 2017-04-14 DIAGNOSIS — T7840XA Allergy, unspecified, initial encounter: Secondary | ICD-10-CM

## 2017-04-14 LAB — POCT GLYCOSYLATED HEMOGLOBIN (HGB A1C): HEMOGLOBIN A1C: 6.1

## 2017-04-28 ENCOUNTER — Ambulatory Visit (INDEPENDENT_AMBULATORY_CARE_PROVIDER_SITE_OTHER): Payer: 59 | Admitting: Orthopaedic Surgery

## 2017-04-28 ENCOUNTER — Ambulatory Visit (INDEPENDENT_AMBULATORY_CARE_PROVIDER_SITE_OTHER): Payer: 59

## 2017-04-28 ENCOUNTER — Encounter (INDEPENDENT_AMBULATORY_CARE_PROVIDER_SITE_OTHER): Payer: Self-pay | Admitting: Orthopaedic Surgery

## 2017-04-28 DIAGNOSIS — M7062 Trochanteric bursitis, left hip: Secondary | ICD-10-CM | POA: Diagnosis not present

## 2017-04-28 DIAGNOSIS — M25552 Pain in left hip: Secondary | ICD-10-CM

## 2017-04-28 MED ORDER — LIDOCAINE HCL 1 % IJ SOLN
3.0000 mL | INTRAMUSCULAR | Status: AC | PRN
  Administered 2017-04-28: 3 mL

## 2017-04-28 MED ORDER — METHYLPREDNISOLONE ACETATE 40 MG/ML IJ SUSP
40.0000 mg | INTRAMUSCULAR | Status: AC | PRN
  Administered 2017-04-28: 40 mg via INTRA_ARTICULAR

## 2017-04-28 NOTE — Progress Notes (Signed)
Office Visit Note   Patient: Samuel Chen           Date of Birth: [birth year]           MRN: [id number] Visit Date: 04/28/2017              Requested by: Camelia Phenes, DO [address] [place], [place] [address] PCP: Camelia Phenes, DO   Assessment & Plan: Visit Diagnoses:  1. Pain in left hip   2. Trochanteric bursitis, left hip     Plan: I showed him his hip x-rays and explained this more of an issue of bursitis of the hip. He was glad to hear this. He did wish for steroid injection. Also showed him stretching exercises to try. Steroid injection tolerated well after discussion of the risk moves injection and he demonstrated the stretching exercises back to me. Of note I did notice that there is a leg length discrepancy with his right hip slightly longer than his left leg. He would benefit from an insert in his left shoe This to him as well. All questions were encouraged and answered. He'll follow up as needed.  Follow-Up Instructions: Return if symptoms worsen or fail to improve.   Orders:  Orders Placed This Encounter  Procedures  . Large Joint Injection/Arthrocentesis  . XR HIP UNILAT W OR W/O PELVIS 2-3 VIEWS LEFT   No orders of the defined types were placed in this encounter.     Procedures: Large Joint Inj Date/Time: 04/28/2017 10:33 AM Performed by: Kathryne Hitch Authorized by: Kathryne Hitch   Location:  Hip Site:  L greater trochanter Ultrasound Guidance: No   Fluoroscopic Guidance: No   Arthrogram: No   Medications:  3 mL lidocaine 1 %; 40 mg methylPREDNISolone acetate 40 MG/ML     Clinical Data: No additional findings.   Subjective: Chief Complaint  Patient presents with  . Left Hip - Pain  The patient comes in for evaluation treatment of left hip pain. He's had some groin pain but mainly on the lateral aspect of his left hip for about a year now. He has a history of a right total hip arthroplasty done over  [facility] several years ago. He says that does not bother him. I asked him about a leg length discrepancy and he says is not aware of anything. He denies any back pain. He denies any knee pain. Denies a nubs and tingling down his left leg. Is mainly just left hip pain. He denies any injury. He has had a trochanteric injection about a year ago and he said that helped quite significantly.  HPI  Review of Systems He currently denies any headache, chest pain, shortness of breath, fever, chills, nausea, vomiting.  Objective: Vital Signs: There were no vitals taken for this visit.  Physical Exam He is alert and oriented 3 and in no acute distress he walks with a slight limp but no assistive device is used. Ortho Exam Examination of his left hip shows fluid active and passive range of motion with no pain in the groin at all and no blocks to motion. He has significant pain of the trochanteric area and some over the proximal iliotibial band but his knee exam back exam and foot and ankle exam are left side are all normal. Specialty Comments:  No specialty comments available.  Imaging: Xr Hip Unilat W Or W/o Pelvis 2-3 Views Left  Result Date: 04/28/2017 An AP pelvis and a lateral of  his left hip shows a well-seated total hip arthroplasty on the right side. His left hip joint is normal. There is no acute findings.    PMFS History: Patient Active Problem List   Diagnosis Date Noted  . Trochanteric bursitis, left hip 04/28/2017  . Pain in left hip 04/28/2017  . Prediabetes 04/06/2017  . Left hip pain 04/01/2016  . Normocytic anemia 08/16/2015  . Family history of MI (myocardial infarction) 08/10/2015  . Health care maintenance 05/18/2014  . Muscle cramps 08/13/2013  . Reactive cervical lymphadenopathy 04/13/2013  . Avascular necrosis of bone of right hip (HCC) 01/21/2013  . COPD (chronic obstructive pulmonary disease)Gold B   . Pain in joint, shoulder region 10/16/2009  . Allergic  rhinitis 04/01/2007  . Hyperlipidemia 10/23/2006  . ERECTILE DYSFUNCTION 10/23/2006  . Alcohol abuse 10/23/2006  . History of tobacco abuse 10/23/2006  . Essential hypertension 10/23/2006  . Gastroesophageal reflux disease 10/23/2006  . 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 ALCOHOLIC HEPATITIS, HX OF 10/23/2006   Past Medical History:  Diagnosis Date  . 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 Allergic rhinitis   . COPD (chronic obstructive pulmonary disease) (HCC)    Questionable diagnosis.  . Dyshidrotic eczema   . GERD (gastroesophageal reflux disease)   . Hyperlipidemia   . Hypertension   . Myalgia 11/2007   Likely 2/2 Pravachol  . Tobacco abuse     Family History  Problem Relation Age of Onset  . Heart disease Sister   . Heart attack Sister        34-35yo  . Heart disease Father     Past Surgical History:  Procedure Laterality Date  . HAND SURGERY  2002   right hand, tendon repair - tendons damanged at work   Social History   Occupational History  . Not on file.   Social History Main Topics  . Smoking status: Former Smoker    Packs/day: 1.50    Years: 40.00    Types: E-cigarettes    Start date: 06/03/1983    Quit date: 03/04/2013  . Smokeless tobacco: Never Used     Comment: Uses a Vapor.  . Alcohol use 12.0 oz/week    20 Cans of beer per week     Comment: PER WEEK  . Drug use: No  . Sexual activity: Not on file

## 2017-05-24 DIAGNOSIS — Z23 Encounter for immunization: Secondary | ICD-10-CM | POA: Diagnosis not present

## 2017-06-26 ENCOUNTER — Other Ambulatory Visit: Payer: Self-pay | Admitting: Internal Medicine

## 2017-07-01 ENCOUNTER — Ambulatory Visit (INDEPENDENT_AMBULATORY_CARE_PROVIDER_SITE_OTHER): Payer: 59 | Admitting: Allergy and Immunology

## 2017-07-01 ENCOUNTER — Encounter: Payer: Self-pay | Admitting: Allergy and Immunology

## 2017-07-01 VITALS — BP 136/80 | HR 84 | Resp 17 | Ht 63.5 in | Wt 164.2 lb

## 2017-07-01 DIAGNOSIS — J3089 Other allergic rhinitis: Secondary | ICD-10-CM

## 2017-07-01 DIAGNOSIS — J329 Chronic sinusitis, unspecified: Secondary | ICD-10-CM

## 2017-07-01 DIAGNOSIS — J4541 Moderate persistent asthma with (acute) exacerbation: Secondary | ICD-10-CM | POA: Diagnosis not present

## 2017-07-01 DIAGNOSIS — K219 Gastro-esophageal reflux disease without esophagitis: Secondary | ICD-10-CM | POA: Diagnosis not present

## 2017-07-01 MED ORDER — AMOXICILLIN-POT CLAVULANATE 875-125 MG PO TABS: 1.0000 | ORAL_TABLET | Freq: Two times a day (BID) | ORAL | 0 refills | Status: DC

## 2017-07-01 MED ORDER — METHYLPREDNISOLONE ACETATE 80 MG/ML IJ SUSP
80.0000 mg | Freq: Once | INTRAMUSCULAR | Status: AC
  Administered 2017-07-01: 80 mg via INTRAMUSCULAR

## 2017-07-01 NOTE — Patient Instructions (Addendum)
  1. Continue to perform Allergen avoidance measures  2. Continue to Treat and prevent inflammation:   A. continue Symbicort 160 2 inhalation twice a day  B. continue OTC Nasacort or fluticasone one spray each nostril twice a day  3. Treat infection:   A. Depo-Medrol 80 IM delivered in clinic today  B. Augmentin 875 one tablet twice a day for the next 14 days  4. If needed:   A. Proventil HFA 2 puffs every 4-6 hours  B. nasal saline wash  C. over-the-counter antihistamine - Zyrtec/Allegra/Claritin  5. Continue to Treat reflux with OTC Nexium and tapering off caffeine and chocolate  6. Return to clinic in 6 months or earlier if problem  7. Annual fall flu vaccine every year

## 2017-07-01 NOTE — Progress Notes (Signed)
Follow-up Note  Referring Provider: Geralyn Corwin Ratlif* Primary Provider: Camelia Phenes, DO Date of Office Visit: 07/01/2017  Subjective:   Samuel Chen (DOB: 09-20-1955) is a 61 y.o. male who [facility] 07[facility]/2017 in re-evaluation of the following:  HPI: Samuel Chen returns to this clinic in reevaluation of asthma, allergic rhinitis, and LPR. I have not seen him in this clinic since February 2017.  He has done very well while consistently using anti-inflammatory medications for both his upper and lower airway and treating reflux and has not required a systemic steroid or an antibiotic to treat any type of respiratory tract issue. He still uses a short acting bronchodilator just about every morning even though he continues to use Symbicort on a regular basis. He works hard at the [employer] and has no limitation in his ability to perform this work as a result of his asthma.  His reflux has been under excellent control and he is presently using over-the-counter Nexium and he does consume caffeine and chocolate on occasion.  Unfortunately, about 3 weeks ago he started to develop problems with progressive congestion of his nose and some occasional ugly nasal discharge and intermittent headaches along with some sneezing. He still has a sense of smell intact. He has not had any high fever.  Allergies as of 07/01/2017      Reactions   Acetaminophen    REACTION: (causes liver problems)   Amlodipine Other (See Comments)   Peripheral edema in BLE   Aspirin    REACTION: bleeding/nose bleeds      Medication List      acetaminophen-codeine 300-30 MG tablet Commonly known as:  TYLENOL #3   albuterol 108 (90 Base) MCG/ACT inhaler Commonly known as:  PROVENTIL HFA;VENTOLIN HFA Inhale 2 puffs into the lungs every 6 (six) hours as needed for wheezing.   atenolol 50 MG tablet Commonly known as:  TENORMIN TAKE 1 TABLET  BY MOUTH ONCE DAILY   atorvastatin 40 MG tablet Commonly known as:  LIPITOR Take 1 tablet (40 mg total) by mouth daily at 6 PM.   budesonide-formoterol 160-4.5 MCG/ACT inhaler Commonly known as:  SYMBICORT Inhale 2 puffs into the lungs 2 (two) times daily.   Cholecalciferol 1000 units tablet Take 1 tablet (1,000 Units total) by mouth daily.   clobetasol cream 0.05 % Commonly known as:  TEMOVATE APPLY 1 APPLICATION TOPICALLY 2 TIMES DAILY. APPLY TO ITCHY AREAS.   cyclobenzaprine 10 MG tablet Commonly known as:  FLEXERIL Take 1 tablet (10 mg total) by mouth daily as needed for muscle spasms.   Dexlansoprazole 30 MG capsule Commonly known as:  DEXILANT Take 1 capsule (30 mg total) by mouth daily.   hydrOXYzine 25 MG tablet Commonly known as:  ATARAX/VISTARIL Take 1 tablet (25 mg total) by mouth 3 (three) times daily as needed for itching.   ibuprofen 800 MG tablet Commonly known as:  ADVIL,MOTRIN   meloxicam 15 MG tablet Commonly known as:  MOBIC Take one tablet daily for 7 days, then take as needed for pain   methocarbamol 500 MG tablet Commonly known as:  ROBAXIN Take 2 tablets (1,000 mg total) by mouth at bedtime as needed for muscle spasms.   mometasone 50 MCG/ACT nasal spray Commonly known as:  NASONEX USE TWO SPRAY(S) IN EACH NOSTRIL ONCE DAILY ONCE DAILY   montelukast 10 MG tablet Commonly known as:  SINGULAIR Take one tablet once a day.  olmesartan 20 MG tablet Commonly known as:  BENICAR Take 1 tablet (20 mg total) by mouth daily.   sildenafil 100 MG tablet Commonly known as:  VIAGRA Take 1 tablet (100 mg total) by mouth as needed for erectile dysfunction.       Past Medical History:  Diagnosis Date  . 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 Allergic rhinitis   . COPD (chronic obstructive pulmonary disease) (HCC)    Questionable diagnosis.  . Dyshidrotic eczema   . GERD (gastroesophageal reflux disease)   . Hyperlipidemia   .  Hypertension   . Myalgia 11/2007   Likely 2/2 Pravachol  . Tobacco abuse     Past Surgical History:  Procedure Laterality Date  . HAND SURGERY  2002   right hand, tendon repair - tendons damanged at work    Review of systems negative except as noted in HPI / PMHx or noted below:  Review of Systems  Constitutional: Negative.   HENT: Negative.   Eyes: Negative.   Respiratory: Negative.   Cardiovascular: Negative.   Gastrointestinal: Negative.   Genitourinary: Negative.   Musculoskeletal: Negative.   Skin: Negative.   Neurological: Negative.   Endo/Heme/Allergies: Negative.   Psychiatric/Behavioral: Negative.      Objective:   Vitals:   07/01/17 1522  BP: 136/80  Pulse: 84  Resp: 17  SpO2: 96%   Height: 5' 3.5" (161.3 cm)  Weight: 164 lb 3.2 oz (74.5 kg)   Physical Exam  Constitutional: He is well-developed, well-nourished, and in no distress.  HENT:  Head: Normocephalic.  Right Ear: Tympanic membrane, external ear and ear canal normal.  Left Ear: Tympanic membrane, external ear and ear canal normal.  Nose: Mucosal edema ( erythematous) and rhinorrhea (purulent) present.  Mouth/Throat: Uvula is midline, oropharynx is clear and moist and mucous membranes are normal. No oropharyngeal exudate.  Eyes: Conjunctivae are normal.  Neck: Trachea normal. No tracheal tenderness present. No tracheal deviation present. No thyromegaly present.  Cardiovascular: Normal rate, regular rhythm, S1 normal, S2 normal and normal heart sounds.   No murmur heard. Pulmonary/Chest: Breath sounds normal. No stridor. No respiratory distress. He has no wheezes. He has no rales.  Musculoskeletal: He exhibits no edema.  Lymphadenopathy:       Head (right side): No tonsillar adenopathy present.       Head (left side): No tonsillar adenopathy present.    He has no cervical adenopathy.  Neurological: He is alert. Gait normal.  Skin: No rash noted. He is not diaphoretic. No erythema. Nails show  no clubbing.  Psychiatric: Mood and affect normal.    Diagnostics:    Spirometry was performed and demonstrated an FEV1 of 2.15 at 72 % of predicted.  The patient had an Asthma Control Test with the following results: ACT Total Score: 12.    Assessment and Plan:   1. Asthma, not well controlled, moderate persistent, with acute exacerbation   2. Other allergic rhinitis   3. Chronic sinusitis, unspecified location   4. LPRD (laryngopharyngeal reflux disease)     1. Continue to perform Allergen avoidance measures  2. Continue to Treat and prevent inflammation:   A. continue Symbicort 160 2 inhalation twice a day  B. continue OTC Nasacort or fluticasone one spray each nostril twice a day  3. Treat infection:   A. Depo-Medrol 80 IM delivered in clinic today  B. Augmentin 875 one tablet twice a day for the next 14 days  4. If needed:  A. Proventil HFA 2 puffs every 4-6 hours  B. nasal saline wash  C. over-the-counter antihistamine - Zyrtec/Allegra/Claritin  5. Continue to Treat reflux with OTC Nexium and tapering off caffeine and chocolate  6. Return to clinic in 6 months or earlier if problem  7. Annual fall flu vaccine every year  Samuel Chen appears to have a respiratory tract infection with respiratory tract inflammatory flare and we will treat him with the steroid and an antibiotic as noted above while he continues to use anti-inflammatory medications to address his upper and lower airway inflammatory condition and also continues to treat his reflux-induced respiratory disease. I will see him back in this clinic in 6 months or earlier if there is a problem.  Laurette Schimke, MD Allergy / Immunology [facility]

## 2017-07-02 ENCOUNTER — Encounter: Payer: Self-pay | Admitting: Allergy and Immunology

## 2017-08-19 ENCOUNTER — Ambulatory Visit: Payer: 59 | Admitting: Internal Medicine

## 2017-08-19 ENCOUNTER — Encounter (INDEPENDENT_AMBULATORY_CARE_PROVIDER_SITE_OTHER): Payer: Self-pay

## 2017-08-19 ENCOUNTER — Encounter: Payer: Self-pay | Admitting: Internal Medicine

## 2017-08-19 VITALS — BP 131/71 | HR 82 | Temp 98.0°F | Wt 162.2 lb

## 2017-08-19 DIAGNOSIS — J449 Chronic obstructive pulmonary disease, unspecified: Secondary | ICD-10-CM

## 2017-08-19 DIAGNOSIS — Z87891 Personal history of nicotine dependence: Secondary | ICD-10-CM | POA: Diagnosis not present

## 2017-08-19 DIAGNOSIS — Z8619 Personal history of other infectious and parasitic diseases: Secondary | ICD-10-CM | POA: Diagnosis not present

## 2017-08-19 DIAGNOSIS — J309 Allergic rhinitis, unspecified: Secondary | ICD-10-CM

## 2017-08-19 MED ORDER — LORATADINE 10 MG PO TABS: 10.0000 mg | ORAL_TABLET | Freq: Every day | ORAL | 2 refills | Status: DC

## 2017-08-19 MED ORDER — ALBUTEROL SULFATE HFA 108 (90 BASE) MCG/ACT IN AERS: 2.0000 | INHALATION_SPRAY | Freq: Four times a day (QID) | RESPIRATORY_TRACT | 3 refills | Status: DC | PRN

## 2017-08-19 MED ORDER — DIPHENHYDRAMINE HCL 50 MG PO TABS: 50.0000 mg | ORAL_TABLET | Freq: Every evening | ORAL | 0 refills | Status: DC | PRN

## 2017-08-19 NOTE — Assessment & Plan Note (Signed)
Assessment: Patient here today with symptoms consistent with rhinosinusitis.  No symptoms currently to suggest exacerbation of his COPD.  He was treated in late October for bacterial sinusitis with partial resolution of symptoms at that time.  He has had worsening of his congestion over the past week and a half.  He has been consistent with his Symbicort, Singulair, and intranasal steroid.  However, he has not been using frequent saline irrigation or antihistamine therapy.  Plan: - recommended frequent saline nasal irrigation.  Patient reports he has a Environmental consultanteti Pot at home - prescribed antihistamine with Claritin 10mg  daily.  Also prescribed Benadryl as needed at night time for allergies - albuterol inhaler refilled - continue Singulair and intranasal steroid - if symptoms do not improve with continued conservative treatment, patient may benefit from CT maxillofacial to determine if there is any anatomical abnormality contributing to his symptoms.

## 2017-08-19 NOTE — Progress Notes (Signed)
   CC: here for sinus congestion  HPI:  Samuel Chen is a 61 y.o. man with PMHx as below here today for complaint of sinus congestion.  He was seen by allergist in late October and treated for a sinus infection at that time with Augmentin x 14 days.  He reports that his symptoms improved while on treatment but never really went away.  Today, he is complaining of worse congestion in his sinuses and chest for about 1.5 weeks.  Reports runny nose, dry cough with occasional phlegm, and mild dyspnea.  Denies wheezing, fevers, chills, sick contacts.  Symptoms are not as bad as they were in October when he was treated with Augmentin.  He is afebrile here with stable vitals.  Past Medical History:  Diagnosis Date  . 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 Allergic rhinitis   . COPD (chronic obstructive pulmonary disease) (HCC)    Questionable diagnosis.  . Dyshidrotic eczema   . GERD (gastroesophageal reflux disease)   . Hyperlipidemia   . Hypertension   . Myalgia 11/2007   Likely 2/2 Pravachol  . Tobacco abuse    Review of Systems:  Please see pertinent ROS reviewed in HPI and problem based charting.   Physical Exam:  Vitals:   08/19/17 1658  BP: 131/71  Pulse: 82  Temp: 98 F (36.7 C)  TempSrc: Oral  SpO2: 99%  Weight: 162 lb 3.2 oz (73.6 kg)   Physical Exam  Constitutional: He is oriented to person, place, and time and well-developed, well-nourished, and in no distress.  HENT:  Head: Normocephalic and atraumatic.  Mouth/Throat: No oropharyngeal exudate.  Cardiovascular: Normal rate and regular rhythm.  Pulmonary/Chest: Effort normal and breath sounds normal. He has no wheezes. He has no rales.  Neurological: He is alert and oriented to person, place, and time.  Skin: Skin is warm and dry.     Assessment & Plan:   See Encounters Tab for problem based charting.  Patient discussed with Dr. Rogelia BogaButcher.  Allergic rhinitis Assessment: Patient here today with  symptoms consistent with rhinosinusitis.  No symptoms currently to suggest exacerbation of his COPD.  He was treated in late October for bacterial sinusitis with partial resolution of symptoms at that time.  He has had worsening of his congestion over the past week and a half.  He has been consistent with his Symbicort, Singulair, and intranasal steroid.  However, he has not been using frequent saline irrigation or antihistamine therapy.  Plan: - recommended frequent saline nasal irrigation.  Patient reports he has a Environmental consultanteti Pot at home - prescribed antihistamine with Claritin 10mg  daily.  Also prescribed Benadryl as needed at night time for allergies - albuterol inhaler refilled - continue Singulair and intranasal steroid - if symptoms do not improve with continued conservative treatment, patient may benefit from CT maxillofacial to determine if there is any anatomical abnormality contributing to his symptoms.

## 2017-08-19 NOTE — Patient Instructions (Signed)
FOLLOW-UP INSTRUCTIONS When: in 2-3 weeks if not improved.  Otherwise, follow up with Dr Mikey BussingHoffman as scheduled For: sinus congestion What to bring: your medications  We have refilled your albuterol inhaler  I have ordered 2 allergy pills: Claritin to take every day for allergies and congestion along with Benadryl as needed to help with allergies.  Please use your Neti Pot frequently to help with sinus congestion.  Take Care.

## 2017-08-21 NOTE — Progress Notes (Signed)
Internal Medicine Clinic Attending  Case discussed with Dr. Earlene PlaterWallace at the time of the visit.  We reviewed the resident's history and exam and pertinent patient test results.  I agree with the assessment, diagnosis, and plan of care documented in the resident's note. Due to chronicity of sxs, would agree if CT scan sinuses if sxs do not improve on this regimen.

## 2017-09-01 ENCOUNTER — Ambulatory Visit: Payer: 59 | Admitting: Allergy & Immunology

## 2017-09-01 ENCOUNTER — Encounter: Payer: Self-pay | Admitting: Allergy & Immunology

## 2017-09-01 VITALS — BP 130/84 | HR 86 | Ht 63.5 in | Wt 164.0 lb

## 2017-09-01 DIAGNOSIS — J3089 Other allergic rhinitis: Secondary | ICD-10-CM | POA: Diagnosis not present

## 2017-09-01 DIAGNOSIS — J0100 Acute maxillary sinusitis, unspecified: Secondary | ICD-10-CM | POA: Diagnosis not present

## 2017-09-01 DIAGNOSIS — J4541 Moderate persistent asthma with (acute) exacerbation: Secondary | ICD-10-CM | POA: Diagnosis not present

## 2017-09-01 MED ORDER — LEVOFLOXACIN 500 MG PO TABS: 500.0000 mg | ORAL_TABLET | Freq: Every day | ORAL | 0 refills | Status: DC

## 2017-09-01 NOTE — Patient Instructions (Addendum)
1. Moderate persistent asthma, uncomplicated - Lung testing looked much worse than last time, but you sounded better following an albuterol nebulizer treatment.  - Start the prednisone pack provided today. - Increase Symbicort to two puffs twice daily and be sure to use a spacer. - We may need to consider treatment for silent reflux if there is no improvement at the next visit.  - Daily controller medication(s): Symbicort 160/4.685mcg two puffs twice daily with spacer - Prior to physical activity: ProAir 2 puffs 10-15 minutes before physical activity. - Rescue medications: ProAir 4 puffs every 4-6 hours as needed or albuterol nebulizer one vial puffs every 4-6 hours as needed - Asthma control goals:  * Full participation in all desired activities (may need albuterol before activity) * Albuterol use two time or less a week on average (not counting use with activity) * Cough interfering with sleep two time or less a month * Oral steroids no more than once a year * No hospitalizations  2. Perennial allergic rhinitis (dust mites) with overlying sinusitis - With your current symptoms and time course, antibiotics are needed: Levaquin 500mg  once daily for three weeks - Continue with nasal saline spray (i.e., Simply Saline) or nasal saline lavage (i.e., NeilMed) as needed prior to medicated nasal sprays. - For thick post nasal drainage, add guaifenesin [phone] mg (Mucinex)  twice daily as needed with adequate hydration. - If you are not feeling better at the next visit, we may need to get a sinus CT scan to see what else might be going on.  3. Return in about 4 weeks (around 09/29/2017).  Please inform us of any Emergency Department visits, hospitalizations, or changes in symptoms. Call us before going to the ED for breathing or allergy symptoms since we might be able to fit you in for a sick visit. Feel free to contact us anytime with any questions, problems, or concerns.  It was a pleasure to meet  you today! Enjoy the holiday season!  Websites that have reliable patient information: 1. American Academy of Asthma, Allergy, and Immunology: www.aaaai.org 2. Food Allergy Research and Education (FARE): foodallergy.org 3. Mothers of Asthmatics: http://www.asthmacommunitynetwork.org 4. American College of Allergy, Asthma, and Immunology: www.acaai.org

## 2017-09-01 NOTE — Progress Notes (Signed)
FOLLOW UP  Date of Service/Encounter:  09/01/2017   Assessment:   Moderate persistent asthma with acute exacerbation  Perennial allergic rhinitis (dust mites)  Acute sinusitis  Plan/Recommendations:   1. Moderate persistent asthma with acute exacerbation - Lung testing looked much worse than last time, but you sounded better following an albuterol nebulizer treatment.  - Start the prednisone pack provided today. - Increase Symbicort to two puffs twice daily and be sure to use a spacer. - We may need to consider treatment for silent reflux if there is no improvement at the next visit.  - We may also consider full pulmonary function testing if there is no improvement with his lung function. - I did emphasize the need for frequent and regular follow up to make sure that we maintain his symptom control.  - Daily controller medication(s): Symbicort 160/4.795mcg two puffs twice daily with spacer - Prior to physical activity: ProAir 2 puffs 10-15 minutes before physical activity. - Rescue medications: ProAir 4 puffs every 4-6 hours as needed or albuterol nebulizer one vial puffs every 4-6 hours as needed - Asthma control goals:  * Full participation in all desired activities (may need albuterol before activity) * Albuterol use two time or less a week on average (not counting use with activity) * Cough interfering with sleep two time or less a month * Oral steroids no more than once a year * No hospitalizations  2. Perennial allergic rhinitis (dust mites) with overlying sinusitis - ? partially treated with Augmentin - With your current symptoms and time course, antibiotics are needed: Levaquin 500mg  once daily for three weeks - Continue with nasal saline spray (i.e., Simply Saline) or nasal saline lavage (i.e., NeilMed) as needed prior to medicated nasal sprays. - For thick post nasal drainage, add guaifenesin [phone] mg (Mucinex)  twice daily as needed with adequate hydration. - If  you are not feeling better at the next visit, we may need to get a sinus CT scan to see what else might be going on in the sinus cavities.   3. Return in about 4 weeks (around 09/29/2017).   Subjective:   Samuel Chen is a 61 y.o. male presenting today for follow up of  Chief Complaint  Patient presents with  . Sinus Problem    Pt presents for concerns of ongoing sinus infection. Pt states he has finished anitbiotics however it is unresolved. Pt c/o pressure and pain in sinuses with HA and sneezing.    Samuel Chen has a history of the following: Patient Active Problem List   Diagnosis Date Noted  . Trochanteric bursitis, left hip 04/28/2017  . Pain in left hip 04/28/2017  . Prediabetes 04/06/2017  . Left hip pain 04/01/2016  . Normocytic anemia 08/16/2015  . Family history of MI (myocardial infarction) 08/10/2015  . Health care maintenance 05/18/2014  . Muscle cramps 08/13/2013  . Reactive cervical lymphadenopathy 04/13/2013  . Avascular necrosis of bone of right hip (HCC) 01/21/2013  . COPD (chronic obstructive pulmonary disease)[place] B   . Pain in joint, shoulder region 10/16/2009  . Allergic rhinitis 04/01/2007  . Hyperlipidemia 10/23/2006  . ERECTILE DYSFUNCTION 10/23/2006  . Alcohol abuse 10/23/2006  . History of tobacco abuse 10/23/2006  . Essential hypertension 10/23/2006  . Gastroesophageal reflux disease 10/23/2006  . 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 ALCOHOLIC HEPATITIS, HX OF 10/23/2006    History obtained from: chart review and patient.  Samuel Chen's Primary Care Provider is Samuel Chen, Samuel Ratliff, Samuel Chen.  [name] is a 61 y.o. male presenting for a sick visit. He was last seen in October 2018 by Dr. Lucie LeatherKozlow. At that time, he was reporting three weeks of nasal congestion. He was started on Augmentin one tablet twice daily for two weeks. He was also given a steroid injection of 80mg  DepoMedrol. Last allergy testing was performed in February 2017 and  was positive only to dust mites.   Since the last visit, he has not done well. He reports that he was feeling better. He did have some GI upset but he continued take it. He continued to have nasal congestion. He went to see his PCP where he was told over and over that it was allergies. He was placed on Benadryl and Claritin with minmal improvement. He continues to endorse sinus pain and pressure. He points to his frontal area and maxillary sinus tenderness today. He denies fevers but has not checked for it either. He will occasionally get chills.   He remains on his Symbicort one puff twice daily via a spacer. He also has a rescue inhaler which is providing some relief. He is using fluticasone one spray BID. He does use a Netti pot, but not routinely. He did try Mucinex as well.   Asthma/Respiratory Symptom History:   Allergic Rhinitis Symptom History:    Food Allergy Symptom History:   Otherwise, there have been no changes to his past medical history, surgical history, family history, or social history.    Review of Systems: a 14-point review of systems is pertinent for what is mentioned in HPI.  Otherwise, all other systems were negative. Constitutional: negative other than that listed in the HPI Eyes: negative other than that listed in the HPI Ears, nose, mouth, throat, and face: negative other than that listed in the HPI Respiratory: negative other than that listed in the HPI Cardiovascular: negative other than that listed in the HPI Gastrointestinal: negative other than that listed in the HPI Genitourinary: negative other than that listed in the HPI Integument: negative other than that listed in the HPI Hematologic: negative other than that listed in the HPI Musculoskeletal: negative other than that listed in the HPI Neurological: negative other than that listed in the HPI Allergy/Immunologic: negative other than that listed in the HPI    Objective:   Blood pressure 130/84,  pulse 86, height 5' 3.5" (1.613 m), weight 164 lb (74.4 kg), SpO2 97 %. Body mass index is 28.6 kg/m.   Physical Exam:  General: Alert, interactive, in no acute distress. Eyes: No conjunctival injection bilaterally, no discharge on the right, no discharge on the left and no Horner-Trantas dots present. PERRL bilaterally. EOMI without pain. No photophobia.  Ears: Right TM pearly gray with normal light reflex, Left TM pearly gray with normal light reflex, Right TM intact without perforation and Left TM intact without perforation.  Nose/Throat: External nose within normal limits, nasal crease present and septum midline. Bilateral frontal and maxillary sinusitis. Turbinates edematous with thick discharge. Posterior oropharynx erythematous without cobblestoning in the posterior oropharynx. Tonsils 2+ without exudates.  Tongue without thrush. Adenopathy: no enlarged lymph nodes appreciated in the anterior cervical, occipital, axillary, epitrochlear, inguinal, or popliteal regions. Lungs: Decreased breath sounds with expiratory wheezing bilaterally Decreased breath sounds at the lung bases. No increased work of breathing. CV: Normal S1/S2. No murmurs. Capillary refill <2 seconds.  Skin: Warm and dry, without lesions or rashes. Neuro:   Grossly intact. No focal deficits appreciated. Responsive to questions.  Diagnostic studies:  Spirometry: results abnormal (FEV1: 1.85/63%, FVC: 2.51/65%, FEV1/FVC: 74%).    Spirometry consistent with possible restrictive disease. Xopenex/Atrovent nebulizer treatment given in clinic with no improvement. In fact, his values worsened. This occurred on all three times that we attempted to get values via the spirometry. However, he did improved with increased air movement at the bases.   Allergy Studies: none      Malachi Bonds, MD [facility] [place]

## 2017-09-02 ENCOUNTER — Encounter: Payer: Self-pay | Admitting: Allergy & Immunology

## 2017-09-29 ENCOUNTER — Ambulatory Visit: Payer: 59 | Admitting: Allergy & Immunology

## 2017-09-29 ENCOUNTER — Other Ambulatory Visit: Payer: Self-pay | Admitting: *Deleted

## 2017-09-29 MED ORDER — ATENOLOL 50 MG PO TABS: 50.0000 mg | ORAL_TABLET | Freq: Every day | ORAL | 2 refills | Status: DC

## 2017-11-13 ENCOUNTER — Ambulatory Visit (INDEPENDENT_AMBULATORY_CARE_PROVIDER_SITE_OTHER): Payer: 59 | Admitting: Internal Medicine

## 2017-11-13 VITALS — BP 142/81 | HR 72 | Temp 97.6°F | Ht 63.5 in | Wt 158.6 lb

## 2017-11-13 DIAGNOSIS — M7062 Trochanteric bursitis, left hip: Secondary | ICD-10-CM | POA: Diagnosis not present

## 2017-11-13 DIAGNOSIS — Z79899 Other long term (current) drug therapy: Secondary | ICD-10-CM

## 2017-11-13 DIAGNOSIS — R252 Cramp and spasm: Secondary | ICD-10-CM | POA: Diagnosis not present

## 2017-11-13 DIAGNOSIS — J069 Acute upper respiratory infection, unspecified: Secondary | ICD-10-CM

## 2017-11-13 DIAGNOSIS — L301 Dyshidrosis [pompholyx]: Secondary | ICD-10-CM

## 2017-11-13 DIAGNOSIS — I10 Essential (primary) hypertension: Secondary | ICD-10-CM

## 2017-11-13 DIAGNOSIS — R52 Pain, unspecified: Secondary | ICD-10-CM | POA: Diagnosis not present

## 2017-11-13 DIAGNOSIS — R062 Wheezing: Secondary | ICD-10-CM | POA: Diagnosis not present

## 2017-11-13 DIAGNOSIS — Z791 Long term (current) use of non-steroidal anti-inflammatories (NSAID): Secondary | ICD-10-CM

## 2017-11-13 DIAGNOSIS — R05 Cough: Secondary | ICD-10-CM

## 2017-11-13 MED ORDER — OSELTAMIVIR PHOSPHATE 75 MG PO CAPS: 75.0000 mg | ORAL_CAPSULE | Freq: Two times a day (BID) | ORAL | 0 refills | Status: AC

## 2017-11-13 MED ORDER — CLOBETASOL PROPIONATE 0.05 % EX CREA: TOPICAL_CREAM | CUTANEOUS | 2 refills | Status: DC

## 2017-11-13 MED ORDER — CYCLOBENZAPRINE HCL 10 MG PO TABS: 10.0000 mg | ORAL_TABLET | Freq: Every day | ORAL | 2 refills | Status: DC | PRN

## 2017-11-13 MED ORDER — OLMESARTAN MEDOXOMIL 20 MG PO TABS: 20.0000 mg | ORAL_TABLET | Freq: Every day | ORAL | 1 refills | Status: DC

## 2017-11-13 MED ORDER — MELOXICAM 15 MG PO TABS: ORAL_TABLET | ORAL | 0 refills | Status: DC

## 2017-11-13 NOTE — Assessment & Plan Note (Signed)
Patient describes

## 2017-11-13 NOTE — Progress Notes (Signed)
   CC: Cough, Refills  HPI:  [name] is a 62 y.o. M with PMHx listed below presenting for Cough, Refills. Please see the A&P for the status of the patient's chronic medical problems.  Patient reports 2 days of cough (productive of clear/yellow sputum), rhinorrhea, bodyaches, weakness and chills. He has taken some ibuprofen for the body aches with some improvement. He endorse some mild shortness of breath with coughing, but states he has not needed to use his albuterol inhaler. He did get the flu vaccine this year. He denies fevers.  Past Medical History:  Diagnosis Date  . 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 Allergic rhinitis   . COPD (chronic obstructive pulmonary disease) (HCC)    Questionable diagnosis.  . Dyshidrotic eczema   . GERD (gastroesophageal reflux disease)   . Hyperlipidemia   . Hypertension   . Myalgia 11/2007   Likely 2/2 Pravachol  . Tobacco abuse    Review of Systems: Performed and all others negative.  Physical Exam:  Vitals:   11/13/17 1046 11/13/17 1102  BP: (!) 165/77 (!) 142/81  Pulse: 76 72  Temp: 97.6 F (36.4 C)   TempSrc: Oral   SpO2: 100%   Weight: 158 lb 9.6 oz (71.9 kg)   Height: 5' 3.5" (1.613 m)    Physical Exam  Constitutional: He appears well-developed and well-nourished. No distress.  HENT:  Head: Normocephalic and atraumatic.  Mouth/Throat: No oropharyngeal exudate.  Cardiovascular: Normal rate, regular rhythm, normal heart sounds and intact distal pulses.  Pulmonary/Chest: Effort normal.  End expiratory wheeze  Abdominal: Soft. Bowel sounds are normal. He exhibits no distension.  Skin: Skin is warm and dry.   Assessment & Plan:   See Encounters Tab for problem based charting.  Patient discussed with Dr. Sandre Kittyaines

## 2017-11-13 NOTE — Assessment & Plan Note (Signed)
Patient requesting refill of clobetasol cream. Refill to be provided - Clobetasol Cream, Apply BID to itchy areas

## 2017-11-13 NOTE — Assessment & Plan Note (Signed)
Patient reports 2 days of cough (productive of clear/yellwo sputum), rhinorrhea, bodyaches, weakness and chills. He has taken some ibuprofen for the body aches with some improvement. He endorse some mild shortness of breath with coughing, but states he has not needed to use his albuterol inhaler. He did get the flu vaccine this year. He denies fevers. -----

## 2017-11-13 NOTE — Assessment & Plan Note (Signed)
BP elevated today at 142/81 on recheck. Patient states he has been taking his medications, but needs a refill of his Olmesartan today. Will provide refill and defer medication adjustment to PCP. - Olmesartan 20mg  Daily - Atenolol 50mg  Daily - Follow up in PCP in about 1 month

## 2017-11-13 NOTE — Assessment & Plan Note (Signed)
Patient requesting a refill of his flexeril for his chronic muscle cramps. Will provide refills - Flexeril 10mg  Daily, PRN Muscle Spasms

## 2017-11-13 NOTE — Patient Instructions (Addendum)
Thank you for allowing us to care for you  For your cough and other respiratory symptoms - Your symptoms are due to a viral infection likely the flu - We have prescribed Tamiflu for 5 days - Use over the counter medications that work for you as needed  [place] to clinic if symptoms worsen or fail to improved after 1 week

## 2017-11-13 NOTE — Assessment & Plan Note (Signed)
Patient requesting refill of Meloxicam for hip pain. Will provide 30 day refill today. Patient to receive further refills from PCP. - Meloxicam 15mg  PRN

## 2017-11-13 NOTE — Assessment & Plan Note (Signed)
Patient reports 2 days of cough (productive of clear/yellow sputum), rhinorrhea, bodyaches, weakness and chills. He has taken some ibuprofen for the body aches with some improvement. He endorse some mild shortness of breath with coughing, but states he has not needed to use his albuterol inhaler. He did get the flu vaccine this year. He denies fevers.  On exam patient had mild expiratory wheeze. No pharyngeal exudate. Suspect Viral URI, likely flu given his body aches. - Tamiflu 75mg  x 5d - Patient to use OTC medications for symptom relief as directed if helpful - Return precautions given

## 2017-11-19 NOTE — Progress Notes (Signed)
Internal Medicine Clinic Attending  Case discussed with Dr. Melvin  at the time of the visit.  We reviewed the resident's history and exam and pertinent patient test results.  I agree with the assessment, diagnosis, and plan of care documented in the resident's note.  Alexander N Raines, MD   

## 2018-01-20 DIAGNOSIS — H2512 Age-related nuclear cataract, left eye: Secondary | ICD-10-CM | POA: Diagnosis not present

## 2018-02-06 DIAGNOSIS — H2511 Age-related nuclear cataract, right eye: Secondary | ICD-10-CM | POA: Diagnosis not present

## 2018-02-06 DIAGNOSIS — H2512 Age-related nuclear cataract, left eye: Secondary | ICD-10-CM | POA: Diagnosis not present

## 2018-03-27 ENCOUNTER — Other Ambulatory Visit: Payer: Self-pay | Admitting: Internal Medicine

## 2018-04-07 ENCOUNTER — Telehealth: Payer: Self-pay | Admitting: *Deleted

## 2018-04-07 NOTE — Telephone Encounter (Signed)
[facility] sends message olmesartan is not available at this time, please send alternate

## 2018-04-10 MED ORDER — VALSARTAN 160 MG PO TABS: 160.0000 mg | ORAL_TABLET | Freq: Every day | ORAL | 1 refills | Status: DC

## 2018-04-10 NOTE — Telephone Encounter (Signed)
I sent in Valsartan (Diovan) at a comparable dosage.  Thanks

## 2018-05-30 DIAGNOSIS — Z23 Encounter for immunization: Secondary | ICD-10-CM | POA: Diagnosis not present

## 2018-06-11 ENCOUNTER — Ambulatory Visit: Payer: 59 | Admitting: Internal Medicine

## 2018-06-11 ENCOUNTER — Other Ambulatory Visit: Payer: Self-pay

## 2018-06-11 ENCOUNTER — Encounter: Payer: Self-pay | Admitting: Internal Medicine

## 2018-06-11 VITALS — BP 137/73 | HR 85 | Temp 99.0°F | Wt 166.9 lb

## 2018-06-11 DIAGNOSIS — R238 Other skin changes: Secondary | ICD-10-CM | POA: Diagnosis not present

## 2018-06-11 DIAGNOSIS — I7090 Unspecified atherosclerosis: Secondary | ICD-10-CM | POA: Diagnosis not present

## 2018-06-11 DIAGNOSIS — N179 Acute kidney failure, unspecified: Secondary | ICD-10-CM

## 2018-06-11 DIAGNOSIS — Z79899 Other long term (current) drug therapy: Secondary | ICD-10-CM

## 2018-06-11 DIAGNOSIS — E785 Hyperlipidemia, unspecified: Secondary | ICD-10-CM

## 2018-06-11 DIAGNOSIS — I10 Essential (primary) hypertension: Secondary | ICD-10-CM

## 2018-06-11 LAB — POCT GLYCOSYLATED HEMOGLOBIN (HGB A1C): Hemoglobin A1C: 6.1 % — AB (ref 4.0–5.6)

## 2018-06-11 LAB — GLUCOSE, CAPILLARY: GLUCOSE-CAPILLARY: 96 mg/dL (ref 70–99)

## 2018-06-11 MED ORDER — ATENOLOL 50 MG PO TABS: 50.0000 mg | ORAL_TABLET | Freq: Every day | ORAL | 3 refills | Status: DC

## 2018-06-11 MED ORDER — SENNOSIDES 8.6 MG PO TABS: 1.0000 | ORAL_TABLET | Freq: Every day | ORAL | 0 refills | Status: DC

## 2018-06-11 MED ORDER — CYCLOBENZAPRINE HCL 10 MG PO TABS: 10.0000 mg | ORAL_TABLET | Freq: Every day | ORAL | 2 refills | Status: DC

## 2018-06-11 MED ORDER — BUDESONIDE-FORMOTEROL FUMARATE 160-4.5 MCG/ACT IN AERO: 2.0000 | INHALATION_SPRAY | Freq: Two times a day (BID) | RESPIRATORY_TRACT | 12 refills | Status: DC

## 2018-06-11 MED ORDER — VALSARTAN 160 MG PO TABS: 160.0000 mg | ORAL_TABLET | Freq: Every day | ORAL | 3 refills | Status: DC

## 2018-06-11 NOTE — Progress Notes (Signed)
   CC: follow-up on essential hypertension  HPI:  Samuel Chen is a 62 y.o. male with history noted below that presents to the internal medicine clinic for follow-up on essential hypertension. Please see problem based charting for the status of patient's chronic medical conditions.  Past Medical History:  Diagnosis Date  . 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 Allergic rhinitis   . COPD (chronic obstructive pulmonary disease) (HCC)    Questionable diagnosis.  . Dyshidrotic eczema   . GERD (gastroesophageal reflux disease)   . Hyperlipidemia   . Hypertension   . Myalgia 11/2007   Likely 2/2 Pravachol  . Tobacco abuse     Review of Systems:  Review of Systems  Constitutional: Negative for malaise/fatigue.  Respiratory: Negative for shortness of breath.   Cardiovascular: Negative for chest pain, palpitations, orthopnea, claudication and leg swelling.     Physical Exam:  Vitals:   06/11/18 1507  BP: 137/73  Pulse: 85  Temp: 99 F (37.2 C)  TempSrc: Oral  SpO2: 99%  Weight: 166 lb 14.4 oz (75.7 kg)   Physical Exam  Constitutional: He is well-developed, well-nourished, and in no distress.  Cardiovascular: Normal rate, regular rhythm and normal heart sounds. Exam reveals no gallop and no friction rub.  No murmur heard. No bruits noted  Pulmonary/Chest: Effort normal and breath sounds normal. No respiratory distress. He has no wheezes. He has no rales.  Skin:  Small bump on posterior scalp     Assessment & Plan:   See encounters tab for problem based medical decision making.    Patient discussed with Dr. Criselda Peaches

## 2018-06-11 NOTE — Patient Instructions (Addendum)
Samuel Chen,  It was a pleasure seeing you today. A referral to dermatology has been made. Imaging of your neck vessels has been placed and someone will call you. Your refills have been sent to your pharmacy.

## 2018-06-12 LAB — BMP8+ANION GAP
Anion Gap: 15 mmol/L (ref 10.0–18.0)
BUN / CREAT RATIO: 12 (ref 9–20)
BUN: 18 mg/dL (ref 6–24)
CALCIUM: 9.8 mg/dL (ref 8.7–10.2)
CHLORIDE: 102 mmol/L (ref 96–106)
CO2: 26 mmol/L (ref 20–29)
Creatinine, Ser: 1.51 mg/dL — ABNORMAL HIGH (ref 0.76–1.27)
GFR calc non Af Amer: 50 mL/min/{1.73_m2} — ABNORMAL LOW (ref >59)
GFR, EST AFRICAN AMERICAN: 58 mL/min/{1.73_m2} — AB (ref >59)
GLUCOSE: 93 mg/dL (ref 65–99)
POTASSIUM: 4.4 mmol/L (ref 3.5–5.2)
Sodium: 143 mmol/L (ref 134–144)

## 2018-06-12 LAB — LIPID PANEL
Chol/HDL Ratio: 3.3 ratio (ref 0.0–5.0)
Cholesterol, Total: 139 mg/dL (ref 100–199)
HDL: 42 mg/dL (ref >39)
LDL Calculated: 68 mg/dL (ref 0–99)
Triglycerides: 145 mg/dL (ref 0–149)
VLDL CHOLESTEROL CAL: 29 mg/dL (ref 5–40)

## 2018-06-18 ENCOUNTER — Telehealth: Payer: Self-pay | Admitting: *Deleted

## 2018-06-18 DIAGNOSIS — R238 Other skin changes: Secondary | ICD-10-CM | POA: Insufficient documentation

## 2018-06-18 NOTE — Assessment & Plan Note (Signed)
Assessment:  Skin colored papule Patient reports a bump on the back of his scalp and has been growing slowly for the past few years.  A flesh-colored papule is noted without concerning features.  He would like a dermatology referral.  Plan -dermatology referral

## 2018-06-18 NOTE — Assessment & Plan Note (Signed)
Assessment: Essential hypertension Patient currently takes valsartan 160 mg daily and atenolol 50 mg daily. Blood pressure today is 137/73, stable and at goal.  Plan -continue current management -Bmet  Addendum: BMET shows creatinine elevated at 1.5 will have patient come back to repeat in one month.

## 2018-06-18 NOTE — Telephone Encounter (Signed)
-----   Message from Camelia Phenes, [place] sent at 06/18/2018  3:23 PM EDT ----- Purnell Shoemaker,  Could you please let Mr. Swindle know that his kidney function was a little elevated and this could be a result of his medication (bp med valsartan).  I would like to repeat it in a month (he has a future order) and he can come by the clinic anytime to have this done.  If its still elevated we will change his blood pressure medication.  Thanks! -Geralyn Corwin

## 2018-06-18 NOTE — Assessment & Plan Note (Signed)
Assessment: hyperlipidemia Patient visited the dentist and was told on imaging he had plaques in his vessels. He requests imaging be done to further assess Plaque in his arteries.  Patient is currently on Lipitor 40 mg daily. Will also check  hemoglobin A1c and lipid panel today.  Denies history of stroke.  Plan -Carotid Dopplers -Lipid panel -Hemoglobin A1c

## 2018-06-18 NOTE — Telephone Encounter (Signed)
Spoke with pt-he verbalized understanding.  Lab only appt given for 11/18/20149 at 10am.[place], Darlene Cassady10/17/20194:36 PM

## 2018-06-19 ENCOUNTER — Ambulatory Visit (HOSPITAL_COMMUNITY)
Admission: RE | Admit: 2018-06-19 | Discharge: 2018-06-19 | Disposition: A | Discharge status: Home / Self Care | Payer: 59 | Source: Ambulatory Visit | Attending: Internal Medicine | Admitting: Internal Medicine

## 2018-06-19 DIAGNOSIS — E785 Hyperlipidemia, unspecified: Secondary | ICD-10-CM | POA: Insufficient documentation

## 2018-06-19 NOTE — Progress Notes (Signed)
*  Preliminary Results* Carotid artery duplex has been completed. Bilateral internal carotid arteries are 1-39%. Vertebral arteries are patent with antegrade flow.  06/19/2018 3:28 PM  Waver Dibiasio Clare Gandy

## 2018-06-23 NOTE — Progress Notes (Signed)
Internal Medicine Clinic Attending  Case discussed with Dr. Hoffman at the time of the visit.  We reviewed the resident's history and exam and pertinent patient test results.  I agree with the assessment, diagnosis, and plan of care documented in the resident's note.  

## 2018-07-20 ENCOUNTER — Encounter (INDEPENDENT_AMBULATORY_CARE_PROVIDER_SITE_OTHER): Payer: Self-pay

## 2018-07-20 ENCOUNTER — Other Ambulatory Visit (INDEPENDENT_AMBULATORY_CARE_PROVIDER_SITE_OTHER): Payer: 59

## 2018-07-20 DIAGNOSIS — N179 Acute kidney failure, unspecified: Secondary | ICD-10-CM | POA: Diagnosis not present

## 2018-07-21 LAB — BMP8+ANION GAP
ANION GAP: 17 mmol/L (ref 10.0–18.0)
BUN/Creatinine Ratio: 15 (ref 9–20)
BUN: 16 mg/dL (ref 6–24)
CALCIUM: 9.3 mg/dL (ref 8.7–10.2)
CO2: 21 mmol/L (ref 20–29)
CREATININE: 1.08 mg/dL (ref 0.76–1.27)
Chloride: 99 mmol/L (ref 96–106)
GFR calc Af Amer: 86 mL/min/{1.73_m2} (ref >59)
GFR calc non Af Amer: 75 mL/min/{1.73_m2} (ref >59)
Glucose: 127 mg/dL — ABNORMAL HIGH (ref 65–99)
POTASSIUM: 3.8 mmol/L (ref 3.5–5.2)
SODIUM: 137 mmol/L (ref 134–144)

## 2018-07-27 ENCOUNTER — Other Ambulatory Visit: Payer: Self-pay

## 2018-07-27 ENCOUNTER — Encounter: Payer: Self-pay | Admitting: Internal Medicine

## 2018-07-27 ENCOUNTER — Ambulatory Visit: Payer: 59 | Admitting: Internal Medicine

## 2018-07-27 ENCOUNTER — Encounter (INDEPENDENT_AMBULATORY_CARE_PROVIDER_SITE_OTHER): Payer: Self-pay

## 2018-07-27 VITALS — BP 140/64 | HR 92 | Temp 98.2°F | Ht 64.0 in | Wt 164.4 lb

## 2018-07-27 DIAGNOSIS — R300 Dysuria: Secondary | ICD-10-CM

## 2018-07-27 DIAGNOSIS — B961 Klebsiella pneumoniae [K. pneumoniae] as the cause of diseases classified elsewhere: Secondary | ICD-10-CM

## 2018-07-27 DIAGNOSIS — N390 Urinary tract infection, site not specified: Secondary | ICD-10-CM | POA: Diagnosis not present

## 2018-07-27 DIAGNOSIS — R319 Hematuria, unspecified: Secondary | ICD-10-CM | POA: Diagnosis not present

## 2018-07-27 DIAGNOSIS — N3001 Acute cystitis with hematuria: Secondary | ICD-10-CM

## 2018-07-27 MED ORDER — SULFAMETHOXAZOLE-TRIMETHOPRIM 800-160 MG PO TABS: 1.0000 | ORAL_TABLET | Freq: Two times a day (BID) | ORAL | 0 refills | Status: DC

## 2018-07-27 NOTE — Patient Instructions (Addendum)
Thank you for coming to the clinic today. It was a pleasure to see you.   For your urinary tract infection, please take bactrim for seven days, call and let us know if your symptoms are not improved in the next week   FOLLOW-UP in 1 month with Dr. Mikey BussingHoffman or in the acute care clinic   Please call the internal medicine center clinic if you have any questions or concerns, we may be able to help and keep you from a long and expensive emergency room wait. Our clinic and after hours phone number is [phone], the best time to call is Monday through Friday 9 am to 4 pm but there is always someone available 24/7 if you have an emergency. If you need medication refills please notify your pharmacy one week in advance and they will send us a request.

## 2018-07-27 NOTE — Progress Notes (Addendum)
   CC: " I think I have a urinary tract infection"  HPI:  [name] is a 62 y.o. with PMH as listed below who presents for evaluation of symptoms of urinary tract infection.   Please see the assessment and plans for the status of the patient chronic medical problems.   Past Medical History:  Diagnosis Date  . 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 Allergic rhinitis   . COPD (chronic obstructive pulmonary disease) (HCC)    Questionable diagnosis.  . Dyshidrotic eczema   . GERD (gastroesophageal reflux disease)   . Hyperlipidemia   . Hypertension   . Myalgia 11/2007   Likely 2/2 Pravachol  . Tobacco abuse    Review of Systems:  Refer to history of present illness and assessment and plans for pertinent review of systems, all others reviewed and negative  Physical Exam:  Vitals:   07/27/18 1607  BP: 140/64  Pulse: 92  Temp: 98.2 F (36.8 C)  TempSrc: Oral  SpO2: 100%  Weight: 164 lb 6.4 oz (74.6 kg)  Height: 5\' 4"  (1.626 m)   General: well appearing, no acute distress  Cardiac: regular rate and rhythm, no murmur GI: the abdomen is soft, non distended, mild tenderness to palpation over the right lower quadrant, no guarding, tenderness to palpation over the lower back, paraspinal over the sacrum  Assessment & Plan:   Urinary tract infection  Three days ago he began having bilateral lower back pain associated with chills. The back pain is constant, non radiating The pain improved when he began drinking cranbery juice on that day. The following day his urine changed colors - it was orange initially, yesterday he noticed there was gross blood in the urine, today the color returned to a normal yellow. He has also had burning with urination and increased urinary frequency. He denies straining to urinate. He is not having abdominal pain, nausea or vomiting. He has not checked his temperature at home. Ibuprofen has worked to resolve the chills. Symptoms are more  consistent with lower urinary tract infection. Urinalysis shows leukocytes and moderate blood. Will treat with Bactrim double strength tablets twice daily for 7 day course.  -  Obtain urinalysis with microscopy and urine culture.  - follow up in one month for repeat urinalysis   ADDENDUM: urine culture revealed > 100000 colony forming units of klebsiella susceptible to Bactrim. I spoke with Mr. Samuel Chen two days after beginning the bactrim course and he his symptoms are already improving.   See Encounters Tab for problem based charting.  Patient discussed with Dr. Cleda DaubE. Hoffman

## 2018-07-27 NOTE — Assessment & Plan Note (Signed)
Three days ago he began having bilateral lower back pain associated with chills. The back pain is constant, non radiating The pain improved when he began drinking cranbery juice on that day. The following day his urine changed colors - it was orange initially, yesterday he noticed there was gross blood in the urine, today the color returned to a normal yellow. He has also had burning with urination and increased urinary frequency. He denies straining to urinate. He is not having abdominal pain, nausea or vomiting. He has not checked his temperature at home. Ibuprofen has worked to resolve the chills. Symptoms are more consistent with lower urinary tract infection. Urinalysis shows leukocytes and moderate blood. Will treat with Bactrim double strength tablets twice daily for 7 day course.  -  Obtain urinalysis with microscopy and urine culture.  - follow up in one month for repeat urinalysis

## 2018-07-28 LAB — URINALYSIS, COMPLETE
Bilirubin, UA: NEGATIVE
Glucose, UA: NEGATIVE
Ketones, UA: NEGATIVE
Nitrite, UA: NEGATIVE
PH UA: 6 (ref 5.0–7.5)
Specific Gravity, UA: 1.015 (ref 1.005–1.030)
Urobilinogen, Ur: 1 mg/dL (ref 0.2–1.0)

## 2018-07-28 LAB — MICROSCOPIC EXAMINATION: CASTS: NONE SEEN /LPF

## 2018-07-28 LAB — SPECIMEN STATUS REPORT

## 2018-07-29 LAB — URINE CULTURE

## 2018-07-29 NOTE — Progress Notes (Signed)
Internal Medicine Clinic Attending  Case discussed with Dr. Blum at the time of the visit.  We reviewed the resident's history and exam and pertinent patient test results.  I agree with the assessment, diagnosis, and plan of care documented in the resident's note. 

## 2018-08-06 ENCOUNTER — Other Ambulatory Visit: Payer: Self-pay | Admitting: *Deleted

## 2018-08-06 DIAGNOSIS — L301 Dyshidrosis [pompholyx]: Secondary | ICD-10-CM

## 2018-08-10 MED ORDER — CLOBETASOL PROPIONATE 0.05 % EX CREA: TOPICAL_CREAM | CUTANEOUS | 2 refills | Status: DC

## 2018-09-15 ENCOUNTER — Ambulatory Visit: Payer: 59 | Admitting: Internal Medicine

## 2018-09-15 ENCOUNTER — Other Ambulatory Visit: Payer: Self-pay

## 2018-09-15 ENCOUNTER — Encounter (INDEPENDENT_AMBULATORY_CARE_PROVIDER_SITE_OTHER): Payer: Self-pay

## 2018-09-15 ENCOUNTER — Other Ambulatory Visit (HOSPITAL_COMMUNITY)
Admission: RE | Admit: 2018-09-15 | Discharge: 2018-09-15 | Disposition: A | Discharge status: Home / Self Care | Payer: 59 | Source: Ambulatory Visit | Attending: Internal Medicine | Admitting: Internal Medicine

## 2018-09-15 VITALS — BP 135/65 | HR 85 | Temp 98.2°F | Ht 64.0 in | Wt 158.9 lb

## 2018-09-15 DIAGNOSIS — N390 Urinary tract infection, site not specified: Secondary | ICD-10-CM | POA: Diagnosis not present

## 2018-09-15 DIAGNOSIS — R300 Dysuria: Secondary | ICD-10-CM | POA: Diagnosis not present

## 2018-09-15 DIAGNOSIS — Z87440 Personal history of urinary (tract) infections: Secondary | ICD-10-CM | POA: Diagnosis not present

## 2018-09-15 LAB — POCT URINALYSIS DIPSTICK
Bilirubin, UA: NEGATIVE
Glucose, UA: NEGATIVE
Ketones, UA: NEGATIVE
Nitrite, UA: POSITIVE
Protein, UA: NEGATIVE
Spec Grav, UA: 1.015 (ref 1.010–1.025)
Urobilinogen, UA: 0.2 E.U./dL
pH, UA: 5.5 (ref 5.0–8.0)

## 2018-09-15 MED ORDER — CEFDINIR 300 MG PO CAPS: 300.0000 mg | ORAL_CAPSULE | Freq: Two times a day (BID) | ORAL | 0 refills | Status: AC

## 2018-09-15 NOTE — Patient Instructions (Signed)
We'll start you on an antibiotic; Cefdinir, take 1 tab twice a day for 10 days.  We'll culture the urine and check a couple more tests to make sure we are treating the correct infection.   We'll also do a kidney ultrasound.

## 2018-09-15 NOTE — Assessment & Plan Note (Signed)
Patient with 1d h/o symptoms of dysuria and back pain, similar to prior UTI. Mild systemic symptoms, however able to tolerate PO intake, afebrile and HD stable today in clinic.   Dipstick UA +Leukocytes, +Nitrites, +trace blood consistent with UTI  Patient with recurrent complicated UTI w/o known underlying structural disease and no recent urologic procedures.   Plan: --cefdinir 300mg  BID x10 d --f/u Complete UA and urine culture --f/u urine GC/Chlamydia, trichomonas --f/u Renal U/S to assess for structural abnormalities that would increase chance of recurrent UTIs; if normal and has another episode of UTI in the future, would consider referral to urology for further work up --discussed return precautions

## 2018-09-15 NOTE — Progress Notes (Signed)
   CC: dysuria  HPI:  [name] is a 63 y.o. with a PMH listed below presenting to clinic for dysuria.  Patient endorses 1d history of dysuria, increased frequency, low back pain, nausea, and chills. He denies radiation of back pain, vomiting, suprapubic discomfort, hematuria, pyuria, or urethral discharge. Patient was treated in Nov for Kleb pneumoniae UTI. Prior to that episode, he has not had UTIs; he denies personal or family h/o of nephrolithiasis; he denies symptoms of BPH prior to onset of symptoms; he denies recent urinary procedures. He endorses consistent condom use with intercourse and is not concerned about STIs.   Please see problem based Assessment and Plan for status of patients chronic conditions.  Past Medical History:  Diagnosis Date  . 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 Allergic rhinitis   . COPD (chronic obstructive pulmonary disease) (HCC)    Questionable diagnosis.  . Dyshidrotic eczema   . GERD (gastroesophageal reflux disease)   . Hyperlipidemia   . Hypertension   . Myalgia 11/2007   Likely 2/2 Pravachol  . Tobacco abuse     Review of Systems:   Per HPI  Physical Exam:  Vitals:   09/15/18 1043  BP: 135/65  Pulse: 85  Temp: 98.2 F (36.8 C)  TempSrc: Oral  SpO2: 98%  Weight: 158 lb 14.4 oz (72.1 kg)  Height: 5\' 4"  (1.626 m)   GENERAL- alert, co-operative, appears as stated age, not in any distress. HEENT- oral mucosa appears moist. CARDIAC- RRR, no murmurs, rubs or gallops. RESP- Moving equal volumes of air, and clear to auscultation bilaterally, no wheezes or crackles. ABDOMEN- Soft, nontender, bowel sounds present. No CVA tenderness PSYCH- Normal mood and affect, appropriate thought content and speech.  Assessment & Plan:   See Encounters Tab for problem based charting.   Patient discussed with Dr. Ellamae Sia, MD Internal Medicine PGY-3

## 2018-09-16 LAB — MICROSCOPIC EXAMINATION
Casts: NONE SEEN /lpf
Epithelial Cells (non renal): NONE SEEN /hpf (ref 0–10)
WBC, UA: >30 /hpf — AB (ref 0–5)

## 2018-09-16 LAB — URINALYSIS, COMPLETE
Bilirubin, UA: NEGATIVE
Glucose, UA: NEGATIVE
Ketones, UA: NEGATIVE
Nitrite, UA: POSITIVE — AB
Specific Gravity, UA: 1.017 (ref 1.005–1.030)
Urobilinogen, Ur: 0.2 mg/dL (ref 0.2–1.0)
pH, UA: 5.5 (ref 5.0–7.5)

## 2018-09-16 LAB — URINE CYTOLOGY ANCILLARY ONLY
Chlamydia: NEGATIVE
Neisseria Gonorrhea: NEGATIVE
Trichomonas: NEGATIVE

## 2018-09-16 NOTE — Progress Notes (Signed)
Internal Medicine Clinic Attending  Case discussed with Dr. Svalina at the time of the visit.  We reviewed the resident's history and exam and pertinent patient test results.  I agree with the assessment, diagnosis, and plan of care documented in the resident's note.  Alexander Raines, M.D., Ph.D.  

## 2018-09-18 LAB — URINE CULTURE

## 2018-09-19 DIAGNOSIS — Z23 Encounter for immunization: Secondary | ICD-10-CM | POA: Diagnosis not present

## 2018-09-25 ENCOUNTER — Ambulatory Visit (HOSPITAL_COMMUNITY)
Admission: RE | Admit: 2018-09-25 | Discharge: 2018-09-25 | Disposition: A | Discharge status: Home / Self Care | Payer: 59 | Source: Ambulatory Visit | Attending: Internal Medicine | Admitting: Internal Medicine

## 2018-09-25 DIAGNOSIS — N390 Urinary tract infection, site not specified: Secondary | ICD-10-CM | POA: Diagnosis not present

## 2018-09-25 DIAGNOSIS — R300 Dysuria: Secondary | ICD-10-CM | POA: Insufficient documentation

## 2018-09-29 ENCOUNTER — Telehealth: Payer: Self-pay | Admitting: Internal Medicine

## 2018-09-29 NOTE — Telephone Encounter (Signed)
Attempted to reach patient about kidney ultrasound results, but no answer. Ultrasound did not reveal any abnormalities.  Samuel Chen

## 2018-09-30 ENCOUNTER — Telehealth: Payer: Self-pay

## 2018-09-30 ENCOUNTER — Other Ambulatory Visit: Payer: Self-pay | Admitting: Internal Medicine

## 2018-09-30 DIAGNOSIS — M7062 Trochanteric bursitis, left hip: Secondary | ICD-10-CM

## 2018-09-30 NOTE — Telephone Encounter (Signed)
Discussed results with patient. He has resolution of his UTI symptoms. He had no further questions.  Nyra Market

## 2018-09-30 NOTE — Telephone Encounter (Signed)
Requesting ultrasound result. Please call pt back.  

## 2018-10-19 ENCOUNTER — Other Ambulatory Visit: Payer: Self-pay

## 2018-10-19 ENCOUNTER — Ambulatory Visit: Payer: 59 | Admitting: Internal Medicine

## 2018-10-19 VITALS — BP 146/85 | HR 96 | Temp 96.0°F | Wt 157.8 lb

## 2018-10-19 DIAGNOSIS — Z87440 Personal history of urinary (tract) infections: Secondary | ICD-10-CM

## 2018-10-19 DIAGNOSIS — R300 Dysuria: Secondary | ICD-10-CM | POA: Diagnosis not present

## 2018-10-19 DIAGNOSIS — R3915 Urgency of urination: Secondary | ICD-10-CM

## 2018-10-19 DIAGNOSIS — R350 Frequency of micturition: Secondary | ICD-10-CM | POA: Diagnosis not present

## 2018-10-19 LAB — POCT URINALYSIS DIPSTICK
Glucose, UA: NEGATIVE
Ketones, UA: NEGATIVE
Nitrite, UA: NEGATIVE
PROTEIN UA: POSITIVE — AB
SPEC GRAV UA: 1.025 (ref 1.010–1.025)
Urobilinogen, UA: 1 E.U./dL
pH, UA: 6 (ref 5.0–8.0)

## 2018-10-19 MED ORDER — SULFAMETHOXAZOLE-TRIMETHOPRIM 400-80 MG PO TABS: 1.0000 | ORAL_TABLET | Freq: Two times a day (BID) | ORAL | 0 refills | Status: AC

## 2018-10-19 NOTE — Assessment & Plan Note (Addendum)
Patient presents with several days of dysuria associated with L flank pain, chills, and loss of appetite. This is his 3rd episode in the past 4 months and he reports his current symptoms are similar to prior episodes. Previous 2 urine cultures have grown Klebsiella pneumoniae R-ampicillin. He has been treated with Bactrim x7 days in 07/2018 and cefdinir x 10 days for 10 days. He experiences symptom relief after finishing antibiotic course, but symptoms recur. Renal US performed last month was normal. Testing was GC/chlamydia and trichomonas was negative. Urine dipstick shows small leukocytes and trace amount of blood. UA with microscopy and urine culture sent. Ordered Bactrim x 10 days and urology referral for further evaluation.

## 2018-10-19 NOTE — Patient Instructions (Signed)
Samuel Chen,   Please take Bactrim 1 tablet 2 times a day for your urinary tract infection.  We have placed a referral to the urologist for further evaluation.  You should receive a call from them this week to schedule an appointment.  Call us if you have any questions or concerns or if your symptoms worsen.  -Dr. Evelene Croon

## 2018-10-19 NOTE — Progress Notes (Signed)
   CC: Dysuria  HPI:  [name] is a 63 y.o. year-old male with PMH listed below who presents to clinic for dysuria. Please see problem based assessment and plan for further details.   Past Medical History:  Diagnosis Date  . 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 Allergic rhinitis   . COPD (chronic obstructive pulmonary disease) (HCC)    Questionable diagnosis.  . Dyshidrotic eczema   . GERD (gastroesophageal reflux disease)   . Hyperlipidemia   . Hypertension   . Myalgia 11/2007   Likely 2/2 Pravachol  . Tobacco abuse    Review of Systems:   Review of Systems  Constitutional: Positive for chills. Negative for fever and malaise/fatigue.  Genitourinary: Positive for dysuria, flank pain, frequency and urgency. Negative for hematuria.    Physical Exam: Vitals:   10/19/18 1319  BP: (!) 146/85  Pulse: 96  Temp: (!) 96 F (35.6 C)  TempSrc: Oral  SpO2: 100%  Weight: 157 lb 12.8 oz (71.6 kg)    General: Well-appearing male in no acute distress Cardiac: regular rate and rhythm, nl S1/S2, no murmurs, rubs or gallops Pulm: CTAB, no wheezes or crackles, no increased work of breathing on room air  Back: CVA tenderness on the left      Assessment & Plan:   See Encounters Tab for problem based charting.  Patient discussed with Dr. Cleda Daub

## 2018-10-20 ENCOUNTER — Encounter: Payer: Self-pay | Admitting: Internal Medicine

## 2018-10-20 LAB — URINALYSIS, ROUTINE W REFLEX MICROSCOPIC
Bilirubin, UA: NEGATIVE
Glucose, UA: NEGATIVE
Ketones, UA: NEGATIVE
Nitrite, UA: NEGATIVE
SPEC GRAV UA: 1.019 (ref 1.005–1.030)
Urobilinogen, Ur: 1 mg/dL (ref 0.2–1.0)
pH, UA: 5.5 (ref 5.0–7.5)

## 2018-10-20 LAB — MICROSCOPIC EXAMINATION
Casts: NONE SEEN /lpf
Epithelial Cells (non renal): NONE SEEN /hpf (ref 0–10)

## 2018-10-20 NOTE — Progress Notes (Signed)
Internal Medicine Clinic Attending  Case discussed with Dr. Santos-Sanchez at the time of the visit.  We reviewed the resident's history and exam and pertinent patient test results.  I agree with the assessment, diagnosis, and plan of care documented in the resident's note.    

## 2018-10-21 LAB — URINE CULTURE

## 2018-11-16 ENCOUNTER — Other Ambulatory Visit: Payer: Self-pay | Admitting: Internal Medicine

## 2018-11-18 DIAGNOSIS — Z23 Encounter for immunization: Secondary | ICD-10-CM | POA: Diagnosis not present

## 2018-11-24 DIAGNOSIS — N3020 Other chronic cystitis without hematuria: Secondary | ICD-10-CM | POA: Diagnosis not present

## 2018-11-24 DIAGNOSIS — N390 Urinary tract infection, site not specified: Secondary | ICD-10-CM | POA: Diagnosis not present

## 2018-11-24 DIAGNOSIS — N430 Encysted hydrocele: Secondary | ICD-10-CM | POA: Diagnosis not present

## 2018-11-24 DIAGNOSIS — B961 Klebsiella pneumoniae [K. pneumoniae] as the cause of diseases classified elsewhere: Secondary | ICD-10-CM | POA: Diagnosis not present

## 2018-12-01 DIAGNOSIS — N3020 Other chronic cystitis without hematuria: Secondary | ICD-10-CM | POA: Diagnosis not present

## 2018-12-14 DIAGNOSIS — N3020 Other chronic cystitis without hematuria: Secondary | ICD-10-CM | POA: Diagnosis not present

## 2018-12-14 DIAGNOSIS — Z125 Encounter for screening for malignant neoplasm of prostate: Secondary | ICD-10-CM | POA: Diagnosis not present

## 2018-12-24 ENCOUNTER — Telehealth: Payer: Self-pay | Admitting: Internal Medicine

## 2018-12-24 ENCOUNTER — Encounter: Payer: 59 | Admitting: Internal Medicine

## 2018-12-24 ENCOUNTER — Other Ambulatory Visit: Payer: Self-pay

## 2018-12-24 NOTE — Telephone Encounter (Signed)
Attempted to call Mr. Aspinall at home and at mobile number for tele-health visit with no answer.

## 2019-01-13 ENCOUNTER — Other Ambulatory Visit: Payer: Self-pay | Admitting: Internal Medicine

## 2019-03-01 ENCOUNTER — Encounter: Payer: Self-pay | Admitting: *Deleted

## 2019-03-03 ENCOUNTER — Other Ambulatory Visit: Payer: Self-pay | Admitting: Internal Medicine

## 2019-03-03 DIAGNOSIS — M7062 Trochanteric bursitis, left hip: Secondary | ICD-10-CM

## 2019-03-04 NOTE — Telephone Encounter (Signed)
Unclear why he would need refills on these. I do not see any indication given for the cyclobenzaprine, and no mention of the meloxicam in recent clinic notes. Can we check with him if he is still taking these?

## 2019-03-08 NOTE — Telephone Encounter (Signed)
Any update on this? Thank you!

## 2019-03-08 NOTE — Telephone Encounter (Signed)
Call placed to patient. States he's been taking these meds for years and is out. Has muscle spasms "all over body," especially feet, legs and back. Takes meloxicam for muscle pain when he "works real hard." Works for [employer] at [employer] and states "everything there is hard." L. Silvano Rusk, RN, BSN

## 2019-03-31 ENCOUNTER — Other Ambulatory Visit: Payer: Self-pay | Admitting: Internal Medicine

## 2019-05-01 ENCOUNTER — Other Ambulatory Visit: Payer: Self-pay | Admitting: Internal Medicine

## 2019-05-03 NOTE — Telephone Encounter (Signed)
Next appt scheduled 9/3 with PCP. 

## 2019-05-06 ENCOUNTER — Ambulatory Visit: Payer: 59 | Admitting: Internal Medicine

## 2019-05-06 ENCOUNTER — Encounter: Payer: Self-pay | Admitting: Internal Medicine

## 2019-05-06 ENCOUNTER — Other Ambulatory Visit: Payer: Self-pay

## 2019-05-06 VITALS — BP 136/73 | HR 84 | Temp 98.3°F | Ht 64.0 in | Wt 172.7 lb

## 2019-05-06 DIAGNOSIS — Z79899 Other long term (current) drug therapy: Secondary | ICD-10-CM | POA: Diagnosis not present

## 2019-05-06 DIAGNOSIS — I10 Essential (primary) hypertension: Secondary | ICD-10-CM

## 2019-05-06 DIAGNOSIS — K5904 Chronic idiopathic constipation: Secondary | ICD-10-CM

## 2019-05-06 DIAGNOSIS — Z0000 Encounter for general adult medical examination without abnormal findings: Secondary | ICD-10-CM

## 2019-05-06 DIAGNOSIS — M7062 Trochanteric bursitis, left hip: Secondary | ICD-10-CM

## 2019-05-06 DIAGNOSIS — R7303 Prediabetes: Secondary | ICD-10-CM

## 2019-05-06 MED ORDER — BUDESONIDE-FORMOTEROL FUMARATE 160-4.5 MCG/ACT IN AERO: 2.0000 | INHALATION_SPRAY | Freq: Two times a day (BID) | RESPIRATORY_TRACT | 12 refills | Status: DC

## 2019-05-06 MED ORDER — MELOXICAM 15 MG PO TABS: ORAL_TABLET | ORAL | 3 refills | Status: DC

## 2019-05-06 MED ORDER — SENNOSIDES 8.6 MG PO TABS: 1.0000 | ORAL_TABLET | Freq: Every day | ORAL | 0 refills | Status: DC

## 2019-05-06 MED ORDER — ATENOLOL 50 MG PO TABS: 50.0000 mg | ORAL_TABLET | Freq: Every day | ORAL | 3 refills | Status: DC

## 2019-05-06 MED ORDER — POLYETHYLENE GLYCOL 3350 17 G PO PACK: 17.0000 g | PACK | Freq: Two times a day (BID) | ORAL | 0 refills | Status: DC

## 2019-05-06 MED ORDER — VALSARTAN 160 MG PO TABS: 160.0000 mg | ORAL_TABLET | Freq: Every day | ORAL | 3 refills | Status: DC

## 2019-05-06 MED ORDER — ALBUTEROL SULFATE HFA 108 (90 BASE) MCG/ACT IN AERS: INHALATION_SPRAY | RESPIRATORY_TRACT | 3 refills | Status: DC

## 2019-05-06 MED ORDER — CYCLOBENZAPRINE HCL 10 MG PO TABS: 10.0000 mg | ORAL_TABLET | Freq: Every day | ORAL | 5 refills | Status: DC

## 2019-05-06 NOTE — Progress Notes (Signed)
   CC: constipation  HPI:  Samuel Chen is a 63 y.o. M with significant PMH as stated below. Please see problem based charting for additional information.  Past Medical History:  Diagnosis Date  . 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 Allergic rhinitis   . COPD (chronic obstructive pulmonary disease) (HCC)    Questionable diagnosis.  . Dyshidrotic eczema   . GERD (gastroesophageal reflux disease)   . Hyperlipidemia   . Hypertension   . Myalgia 11/2007   Likely 2/2 Pravachol  . Tobacco abuse    Review of Systems:   Review of Systems  Constitutional: Negative for chills and fever.  Respiratory: Negative for shortness of breath.   Cardiovascular: Negative for chest pain.  Gastrointestinal: Positive for abdominal pain and constipation. Negative for blood in stool, diarrhea, melena, nausea and vomiting.  Genitourinary: Negative for dysuria and urgency.  Musculoskeletal: Negative for back pain.   Physical Exam:  Vitals:   05/06/19 1420  BP: 136/73  Pulse: 84  Temp: 98.3 F (36.8 C)  TempSrc: Oral  SpO2: 97%  Weight: 172 lb 11.2 oz (78.3 kg)  Height: 5\' 4"  (1.626 m)   Physical Exam Constitutional:      General: He is not in acute distress. Cardiovascular:     Rate and Rhythm: Normal rate and regular rhythm.     Heart sounds: Normal heart sounds.  Pulmonary:     Effort: Pulmonary effort is normal.     Breath sounds: Normal breath sounds.  Abdominal:     General: Bowel sounds are normal. There is no distension.     Palpations: Abdomen is soft.     Tenderness: There is no abdominal tenderness. There is no guarding or rebound.  Neurological:     Mental Status: He is alert.    Assessment & Plan:   See Encounters Tab for problem based charting.  Patient seen with Dr. Philipp Ovens

## 2019-05-06 NOTE — Assessment & Plan Note (Signed)
Last A1c on 06/2018 was 6.1.  Plan - recheck A1c today

## 2019-05-06 NOTE — Assessment & Plan Note (Signed)
Pt declined influenza vaccination today. States he is scheduled to get one at his pharmacy next week.

## 2019-05-06 NOTE — Assessment & Plan Note (Addendum)
Pt endorses having chronic constipation. He says it has been going on for the past several months. States he has a small bowel movement every day or every other day. These stools are hard, but denies them ever being black or red. He says there is some abdominal cramping that subsides with a bowel movement. He has tried different over the counter laxatives (he is unsure which ones), and none have helped. Denies any changes to bladder habits or urinary frequency. He is not on chronic opioids. He eats a "regular diet," with most recent meal being a bologna sandwich. Had a colonoscopy in 2011, no results uploaded to the chart but pt denies any findings. Pt has had a 15lb weight gain in the last 6 months. Denies fatigue, dry skin, or muscle weakness.  Assessment - Chronic constipation Etiology unclear, will investigate with lab work though suspected to be related to decreased dietary fiber  - ordered TSH and CMP  - increase over the counter miralax to twice a day - prescribed Senokot daily - instructed pt to call the office or go to the emergency room if he stops having bowel movements, experiences worsening/severe abdominal pain, or sees blood in the stool  Addendum on 9/8 Called pt back to notify him on lab results. Pt said he has not seen improvement in his bowel movements. Stated he is only taking the Senokot pill once a day. Advised pt to also take Miralax twice a day and see if this help. Also, notified pt of elevated AST/ALT. Pt says that he drinks 3-4 beers per day. Instructed pt to cut down on his alcohol intake and will plan to repeat CMP at his next follow-up appointment. Pt was understanding and agreeable.

## 2019-05-06 NOTE — Assessment & Plan Note (Signed)
Pt taking valsartan and atenolol for blood pressure control. BP today at goal.  Assessment - Essential hypertension, well controlled - continue current medication regimen   BP Readings from Last 3 Encounters:  05/06/19 136/73  10/19/18 (!) 146/85  09/15/18 135/65

## 2019-05-06 NOTE — Patient Instructions (Addendum)
Mr. Balding,  It was nice meeting you today! Please take Miralax twice a day and Senokot once a day to improve your constipation. You should also increase fiber in your diet with whole grains, beans, and vegetables. You can buy psyllium fiber over the counter to increase your fiber intake as well. Please call the office if these changes do not improve your bowel movements. If you stop having bowel movements, begin experiencing severe abdominal pain, or you see blood in your stool, please call the office or go to the emergency room  I will call you if any of your lab work from today is abnormal.    Constipation, Adult Constipation is when a person:  Poops (has a bowel movement) fewer times in a week than normal.  Has a hard time pooping.  Has poop that is dry, hard, or bigger than normal. Follow these instructions at home: Eating and drinking   Eat foods that have a lot of fiber, such as: ? Fresh fruits and vegetables. ? Whole grains. ? Beans.  Eat less of foods that are high in fat, low in fiber, or overly processed, such as: ? [place] fries. ? Hamburgers. ? Cookies. ? Candy. ? Soda.  Drink enough fluid to keep your pee (urine) clear or pale yellow. General instructions  Exercise regularly or as told by your doctor.  Go to the restroom when you feel like you need to poop. Do not hold it in.  Take over-the-counter and prescription medicines only as told by your doctor. These include any fiber supplements.  Do pelvic floor retraining exercises, such as: ? Doing deep breathing while relaxing your lower belly (abdomen). ? Relaxing your pelvic floor while pooping.  Watch your condition for any changes.  Keep all follow-up visits as told by your doctor. This is important. Contact a doctor if:  You have pain that gets worse.  You have a fever.  You have not pooped for 4 days.  You throw up (vomit).  You are not hungry.  You lose weight.  You are bleeding from the  anus.  You have thin, pencil-like poop (stool). Get help right away if:  You have a fever, and your symptoms suddenly get worse.  You leak poop or have blood in your poop.  Your belly feels hard or bigger than normal (is bloated).  You have very bad belly pain.  You feel dizzy or you faint. This information is not intended to replace advice given to you by your health care provider. Make sure you discuss any questions you have with your health care provider. Document Released: 02/05/2008 Document Revised: 08/01/2017 Document Reviewed: 02/07/2016 Elsevier Patient Education  2020 [employer].

## 2019-05-07 ENCOUNTER — Telehealth: Payer: Self-pay | Admitting: *Deleted

## 2019-05-07 DIAGNOSIS — M7062 Trochanteric bursitis, left hip: Secondary | ICD-10-CM

## 2019-05-07 LAB — CMP14 + ANION GAP
ALT: 88 IU/L — ABNORMAL HIGH (ref 0–44)
AST: 85 IU/L — ABNORMAL HIGH (ref 0–40)
Albumin/Globulin Ratio: 1.6 (ref 1.2–2.2)
Albumin: 4.4 g/dL (ref 3.8–4.9)
Alkaline Phosphatase: 67 IU/L (ref 39–117)
Anion Gap: 13 mmol/L (ref 10.0–18.0)
BUN/Creatinine Ratio: 15 (ref 10–24)
BUN: 17 mg/dL (ref 8–27)
Bilirubin Total: 0.3 mg/dL (ref 0.0–1.2)
CO2: 24 mmol/L (ref 20–29)
Calcium: 9.5 mg/dL (ref 8.6–10.2)
Chloride: 104 mmol/L (ref 96–106)
Creatinine, Ser: 1.17 mg/dL (ref 0.76–1.27)
GFR calc Af Amer: 78 mL/min/{1.73_m2} (ref >59)
GFR calc non Af Amer: 67 mL/min/{1.73_m2} (ref >59)
Globulin, Total: 2.8 g/dL (ref 1.5–4.5)
Glucose: 112 mg/dL — ABNORMAL HIGH (ref 65–99)
Potassium: 4.6 mmol/L (ref 3.5–5.2)
Sodium: 141 mmol/L (ref 134–144)
Total Protein: 7.2 g/dL (ref 6.0–8.5)

## 2019-05-07 LAB — HEMOGLOBIN A1C
Est. average glucose Bld gHb Est-mCnc: 134 mg/dL
Hgb A1c MFr Bld: 6.3 % — ABNORMAL HIGH (ref 4.8–5.6)

## 2019-05-07 LAB — TSH: TSH: 1.66 u[IU]/mL (ref 0.450–4.500)

## 2019-05-07 NOTE — Telephone Encounter (Signed)
Fax from [facility] - needs clarification of directions for Meloxicam take 1 tab by mouth as needed for pain. Need to know if it's 1 tab once a day  Or twice a day or how often. Please re-send rx Thanks

## 2019-05-07 NOTE — Progress Notes (Signed)
Internal Medicine Clinic Attending  I saw and evaluated the patient.  I personally confirmed the key portions of the history and exam documented by Dr. Jones and I reviewed pertinent patient test results.  The assessment, diagnosis, and plan were formulated together and I agree with the documentation in the resident's note.     

## 2019-05-11 MED ORDER — MELOXICAM 15 MG PO TABS: ORAL_TABLET | ORAL | 3 refills | Status: DC

## 2019-05-11 NOTE — Telephone Encounter (Signed)
Medication sent for refill to [place] [facility]

## 2019-11-22 ENCOUNTER — Other Ambulatory Visit: Payer: Self-pay | Admitting: *Deleted

## 2019-11-22 DIAGNOSIS — L301 Dyshidrosis [pompholyx]: Secondary | ICD-10-CM

## 2019-11-22 MED ORDER — CLOBETASOL PROPIONATE 0.05 % EX CREA: TOPICAL_CREAM | CUTANEOUS | 1 refills | Status: DC

## 2019-12-08 ENCOUNTER — Ambulatory Visit: Payer: 59 | Admitting: Internal Medicine

## 2019-12-08 ENCOUNTER — Other Ambulatory Visit: Payer: Self-pay

## 2019-12-08 ENCOUNTER — Encounter: Payer: Self-pay | Admitting: Internal Medicine

## 2019-12-08 VITALS — BP 133/79 | HR 70 | Temp 98.1°F | Ht 64.0 in | Wt 165.5 lb

## 2019-12-08 DIAGNOSIS — J310 Chronic rhinitis: Secondary | ICD-10-CM | POA: Insufficient documentation

## 2019-12-08 DIAGNOSIS — J328 Other chronic sinusitis: Secondary | ICD-10-CM | POA: Diagnosis not present

## 2019-12-08 DIAGNOSIS — J329 Chronic sinusitis, unspecified: Secondary | ICD-10-CM | POA: Insufficient documentation

## 2019-12-08 MED ORDER — CETIRIZINE HCL 10 MG PO TABS: 10.0000 mg | ORAL_TABLET | Freq: Every day | ORAL | 1 refills | Status: DC

## 2019-12-08 MED ORDER — AMOXICILLIN 500 MG PO CAPS: 500.0000 mg | ORAL_CAPSULE | Freq: Three times a day (TID) | ORAL | 0 refills | Status: AC

## 2019-12-08 MED ORDER — FLUTICASONE PROPIONATE 50 MCG/ACT NA SUSP: 2.0000 | Freq: Every day | NASAL | 2 refills | Status: DC

## 2019-12-08 NOTE — Progress Notes (Signed)
   CC: sinus infection  HPI:  Mr.Justine K Churchwell is a 64 y.o. M with significant PMH as outlined below, who presents for sinus pressure and headache. Please see problem-based charting for additional details.  Past Medical History:  Diagnosis Date  . 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 Allergic rhinitis   . COPD (chronic obstructive pulmonary disease) (HCC)    Questionable diagnosis.  . Dyshidrotic eczema   . GERD (gastroesophageal reflux disease)   . Hyperlipidemia   . Hypertension   . Myalgia 11/2007   Likely 2/2 Pravachol  . Tobacco abuse    Review of Systems:   Review of Systems  Constitutional: Negative for chills and fever.  HENT: Positive for congestion, sinus pain and sore throat. Negative for ear pain.   Eyes: Negative for pain and redness.  Respiratory: Negative for cough, shortness of breath and wheezing.   Cardiovascular: Negative for chest pain and palpitations.  Skin: Negative for itching.   Physical Exam:  Vitals:   12/08/19 1312  BP: 133/79  Pulse: 70  Temp: 98.1 F (36.7 C)  TempSrc: Oral  SpO2: 100%  Weight: 165 lb 8 oz (75.1 kg)  Height: 5\' 4"  (1.626 m)   Physical Exam Vitals and nursing note reviewed.  Constitutional:      General: He is not in acute distress.    Appearance: Normal appearance. He is not ill-appearing.  HENT:     Right Ear: Tympanic membrane, ear canal and external ear normal.     Left Ear: Tympanic membrane, ear canal and external ear normal.     Nose: Congestion and rhinorrhea present.     Right Sinus: Maxillary sinus tenderness and frontal sinus tenderness present.     Left Sinus: Maxillary sinus tenderness and frontal sinus tenderness present.     Comments: Turbinates swollen and erythematous    Mouth/Throat:     Mouth: Mucous membranes are moist.     Pharynx: Posterior oropharyngeal erythema present. No oropharyngeal exudate.     Comments: No post-nasal drip Eyes:     General: Allergic shiner present.    Cardiovascular:     Rate and Rhythm: Normal rate and regular rhythm.     Heart sounds: Normal heart sounds.  Pulmonary:     Effort: Pulmonary effort is normal. No respiratory distress.     Breath sounds: Normal breath sounds. No wheezing, rhonchi or rales.  Musculoskeletal:     Cervical back: Normal range of motion. No tenderness.  Lymphadenopathy:     Cervical: No cervical adenopathy.  Neurological:     Mental Status: He is alert.    Assessment & Plan:   See Encounters Tab for problem based charting.  Patient discussed with Dr. 

## 2019-12-08 NOTE — Patient Instructions (Addendum)
Samuel Chen,  It was nice to see you today!   For your sinus pressure - this is likely due to inflammation from your seasonal allergies. Start performing nasal irrigation at home with a Neti Pot to clean and clear out the sinuses. Please continue taking Flonase 2 puffs in each nostril daily. Begin taking cetirizine (Zyrtec) 10mg  daily, instead of Claritin.   If your symptoms do not improve by Saturday or Sunday, then please pick up a prescription for an antibiotic - Amoxicillin 500mg  three times a day for 5 days.  Thank you for letting Friday be a part of your care!

## 2019-12-09 NOTE — Assessment & Plan Note (Signed)
Pt presents with 2 day history of sinus pressure and headache. States these symptoms came on gradually with the change in weather. Pt has seasonal allergies and takes Flonase and Claritin daily year round. Does not routinely perform nasal irrigation. Endorses headache, scratchy throat, and rhinorrhea/congestion. Sinus pressure does not change/worsen with position. Denies fevers, chills, ear pain, cough, shortness of breath, wheezing, post-nasal drip, or trouble swallowing. On exam, pt is tender in frontal and maxillary sinuses bilaterally. Bilateral TMs unremarkable. Bilateral nasal turbinates swollen and erythematous. Posterior oropharynx erythematous without exudate or post-nasal drip.   Assessment  Acute rhinosinusitis, likely allergic instead of infectious in nature given seasonality and short time course. Discussed recommendations for nasal irrigation in addition to oral antihistamines and intranasal steroids. Instructed pt that his symptoms and examination do not show evidence of a bacterial sinusitis at this time. However, if symptoms fail to improve with above treatment, then he can pick up prescription for short course of antibiotics. Pt understanding and agreeable.  - nasal irrigation at home with a Neti Pot - continue taking Flonase 2 puffs in each nostril daily - start cetirizine (Zyrtec) 10mg  daily instead of Claritin - if symptoms do not improve in next 5 days, then take amoxicillin 500mg  TID for 5 days

## 2019-12-10 NOTE — Progress Notes (Signed)
Internal Medicine Clinic Attending  Case discussed with Dr. Jones at the time of the visit.  We reviewed the resident's history and exam and pertinent patient test results.  I agree with the assessment, diagnosis, and plan of care documented in the resident's note.  

## 2019-12-22 ENCOUNTER — Encounter: Payer: Self-pay | Admitting: *Deleted

## 2020-01-13 ENCOUNTER — Telehealth: Payer: Self-pay | Admitting: *Deleted

## 2020-01-13 NOTE — Telephone Encounter (Signed)
Received fax from [facility] stating Ventolin is not covered by insurance but ProAir or Proventil is. Please send new Rx. Thanks!

## 2020-01-14 MED ORDER — ALBUTEROL SULFATE HFA 108 (90 BASE) MCG/ACT IN AERS: 2.0000 | INHALATION_SPRAY | Freq: Four times a day (QID) | RESPIRATORY_TRACT | 1 refills | Status: DC | PRN

## 2020-01-14 NOTE — Telephone Encounter (Signed)
Proventil refill sent into [facility] on [address].

## 2020-05-16 ENCOUNTER — Other Ambulatory Visit: Payer: Self-pay | Admitting: Internal Medicine

## 2020-05-17 ENCOUNTER — Other Ambulatory Visit: Payer: Self-pay | Admitting: Internal Medicine

## 2020-05-18 ENCOUNTER — Other Ambulatory Visit: Payer: Self-pay | Admitting: Internal Medicine

## 2020-05-19 ENCOUNTER — Other Ambulatory Visit: Payer: Self-pay | Admitting: Internal Medicine

## 2020-05-23 ENCOUNTER — Other Ambulatory Visit: Payer: Self-pay | Admitting: Internal Medicine

## 2020-05-23 DIAGNOSIS — M7062 Trochanteric bursitis, left hip: Secondary | ICD-10-CM

## 2020-06-01 ENCOUNTER — Other Ambulatory Visit: Payer: Self-pay | Admitting: Internal Medicine

## 2020-06-05 ENCOUNTER — Other Ambulatory Visit: Payer: Self-pay | Admitting: Internal Medicine

## 2020-06-18 ENCOUNTER — Other Ambulatory Visit: Payer: Self-pay | Admitting: Internal Medicine

## 2020-07-04 ENCOUNTER — Encounter: Payer: Self-pay | Admitting: Internal Medicine

## 2020-07-04 ENCOUNTER — Ambulatory Visit (INDEPENDENT_AMBULATORY_CARE_PROVIDER_SITE_OTHER): Payer: 59 | Admitting: Internal Medicine

## 2020-07-04 ENCOUNTER — Other Ambulatory Visit: Payer: Self-pay

## 2020-07-04 DIAGNOSIS — I10 Essential (primary) hypertension: Secondary | ICD-10-CM | POA: Diagnosis not present

## 2020-07-04 DIAGNOSIS — J310 Chronic rhinitis: Secondary | ICD-10-CM | POA: Diagnosis not present

## 2020-07-04 DIAGNOSIS — E785 Hyperlipidemia, unspecified: Secondary | ICD-10-CM

## 2020-07-04 DIAGNOSIS — J449 Chronic obstructive pulmonary disease, unspecified: Secondary | ICD-10-CM

## 2020-07-04 DIAGNOSIS — J329 Chronic sinusitis, unspecified: Secondary | ICD-10-CM

## 2020-07-04 MED ORDER — SYMBICORT 160-4.5 MCG/ACT IN AERO: 2.0000 | INHALATION_SPRAY | Freq: Two times a day (BID) | RESPIRATORY_TRACT | 1 refills | Status: DC

## 2020-07-04 MED ORDER — ALBUTEROL SULFATE HFA 108 (90 BASE) MCG/ACT IN AERS: 2.0000 | INHALATION_SPRAY | Freq: Four times a day (QID) | RESPIRATORY_TRACT | 3 refills | Status: DC | PRN

## 2020-07-04 MED ORDER — ATENOLOL 50 MG PO TABS: 50.0000 mg | ORAL_TABLET | Freq: Every day | ORAL | 3 refills | Status: DC

## 2020-07-04 MED ORDER — VALSARTAN 160 MG PO TABS: 160.0000 mg | ORAL_TABLET | Freq: Every day | ORAL | 0 refills | Status: DC

## 2020-07-04 MED ORDER — ATORVASTATIN CALCIUM 40 MG PO TABS
ORAL_TABLET | ORAL | 5 refills | Status: DC
Start: 2020-07-04 — End: 2020-08-14

## 2020-07-04 NOTE — Assessment & Plan Note (Signed)
(  Televisit) Pt reports runny nose, eye pain, headache mostly when lean forward. Symptoms started a week ago. Advil did not help that much. Also has mild cough w whitish sputum. He reports having post nasal dragnage and endorses that it cause coughing. He felt he might have slight fever but he did not check it. He feels mild pain on his face if he push on her face under his eyes. Throat is a little itchy.   The cough is not too bad.  He does not feel very sick and he felt OK to work and did not miss his work. He has not received the booster but got fist covid-19 shot x 2 initially on May. No sick contact. No body ache, mild fatigue. He can taste and smell well.   He had prior similar symptoms before that thought to be 2/2 acute allergic/viral rhinosinositis and responded well to  Flonase and zyrtec. He reports that it worked last time and his symptoms are very similar to that presentation.  Will do conservative management with Zyrtec, Flonase and sinus irrigation for non bacterial rhinosinusitis. Not concerning for COVID-19 but instructed to come to clinic to be reevaluated if any worsening.   -Sending refill for flonase and zyrtec -Sinus irrigation  -If no improvement, come to clinic for further evaluation and will consider testing for COVID

## 2020-07-04 NOTE — Assessment & Plan Note (Signed)
(  Televisit) Send refill for albuterol and Symbicort per patient request.

## 2020-07-04 NOTE — Assessment & Plan Note (Signed)
(  Televisit) Patient asked for refill for statin. Sending refill for atorvastatin 40 mg daily.

## 2020-07-04 NOTE — Progress Notes (Signed)
  [facility] Internal Medicine Residency Telephone Encounter Continuity Care Appointment  HPI:   This telephone encounter was created for Mr. Samuel Chen on 07/04/2020 for the following purpose/cc sinus infection   Past Medical History:  Past Medical History:  Diagnosis Date  . 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 Allergic rhinitis   . COPD (chronic obstructive pulmonary disease) (HCC)    Questionable diagnosis.  . Dyshidrotic eczema   . GERD (gastroesophageal reflux disease)   . Hyperlipidemia   . Hypertension   . Myalgia 11/2007   Likely 2/2 Pravachol  . Tobacco abuse      Current Outpatient Medications on File Prior to Visit  Medication Sig Dispense Refill  . cetirizine (ZYRTEC) 10 MG tablet Take 1 tablet (10 mg total) by mouth daily. 90 tablet 1  . Cholecalciferol 1000 units tablet Take 1 tablet (1,000 Units total) by mouth daily. 30 tablet 2  . clobetasol cream (TEMOVATE) 0.05 % APPLY 1 APPLICATION TOPICALLY 2 TIMES DAILY. APPLY TO ITCHY AREAS. 30 g 1  . cyclobenzaprine (FLEXERIL) 10 MG tablet TAKE 1 TABLET BY MOUTH AT BEDTIME 30 tablet 0  . fluticasone (FLONASE) 50 MCG/ACT nasal spray Place 2 sprays into both nostrils daily. 18.2 mL 2  . ibuprofen (ADVIL,MOTRIN) 800 MG tablet     . meloxicam (MOBIC) 15 MG tablet TAKE 1 TABLET BY MOUTH ONCE DAILY AS NEEDED FOR PAIN 30 tablet 0  . polyethylene glycol (MIRALAX) 17 g packet Take 17 g by mouth 2 (two) times daily. 20 each 0  . senna (SENOKOT) 8.6 MG tablet Take 1 tablet (8.6 mg total) by mouth daily. 30 tablet 0   No current facility-administered medications on file prior to visit.   ROS:   Negative except as mentioned in HPI and assessment and plan section.   Assessment / Plan / Recommendations:   Please see A&P under problem oriented charting for assessment of the patient's acute and chronic medical conditions.   As always, pt is advised that if symptoms worsen or new symptoms arise, they should go to  an urgent care facility or to to ER for further evaluation.   Consent and Medical Decision Making:   Patient discussed with Dr. Criselda Peaches  This is a telephone encounter between Samuel Chen and Samuel Chen on 07/04/2020 for runny nose and headache. The visit was conducted with the patient located at home and [facility] at [facility]. The patient's identity was confirmed using their DOB and current address. The patient has consented to being evaluated through a telephone encounter and understands the associated risks (an examination cannot be done and the patient may need to come in for an appointment) / benefits (allows the patient to remain at home, decreasing exposure to coronavirus). I personally spent 10 minutes on medical discussion.

## 2020-07-04 NOTE — Assessment & Plan Note (Signed)
(  Televisit) Send refill for atenolol and losartan per patient's request.  Last visit and BMP was a year ago. Instructed to get an appointment to follow-up with PCP.

## 2020-07-05 ENCOUNTER — Other Ambulatory Visit: Payer: Self-pay | Admitting: *Deleted

## 2020-07-05 MED ORDER — FLUTICASONE PROPIONATE 50 MCG/ACT NA SUSP
2.0000 | Freq: Every day | NASAL | 2 refills | Status: DC
Start: 2020-07-05 — End: 2020-08-14

## 2020-07-05 MED ORDER — CETIRIZINE HCL 10 MG PO TABS
10.0000 mg | ORAL_TABLET | Freq: Every day | ORAL | 1 refills | Status: DC
Start: 2020-07-05 — End: 2020-08-14

## 2020-07-09 NOTE — Progress Notes (Signed)
Internal Medicine Clinic Attending  Case discussed with Dr. Masoudi  At the time of the visit.  We reviewed the resident's history and pertinent patient test results.  I agree with the assessment, diagnosis, and plan of care documented in the resident's note.  

## 2020-08-14 ENCOUNTER — Encounter: Payer: Self-pay | Admitting: Student

## 2020-08-14 ENCOUNTER — Ambulatory Visit (INDEPENDENT_AMBULATORY_CARE_PROVIDER_SITE_OTHER): Payer: 59 | Admitting: Student

## 2020-08-14 VITALS — BP 117/61 | HR 89 | Temp 98.3°F | Ht 64.0 in | Wt 170.1 lb

## 2020-08-14 DIAGNOSIS — E785 Hyperlipidemia, unspecified: Secondary | ICD-10-CM

## 2020-08-14 DIAGNOSIS — R7303 Prediabetes: Secondary | ICD-10-CM

## 2020-08-14 DIAGNOSIS — I10 Essential (primary) hypertension: Secondary | ICD-10-CM

## 2020-08-14 DIAGNOSIS — J449 Chronic obstructive pulmonary disease, unspecified: Secondary | ICD-10-CM | POA: Diagnosis not present

## 2020-08-14 DIAGNOSIS — M7062 Trochanteric bursitis, left hip: Secondary | ICD-10-CM

## 2020-08-14 DIAGNOSIS — L301 Dyshidrosis [pompholyx]: Secondary | ICD-10-CM

## 2020-08-14 DIAGNOSIS — Z0000 Encounter for general adult medical examination without abnormal findings: Secondary | ICD-10-CM

## 2020-08-14 DIAGNOSIS — J310 Chronic rhinitis: Secondary | ICD-10-CM

## 2020-08-14 DIAGNOSIS — J329 Chronic sinusitis, unspecified: Secondary | ICD-10-CM

## 2020-08-14 LAB — POCT GLYCOSYLATED HEMOGLOBIN (HGB A1C): Hemoglobin A1C: 6.2 % — AB (ref 4.0–5.6)

## 2020-08-14 LAB — GLUCOSE, CAPILLARY: Glucose-Capillary: 128 mg/dL — ABNORMAL HIGH (ref 70–99)

## 2020-08-14 MED ORDER — CLOBETASOL PROPIONATE 0.05 % EX CREA: TOPICAL_CREAM | CUTANEOUS | 1 refills | Status: DC

## 2020-08-14 MED ORDER — ATORVASTATIN CALCIUM 40 MG PO TABS: ORAL_TABLET | ORAL | 5 refills | Status: DC

## 2020-08-14 MED ORDER — FLUTICASONE PROPIONATE 50 MCG/ACT NA SUSP
2.0000 | Freq: Every day | NASAL | 2 refills | Status: DC
Start: 2020-08-14 — End: 2020-11-27

## 2020-08-14 MED ORDER — VALSARTAN 160 MG PO TABS: 160.0000 mg | ORAL_TABLET | Freq: Every day | ORAL | 0 refills | Status: DC

## 2020-08-14 MED ORDER — ATENOLOL 50 MG PO TABS: 50.0000 mg | ORAL_TABLET | Freq: Every day | ORAL | 3 refills | Status: DC

## 2020-08-14 MED ORDER — SYMBICORT 160-4.5 MCG/ACT IN AERO: 2.0000 | INHALATION_SPRAY | Freq: Two times a day (BID) | RESPIRATORY_TRACT | 1 refills | Status: DC

## 2020-08-14 MED ORDER — ALBUTEROL SULFATE HFA 108 (90 BASE) MCG/ACT IN AERS: 2.0000 | INHALATION_SPRAY | Freq: Four times a day (QID) | RESPIRATORY_TRACT | 3 refills | Status: DC | PRN

## 2020-08-14 MED ORDER — CHOLECALCIFEROL 25 MCG (1000 UT) PO TABS: 1000.0000 [IU] | ORAL_TABLET | Freq: Every day | ORAL | 2 refills | Status: DC

## 2020-08-14 MED ORDER — CETIRIZINE HCL 10 MG PO TABS: 10.0000 mg | ORAL_TABLET | Freq: Every day | ORAL | 1 refills | Status: DC

## 2020-08-14 MED ORDER — METFORMIN HCL 500 MG PO TABS: 500.0000 mg | ORAL_TABLET | Freq: Every day | ORAL | 0 refills | Status: DC

## 2020-08-14 NOTE — Assessment & Plan Note (Signed)
Patient states he takes Symbicort every day as prescribed. He only has to use the albuterol 1-2 times per week. States he has chronic cough, but it is currently manageable.  - Continue Symbicort daily - Continue albuterol PRN

## 2020-08-14 NOTE — Assessment & Plan Note (Signed)
Patient reports he has had some allergy issues over the last few months. Mentions he had a sinus infection, which has now improved. States the Zyrtec and Flonase he was previously prescribed worked well for him. Will continue with these medications. Denies fevers, rhinorrhea, sinus pressure, dyspnea, sore throat. - Flonase - Zyrtec 10mg  daily

## 2020-08-14 NOTE — Assessment & Plan Note (Signed)
BP Readings from Last 3 Encounters:  08/14/20 117/61  12/08/19 133/79  05/06/19 136/73   Blood pressure at goal today. Denies headache, blurry vision, chest pain, palpitations, dyspnea, leg swelling. Will continue with his current regimen. - Atenolol 50mg  daily - Valsartan 160mg  daily

## 2020-08-14 NOTE — Assessment & Plan Note (Signed)
A1c today 6.2 (6.3 a year ago). Discussed with patient lifestyle modifications vs starting medications. Patient prefers to start medication now. Mentions it has been increasingly hard to eat healthy and exercise since the beginning of the pandemic. Plan to start low-dose metformin and re-assess in 3 months. - Start metformin 500mg  daily - Re-check A1c in 3 months

## 2020-08-14 NOTE — Progress Notes (Signed)
   CC: yearly check-up  HPI:  [name] is a 64 y.o. with hypertension, hyperlipidemia, COPD, alcohol use presenting to clinic today for routine yearly appointment.  Please see problem-based list for further details.  Past Medical History:  Diagnosis Date  . 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 Allergic rhinitis   . COPD (chronic obstructive pulmonary disease) (HCC)    Questionable diagnosis.  . Dyshidrotic eczema   . GERD (gastroesophageal reflux disease)   . Hyperlipidemia   . Hypertension   . Myalgia 11/2007   Likely 2/2 Pravachol  . Tobacco abuse    Review of Systems:  As per HPI  Physical Exam:  Vitals:   08/14/20 1406  BP: 117/61  Pulse: 89  Temp: 98.3 F (36.8 C)  TempSrc: Oral  SpO2: 100%  Weight: 170 lb 1.6 oz (77.2 kg)  Height: 5\' 4"  (1.626 m)   General: Sitting in chair, no acute distress CV: Regular rate, rhythm. No murmurs, rubs, gallops Pulm: Clear to auscultation bilaterally. No wheezing, rales, rhonchi. GI: Abdomen soft, non-tender  Assessment & Plan:   See Encounters Tab for problem based charting.  Patient seen with Dr. 

## 2020-08-14 NOTE — Assessment & Plan Note (Signed)
Patient states he received the influenza vaccine this year through his pharmacy. He also mentions he has received all three COVID-19 vaccines (booster in October 2021). He states he received tetanus vaccine two years ago after an accident. Referral placed for colonoscopy (last 08/2010). Discussed lung cancer screening w/ CT (40+ pack-year history). Patient is not sure he wants to go through with the study at this time. Will re-assess in three months.

## 2020-08-14 NOTE — Patient Instructions (Addendum)
Mr. Kock,  It was a pleasure seeing you today!  I am glad you are doing well! Your blood pressure looked great during your visit. We will continue the blood pressure medication just like you've been taking.  Your A1c is slightly elevated, similar to last year. This is the "pre-diabetic" range. We would like for you to start a medication called metformin 500mg , once daily.  We have placed a referral for a colonoscopy. They will contact you to schedule.  We would like to see you back in three months. At that point, we will re-check your A1c and discuss the lung cancer screening again.  We look forward to seeing you next time. Please call our clinic at [phone] if you have any questions or concerns. The best time to call is Monday-Friday from 9am-4pm, but there is someone available 24/7 at the same number. If you need medication refills, please notify your pharmacy one week in advance and they will send [phone] a request.  Thank you for letting us take part in your care. Wishing you the best!  Thank you, Dr. [place], MD

## 2020-08-15 LAB — CMP14 + ANION GAP
ALT: 111 IU/L — ABNORMAL HIGH (ref 0–44)
AST: 80 IU/L — ABNORMAL HIGH (ref 0–40)
Albumin/Globulin Ratio: 1.7 (ref 1.2–2.2)
Albumin: 4.7 g/dL (ref 3.8–4.8)
Alkaline Phosphatase: 66 IU/L (ref 44–121)
Anion Gap: 17 mmol/L (ref 10.0–18.0)
BUN/Creatinine Ratio: 16 (ref 10–24)
BUN: 21 mg/dL (ref 8–27)
Bilirubin Total: 0.6 mg/dL (ref 0.0–1.2)
CO2: 19 mmol/L — ABNORMAL LOW (ref 20–29)
Calcium: 8.9 mg/dL (ref 8.6–10.2)
Chloride: 101 mmol/L (ref 96–106)
Creatinine, Ser: 1.31 mg/dL — ABNORMAL HIGH (ref 0.76–1.27)
GFR calc Af Amer: 67 mL/min/{1.73_m2} (ref >59)
GFR calc non Af Amer: 58 mL/min/{1.73_m2} — ABNORMAL LOW (ref >59)
Globulin, Total: 2.8 g/dL (ref 1.5–4.5)
Glucose: 117 mg/dL — ABNORMAL HIGH (ref 65–99)
Potassium: 4.3 mmol/L (ref 3.5–5.2)
Sodium: 137 mmol/L (ref 134–144)
Total Protein: 7.5 g/dL (ref 6.0–8.5)

## 2020-08-15 LAB — CBC
Hematocrit: 42.7 % (ref 37.5–51.0)
Hemoglobin: 14.9 g/dL (ref 13.0–17.7)
MCH: 32.1 pg (ref 26.6–33.0)
MCHC: 34.9 g/dL (ref 31.5–35.7)
MCV: 92 fL (ref 79–97)
Platelets: 160 10*3/uL (ref 150–450)
RBC: 4.64 x10E6/uL (ref 4.14–5.80)
RDW: 12.4 % (ref 11.6–15.4)
WBC: 7.2 10*3/uL (ref 3.4–10.8)

## 2020-08-15 MED ORDER — MELOXICAM 15 MG PO TABS
15.0000 mg | ORAL_TABLET | Freq: Every day | ORAL | 0 refills | Status: DC | PRN
Start: 2020-08-15 — End: 2020-11-15

## 2020-08-15 MED ORDER — CYCLOBENZAPRINE HCL 10 MG PO TABS
10.0000 mg | ORAL_TABLET | Freq: Every day | ORAL | 0 refills | Status: DC
Start: 2020-08-15 — End: 2020-11-27

## 2020-08-15 NOTE — Addendum Note (Signed)
Addended byEvlyn Kanner on: 08/15/2020 02:17 PM   Modules accepted: Orders

## 2020-08-15 NOTE — Progress Notes (Signed)
Internal Medicine Clinic Attending  I saw and evaluated the patient.  I personally confirmed the key portions of the history and exam documented by Dr. Braswell and I reviewed pertinent patient test results.  The assessment, diagnosis, and plan were formulated together and I agree with the documentation in the resident's note.  

## 2020-08-17 ENCOUNTER — Other Ambulatory Visit: Payer: Self-pay | Admitting: Student

## 2020-08-17 DIAGNOSIS — R7401 Elevation of levels of liver transaminase levels: Secondary | ICD-10-CM

## 2020-08-17 DIAGNOSIS — R197 Diarrhea, unspecified: Secondary | ICD-10-CM

## 2020-08-17 NOTE — Addendum Note (Signed)
Addended byEvlyn Kanner on: 08/17/2020 01:33 PM   Modules accepted: Orders

## 2020-08-23 ENCOUNTER — Other Ambulatory Visit: Payer: 59

## 2020-08-23 DIAGNOSIS — R7401 Elevation of levels of liver transaminase levels: Secondary | ICD-10-CM

## 2020-08-23 DIAGNOSIS — R197 Diarrhea, unspecified: Secondary | ICD-10-CM

## 2020-08-24 LAB — HEPATITIS C ANTIBODY: Hep C Virus Ab: <0.1 s/co ratio (ref 0.0–0.9)

## 2020-08-24 LAB — BMP8+ANION GAP
Anion Gap: 15 mmol/L (ref 10.0–18.0)
BUN/Creatinine Ratio: 17 (ref 10–24)
BUN: 19 mg/dL (ref 8–27)
CO2: 23 mmol/L (ref 20–29)
Calcium: 9.1 mg/dL (ref 8.6–10.2)
Chloride: 104 mmol/L (ref 96–106)
Creatinine, Ser: 1.09 mg/dL (ref 0.76–1.27)
GFR calc Af Amer: 84 mL/min/{1.73_m2} (ref >59)
GFR calc non Af Amer: 72 mL/min/{1.73_m2} (ref >59)
Glucose: 97 mg/dL (ref 65–99)
Potassium: 4.3 mmol/L (ref 3.5–5.2)
Sodium: 142 mmol/L (ref 134–144)

## 2020-08-24 LAB — HIV ANTIBODY (ROUTINE TESTING W REFLEX): HIV Screen 4th Generation wRfx: NONREACTIVE

## 2020-08-24 LAB — LIPID PANEL
Chol/HDL Ratio: 3.3 ratio (ref 0.0–5.0)
Cholesterol, Total: 130 mg/dL (ref 100–199)
HDL: 40 mg/dL (ref >39)
LDL Chol Calc (NIH): 59 mg/dL (ref 0–99)
Triglycerides: 190 mg/dL — ABNORMAL HIGH (ref 0–149)
VLDL Cholesterol Cal: 31 mg/dL (ref 5–40)

## 2020-08-24 LAB — HEPATITIS A ANTIBODY, TOTAL: hep A Total Ab: NEGATIVE

## 2020-08-24 LAB — HEPATITIS B SURFACE ANTIBODY,QUALITATIVE: Hep B Surface Ab, Qual: REACTIVE

## 2020-09-12 ENCOUNTER — Ambulatory Visit (HOSPITAL_COMMUNITY): Payer: 59

## 2020-09-26 ENCOUNTER — Ambulatory Visit (HOSPITAL_COMMUNITY): Payer: 59

## 2020-09-29 ENCOUNTER — Ambulatory Visit (HOSPITAL_COMMUNITY)
Admission: RE | Admit: 2020-09-29 | Discharge: 2020-09-29 | Disposition: A | Discharge status: Home / Self Care | Payer: 59 | Source: Ambulatory Visit | Attending: Internal Medicine | Admitting: Internal Medicine

## 2020-09-29 ENCOUNTER — Other Ambulatory Visit: Payer: Self-pay

## 2020-09-29 DIAGNOSIS — R7401 Elevation of levels of liver transaminase levels: Secondary | ICD-10-CM | POA: Insufficient documentation

## 2020-10-18 ENCOUNTER — Encounter: Payer: 59 | Admitting: Gastroenterology

## 2020-10-25 ENCOUNTER — Other Ambulatory Visit: Payer: Self-pay | Admitting: Internal Medicine

## 2020-10-25 DIAGNOSIS — I10 Essential (primary) hypertension: Secondary | ICD-10-CM

## 2020-11-08 ENCOUNTER — Other Ambulatory Visit: Payer: Self-pay | Admitting: Student

## 2020-11-08 DIAGNOSIS — R7303 Prediabetes: Secondary | ICD-10-CM

## 2020-11-10 ENCOUNTER — Encounter: Payer: Self-pay | Admitting: Gastroenterology

## 2020-11-14 ENCOUNTER — Other Ambulatory Visit: Payer: Self-pay | Admitting: Student

## 2020-11-14 ENCOUNTER — Encounter: Payer: 59 | Admitting: Gastroenterology

## 2020-11-14 DIAGNOSIS — M7062 Trochanteric bursitis, left hip: Secondary | ICD-10-CM

## 2020-11-25 ENCOUNTER — Other Ambulatory Visit: Payer: Self-pay | Admitting: Internal Medicine

## 2020-11-25 DIAGNOSIS — I10 Essential (primary) hypertension: Secondary | ICD-10-CM

## 2020-11-26 ENCOUNTER — Other Ambulatory Visit: Payer: Self-pay | Admitting: Student

## 2020-11-26 DIAGNOSIS — J449 Chronic obstructive pulmonary disease, unspecified: Secondary | ICD-10-CM

## 2020-11-27 ENCOUNTER — Other Ambulatory Visit: Payer: Self-pay

## 2020-11-27 ENCOUNTER — Ambulatory Visit (INDEPENDENT_AMBULATORY_CARE_PROVIDER_SITE_OTHER): Payer: 59 | Admitting: Student

## 2020-11-27 ENCOUNTER — Encounter: Payer: Self-pay | Admitting: Student

## 2020-11-27 VITALS — BP 156/73 | HR 76 | Temp 98.7°F | Ht 64.0 in | Wt 171.4 lb

## 2020-11-27 DIAGNOSIS — J310 Chronic rhinitis: Secondary | ICD-10-CM

## 2020-11-27 DIAGNOSIS — L301 Dyshidrosis [pompholyx]: Secondary | ICD-10-CM

## 2020-11-27 DIAGNOSIS — J329 Chronic sinusitis, unspecified: Secondary | ICD-10-CM

## 2020-11-27 DIAGNOSIS — Z0000 Encounter for general adult medical examination without abnormal findings: Secondary | ICD-10-CM

## 2020-11-27 DIAGNOSIS — I10 Essential (primary) hypertension: Secondary | ICD-10-CM

## 2020-11-27 DIAGNOSIS — E785 Hyperlipidemia, unspecified: Secondary | ICD-10-CM | POA: Diagnosis not present

## 2020-11-27 DIAGNOSIS — R7303 Prediabetes: Secondary | ICD-10-CM

## 2020-11-27 DIAGNOSIS — Z889 Allergy status to unspecified drugs, medicaments and biological substances status: Secondary | ICD-10-CM

## 2020-11-27 DIAGNOSIS — M7062 Trochanteric bursitis, left hip: Secondary | ICD-10-CM

## 2020-11-27 DIAGNOSIS — Z87891 Personal history of nicotine dependence: Secondary | ICD-10-CM

## 2020-11-27 LAB — POCT GLYCOSYLATED HEMOGLOBIN (HGB A1C): Hemoglobin A1C: 6.3 % — AB (ref 4.0–5.6)

## 2020-11-27 LAB — GLUCOSE, CAPILLARY: Glucose-Capillary: 129 mg/dL — ABNORMAL HIGH (ref 70–99)

## 2020-11-27 MED ORDER — CETIRIZINE HCL 10 MG PO TABS
10.0000 mg | ORAL_TABLET | Freq: Every day | ORAL | 1 refills | Status: DC
Start: 2020-11-27 — End: 2021-01-23

## 2020-11-27 MED ORDER — AMLODIPINE BESYLATE 5 MG PO TABS: 5.0000 mg | ORAL_TABLET | Freq: Every day | ORAL | 11 refills | Status: DC

## 2020-11-27 MED ORDER — CLOBETASOL PROPIONATE 0.05 % EX CREA: TOPICAL_CREAM | CUTANEOUS | 1 refills | Status: DC

## 2020-11-27 MED ORDER — ATORVASTATIN CALCIUM 40 MG PO TABS
ORAL_TABLET | ORAL | 5 refills | Status: DC
Start: 2020-11-27 — End: 2021-07-09

## 2020-11-27 MED ORDER — MELOXICAM 15 MG PO TABS
ORAL_TABLET | ORAL | 0 refills | Status: DC
Start: 2020-11-27 — End: 2021-03-14

## 2020-11-27 MED ORDER — ATENOLOL 50 MG PO TABS
50.0000 mg | ORAL_TABLET | Freq: Every day | ORAL | 3 refills | Status: DC
Start: 2020-11-27 — End: 2021-07-09

## 2020-11-27 MED ORDER — CYCLOBENZAPRINE HCL 10 MG PO TABS: 10.0000 mg | ORAL_TABLET | Freq: Every day | ORAL | 0 refills | Status: DC

## 2020-11-27 MED ORDER — METFORMIN HCL 500 MG PO TABS: 500.0000 mg | ORAL_TABLET | Freq: Every day | ORAL | 0 refills | Status: DC

## 2020-11-27 MED ORDER — FLUTICASONE PROPIONATE 50 MCG/ACT NA SUSP: 2.0000 | Freq: Every day | NASAL | 2 refills | Status: DC

## 2020-11-27 NOTE — Patient Instructions (Addendum)
Samuel Chen,  It was a pleasure seeing you today!  Today we discussed your blood pressure, prediabetes, allergies, and CT scan for lung cancer screening.  I would like for you to keep taking your blood pressure medication and add another agent called amlodipine. Please make sure to take these daily.  We will continue with metformin 500mg  daily for your pre-diabetes. Please make sure to watch your sugar intake, include liquids.  You will be called to schedule your CT scan for lung cancer.  In addition, continue taking Zyrtec and Flonase for allergies. Some allergy medications (Claritin-D, Allegra-D, etc) have medications that can increase blood pressure. Please make sure to avoid these.  We look forward to seeing you next time. Please call our clinic at [phone] if you have any questions or concerns. The best time to call is Monday-Friday from 9am-4pm, but there is someone available 24/7 at the same number. If you need medication refills, please notify your pharmacy one week in advance and they will send [phone] a request.  Thank you for letting us take part in your care. Wishing you the best!  Thank you, Dr. [place], MD

## 2020-11-27 NOTE — Progress Notes (Signed)
   CC: diabetes, blood pressure follow-up  HPI:  [name] is a 65 y.o. with medical history as listed below presenting to [facility] for regular follow-up for diabetes, blood pressure.  Please see problem-based list for further details, assessments, and plans.  Past Medical History:  Diagnosis Date  . 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 Allergic rhinitis   . COPD (chronic obstructive pulmonary disease) (HCC)    Questionable diagnosis.  . Dyshidrotic eczema   . GERD (gastroesophageal reflux disease)   . Hyperlipidemia   . Hypertension   . Myalgia 11/2007   Likely 2/2 Pravachol  . Tobacco abuse    Review of Systems:  As per HPI  Physical Exam:  Vitals:   11/27/20 1401  BP: (!) 156/73  Pulse: 76  Temp: 98.7 F (37.1 C)  TempSrc: Oral  SpO2: 97%  Weight: 171 lb 6.4 oz (77.7 kg)  Height: 5\' 4"  (1.626 m)   General: Sitting comfortably in chair, no acute distress CV: Regular rate, rhythm. No m/r/g appreciated. Pulm: Normal work of breathing. Clear to auscultation bilaterally. MSK: Normal bulk, tone. No pitting edema bilaterally.  Assessment & Plan:   See Encounters Tab for problem based charting.  Patient discussed with Dr. 

## 2020-11-28 DIAGNOSIS — Z889 Allergy status to unspecified drugs, medicaments and biological substances status: Secondary | ICD-10-CM | POA: Insufficient documentation

## 2020-11-28 NOTE — Assessment & Plan Note (Signed)
Discussed with patient lung cancer screening. Patient initially hesitant during conversation in December, but has decided to pursue testing. Will order low-dose CT chest for screening.

## 2020-11-28 NOTE — Assessment & Plan Note (Signed)
BP Readings from Last 3 Encounters:  11/27/20 (!) 156/73  08/14/20 117/61  12/08/19 133/79   Patient reports compliance with medications, including this morning. Repeat in in office unchanged after sitting for >75min. Denies chest pain, palpitations, dyspnea, HA, blurry vision. Per chart review, previously trialed on diuretics and CCB but did not tolerate well due to side effects. Currently at max dosing for current medications. Discussed with patient re-trial of amlodipine (previously stopped d/t BLE edema). Explained possible side effects of medication, patient verbalized understanding. Will try amlodipine 5mg  and have patient return to clinic in one month. Encouraged taking BP while at home and low salt diet as well. - Continue atenolol 50mg , valsartan 160mg  - Start amlodipine 5mg  qd - Low salt diet - Return to clinic in 1 month

## 2020-11-28 NOTE — Assessment & Plan Note (Addendum)
A1c 6.3 today (previously 6.2 in December). Reports compliance with metformin daily. Denies nausea, vomiting, diarrhea. Discussed diet, mentions he has daily iced tea and Pepsi at work. Encouraged decreasing sugary drinks and food slowly over time. Pending next A1c could benefit from nutritionist referral.  - Continue 500mg  qd - A1c in 27mo

## 2020-11-28 NOTE — Progress Notes (Signed)
Internal Medicine Clinic Attending ? ?Case discussed with Dr. Braswell  At the time of the visit.  We reviewed the resident?s history and exam and pertinent patient test results.  I agree with the assessment, diagnosis, and plan of care documented in the resident?s note.  ?

## 2020-11-28 NOTE — Assessment & Plan Note (Addendum)
Patient reports chronic allergies, previously trialed on Claritin. States over the last 1.5 weeks he has experienced increase in rhinorrhea, mild sore throat, mild cough with clear mucous. Mentions that he has been compliant with daily Zyrtec and takes Flonase regularly. Denies fevers, chills, myalgias, sick contacts.  A/P: Will plan to continue daily Zyrtec and Flonase. Discussed possibly returning to allergist if symptoms do not improve. Previously was seen back in 2018. Discouraged use of over the counter allergy medications that contain decongestants due to elevated blood pressure. - Continue Zyrtec, Flonase - Re-assess at next visit need for allergist

## 2020-12-14 NOTE — Addendum Note (Signed)
Addended by: Dorie Rank E on: 12/14/2020 11:14 AM   Modules accepted: Orders

## 2020-12-28 ENCOUNTER — Other Ambulatory Visit: Payer: Self-pay | Admitting: Student

## 2020-12-28 ENCOUNTER — Ambulatory Visit (INDEPENDENT_AMBULATORY_CARE_PROVIDER_SITE_OTHER): Payer: 59 | Admitting: Internal Medicine

## 2020-12-28 ENCOUNTER — Other Ambulatory Visit: Payer: Self-pay

## 2020-12-28 ENCOUNTER — Encounter: Payer: Self-pay | Admitting: Internal Medicine

## 2020-12-28 DIAGNOSIS — I10 Essential (primary) hypertension: Secondary | ICD-10-CM

## 2020-12-28 DIAGNOSIS — Z0000 Encounter for general adult medical examination without abnormal findings: Secondary | ICD-10-CM | POA: Diagnosis not present

## 2020-12-28 NOTE — Assessment & Plan Note (Signed)
Mentions confusion regarding previously regarded CT chest for lung cancer screening. States has not had any update on scheduling. Reviewed upcoming appt with radiology on 01/11/21. Provided contact information and directions.

## 2020-12-28 NOTE — Progress Notes (Signed)
   CC: Blood pressure  HPI: [name] is a 65 y.o. with PMH listed below presenting with complaint of HTN follow up. Please see problem based assessment and plan for further details.  Past Medical History:  Diagnosis Date  . 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 Allergic rhinitis   . COPD (chronic obstructive pulmonary disease) (HCC)    Questionable diagnosis.  . Dyshidrotic eczema   . GERD (gastroesophageal reflux disease)   . Hyperlipidemia   . Hypertension   . Myalgia 11/2007   Likely 2/2 Pravachol  . Tobacco abuse     Review of Systems: Review of Systems  Constitutional: Negative for chills, fever and malaise/fatigue.  Eyes: Negative for blurred vision.  Respiratory: Negative for shortness of breath.   Cardiovascular: Negative for chest pain, palpitations and leg swelling.  Gastrointestinal: Negative for constipation, diarrhea, nausea and vomiting.  Neurological: Negative for dizziness and headaches.  All other systems reviewed and are negative.    Physical Exam: Vitals:   12/28/20 0918 12/28/20 0946  BP: (!) 143/72 128/76  Pulse: 65   Temp: 97.6 F (36.4 C)   TempSrc: Oral   SpO2: 100%   Weight: 164 lb 12.8 oz (74.8 kg)   Height: 5\' 4"  (1.626 m)    Gen: Well-developed, well nourished, NAD HEENT: NCAT head, hearing intact CV: RRR, S1, S2 normal Pulm: CTAB, No rales, no wheezes Extm: ROM intact, Peripheral pulses intact, No peripheral edema Skin: Dry, Warm, normal turgor  Assessment & Plan:   Essential hypertension BP Readings from Last 3 Encounters:  12/28/20 128/76  11/27/20 (!) 156/73  08/14/20 117/61   [name] is a 65 yo M w/ PMH of HTN, COPD presenting for follow up management of his hypertension. He mentions picking up the amlodipine and taking it as prescribed in addition to his prior regimen of atenolol and valsartan. He denies any significant side effects. Denies any lower extremity edema, light-headedness, dizziness,  palpitations, dyspnea. Discussed importance of DASH and tobacco cessation.  A/P Improved with amlodipine. Prior allergy list likely non-clinically significant. C/w current regimen. - Amlodipine removed from allergy list - C/w amlodipine 5mg , atenolol 50mg , valsartan 160mg   Health care maintenance Mentions confusion regarding previously regarded CT chest for lung cancer screening. States has not had any update on scheduling. Reviewed upcoming appt with radiology on 01/11/21. Provided contact information and directions.    Patient discussed with Dr.  - , PGY3 [facility] Internal Medicine Pager: [phone]

## 2020-12-28 NOTE — Patient Instructions (Signed)
Thank you for allowing [place] to provide your care today. Today we discussed your blood pressure    I have ordered no labs for you. I will call if any are abnormal.    Today we made no changes to your medications.    Please follow-up in 6 months.    Should you have any questions or concerns please call the internal medicine clinic at [phone].     Cooking With Less Salt Cooking with less salt is one way to reduce the amount of sodium you get from food. Sodium is one of the elements that make up salt. It is found naturally in foods and is also added to certain foods. Depending on your condition and overall health, your health care provider or dietitian may recommend that you reduce your sodium intake. Most people should have less than 2,300 milligrams (mg) of sodium each day. If you have high blood pressure (hypertension), you may need to limit your sodium to 1,500 mg each day. Follow the tips below to help reduce your sodium intake. What are tips for eating less sodium? Reading food labels  Check the food label before buying or using packaged ingredients. Always check the label for the serving size and sodium content.  Look for products with no more than 140 mg of sodium in one serving.  Check the % Daily Value column to see what percent of the daily recommended amount of sodium is provided in one serving of the product. Foods with 5% or less in this column are considered low in sodium. Foods with 20% or higher are considered high in sodium.  Do not choose foods with salt as one of the first three ingredients on the ingredients list. If salt is one of the first three ingredients, it usually means the item is high in sodium.   Shopping  Buy sodium-free or low-sodium products. Look for the following words on food labels: ? Low-sodium. ? Sodium-free. ? Reduced-sodium. ? No salt added. ? Unsalted.  Always check the sodium content even if foods are labeled as low-sodium or no salt  added.  Buy fresh foods. Cooking  Use herbs, seasonings without salt, and spices as substitutes for salt.  Use sodium-free baking soda when baking.  Grill, braise, or roast foods to add flavor with less salt.  Avoid adding salt to pasta, rice, or hot cereals.  Drain and rinse canned vegetables, beans, and meat before use.  Avoid adding salt when cooking sweets and desserts.  Cook with low-sodium ingredients. What foods are high in sodium? Vegetables Regular canned vegetables (not low-sodium or reduced-sodium). Sauerkraut, pickled vegetables, and relishes. Olives. [place] fries. Onion rings. Regular canned tomato sauce and paste. Regular tomato and vegetable juice. Frozen vegetables in sauces. Grains Instant hot cereals. Bread stuffing, pancake, and biscuit mixes. Croutons. Seasoned rice or pasta mixes. Noodle soup cups. Boxed or frozen macaroni and cheese. Regular salted crackers. Self-rising flour. Rolls. Bagels. Flour tortillas and wraps. Meats and other proteins Meat or fish that is salted, canned, smoked, cured, spiced, or pickled. This includes bacon, ham, sausages, hot dogs, corned beef, chipped beef, meat loaves, salt pork, jerky, pickled herring, anchovies, regular canned tuna, and sardines. Salted nuts. Dairy Processed cheese and cheese spreads. Cheese curds. Blue cheese. Feta cheese. String cheese. Regular cottage cheese. Buttermilk. Canned milk. The items listed above may not be a complete list of foods high in sodium. Actual amounts of sodium may be different depending on processing. Contact a dietitian for more information. What  foods are low in sodium? Fruits Fresh, frozen, or canned fruit with no sauce added. Fruit juice. Vegetables Fresh or frozen vegetables with no sauce added. "No salt added" canned vegetables. "No salt added" tomato sauce and paste. Low-sodium or reduced-sodium tomato and vegetable juice. Grains Noodles, pasta, quinoa, rice. Shredded or puffed  wheat or puffed rice. Regular or quick oats (not instant). Low-sodium crackers. Low-sodium bread. Whole-grain bread and whole-grain pasta. Unsalted popcorn. Meats and other proteins Fresh or frozen whole meats, poultry (not injected with sodium), and fish with no sauce added. Unsalted nuts. Dried peas, beans, and lentils without added salt. Unsalted canned beans. Eggs. Unsalted nut butters. Low-sodium canned tuna or chicken. Dairy Milk. Soy milk. Yogurt. Low-sodium cheeses, such as Swiss, [address], [place], and [employer]. Sherbet or ice cream (keep to  cup per serving). Cream cheese. Fats and oils Unsalted butter or margarine. Other foods Homemade pudding. Sodium-free baking soda and baking powder. Herbs and spices. Low-sodium seasoning mixes. Beverages Coffee and tea. Carbonated beverages. The items listed above may not be a complete list of foods low in sodium. Actual amounts of sodium may be different depending on processing. Contact a dietitian for more information. What are some salt alternatives when cooking? The following are herbs, seasonings, and spices that can be used instead of salt to flavor your food. Herbs should be fresh or dried. Do not choose packaged mixes. Next to the name of the herb, spice, or seasoning are some examples of foods you can pair it with. Herbs  Bay leaves - Soups, meat and vegetable dishes, and spaghetti sauce.  Basil - [employer], soups, pasta, and fish dishes.  Cilantro - Meat, poultry, and vegetable dishes.  Chili powder - Marinades and Mexican dishes.  Chives - Salad dressings and potato dishes.  Cumin - Mexican dishes, couscous, and meat dishes.  Dill - Fish dishes, sauces, and salads.  Fennel - Meat and vegetable dishes, breads, and cookies.  Garlic (do not use garlic salt) - [place] dishes, meat dishes, salad dressings, and sauces.  Marjoram - Soups, potato dishes, and meat dishes.  Oregano - Pizza and spaghetti  sauce.  Parsley - Salads, soups, pasta, and meat dishes.  Rosemary - [place] dishes, salad dressings, soups, and red meats.  Saffron - Fish dishes, pasta, and some poultry dishes.  Sage - Stuffings and sauces.  Tarragon - Fish and [employer].  Thyme - Stuffing, meat, and fish dishes. Seasonings  Lemon juice - Fish dishes, poultry dishes, vegetables, and salads.  Vinegar - Salad dressings, vegetables, and fish dishes. Spices  Cinnamon - Sweet dishes, such as cakes, cookies, and puddings.  Cloves - Gingerbread, puddings, and marinades for meats.  Curry - Vegetable dishes, fish and poultry dishes, and stir-fry dishes.  Ginger - Vegetable dishes, fish dishes, and stir-fry dishes.  Nutmeg - Pasta, vegetables, poultry, fish dishes, and custard. Summary  Cooking with less salt is one way to reduce the amount of sodium that you get from food.  Buy sodium-free or low-sodium products.  Check the food label before using or buying packaged ingredients.  Use herbs, seasonings without salt, and spices as substitutes for salt in foods. This information is not intended to replace advice given to you by your health care provider. Make sure you discuss any questions you have with your health care provider. Document Revised: 08/11/2019 Document Reviewed: 08/11/2019 Elsevier Patient Education  2021 [employer].

## 2020-12-28 NOTE — Assessment & Plan Note (Signed)
BP Readings from Last 3 Encounters:  12/28/20 128/76  11/27/20 (!) 156/73  08/14/20 117/61   Samuel Chen is a 65 yo M w/ PMH of HTN, COPD presenting for follow up management of his hypertension. He mentions picking up the amlodipine and taking it as prescribed in addition to his prior regimen of atenolol and valsartan. He denies any significant side effects. Denies any lower extremity edema, light-headedness, dizziness, palpitations, dyspnea. Discussed importance of DASH and tobacco cessation.  A/P Improved with amlodipine. Prior allergy list likely non-clinically significant. C/w current regimen. - Amlodipine removed from allergy list - C/w amlodipine 5mg , atenolol 50mg , valsartan 160mg 

## 2020-12-29 NOTE — Progress Notes (Signed)
Internal Medicine Clinic Attending  Case discussed with Dr. Lee  At the time of the visit.  We reviewed the resident's history and exam and pertinent patient test results.  I agree with the assessment, diagnosis, and plan of care documented in the resident's note.    

## 2021-01-02 ENCOUNTER — Other Ambulatory Visit: Payer: Self-pay | Admitting: Student

## 2021-01-02 DIAGNOSIS — J449 Chronic obstructive pulmonary disease, unspecified: Secondary | ICD-10-CM

## 2021-01-08 ENCOUNTER — Other Ambulatory Visit: Payer: Self-pay | Admitting: Student

## 2021-01-08 DIAGNOSIS — R7303 Prediabetes: Secondary | ICD-10-CM

## 2021-01-10 ENCOUNTER — Other Ambulatory Visit: Payer: Self-pay | Admitting: Student

## 2021-01-10 DIAGNOSIS — R7303 Prediabetes: Secondary | ICD-10-CM

## 2021-01-11 ENCOUNTER — Ambulatory Visit (HOSPITAL_COMMUNITY): Payer: 59

## 2021-01-19 ENCOUNTER — Ambulatory Visit (HOSPITAL_COMMUNITY)
Admission: RE | Admit: 2021-01-19 | Discharge: 2021-01-19 | Disposition: A | Discharge status: Home / Self Care | Payer: 59 | Source: Ambulatory Visit | Attending: Internal Medicine | Admitting: Internal Medicine

## 2021-01-19 ENCOUNTER — Other Ambulatory Visit: Payer: Self-pay

## 2021-01-19 DIAGNOSIS — Z87891 Personal history of nicotine dependence: Secondary | ICD-10-CM | POA: Diagnosis not present

## 2021-01-22 ENCOUNTER — Other Ambulatory Visit: Payer: Self-pay | Admitting: Student

## 2021-01-22 DIAGNOSIS — J310 Chronic rhinitis: Secondary | ICD-10-CM

## 2021-01-22 DIAGNOSIS — J329 Chronic sinusitis, unspecified: Secondary | ICD-10-CM

## 2021-02-10 ENCOUNTER — Other Ambulatory Visit: Payer: Self-pay | Admitting: Internal Medicine

## 2021-02-10 DIAGNOSIS — R7303 Prediabetes: Secondary | ICD-10-CM

## 2021-02-12 ENCOUNTER — Other Ambulatory Visit: Payer: Self-pay | Admitting: Internal Medicine

## 2021-02-12 DIAGNOSIS — R7303 Prediabetes: Secondary | ICD-10-CM

## 2021-03-08 ENCOUNTER — Other Ambulatory Visit: Payer: Self-pay | Admitting: Student

## 2021-03-08 DIAGNOSIS — R7303 Prediabetes: Secondary | ICD-10-CM

## 2021-03-13 ENCOUNTER — Other Ambulatory Visit: Payer: Self-pay | Admitting: Student

## 2021-03-13 DIAGNOSIS — M7062 Trochanteric bursitis, left hip: Secondary | ICD-10-CM

## 2021-03-27 ENCOUNTER — Other Ambulatory Visit: Payer: Self-pay

## 2021-03-27 ENCOUNTER — Ambulatory Visit: Payer: 59 | Admitting: Pulmonary Disease

## 2021-03-27 ENCOUNTER — Encounter: Payer: Self-pay | Admitting: Pulmonary Disease

## 2021-03-27 VITALS — BP 140/66 | HR 69 | Ht 64.0 in | Wt 169.5 lb

## 2021-03-27 DIAGNOSIS — J309 Allergic rhinitis, unspecified: Secondary | ICD-10-CM

## 2021-03-27 DIAGNOSIS — J4530 Mild persistent asthma, uncomplicated: Secondary | ICD-10-CM | POA: Diagnosis not present

## 2021-03-27 DIAGNOSIS — J438 Other emphysema: Secondary | ICD-10-CM

## 2021-03-27 MED ORDER — BREZTRI AEROSPHERE 160-9-4.8 MCG/ACT IN AERO: 2.0000 | INHALATION_SPRAY | Freq: Two times a day (BID) | RESPIRATORY_TRACT | 0 refills | Status: DC

## 2021-03-27 MED ORDER — BREZTRI AEROSPHERE 160-9-4.8 MCG/ACT IN AERO: 2.0000 | INHALATION_SPRAY | Freq: Two times a day (BID) | RESPIRATORY_TRACT | 11 refills | Status: DC

## 2021-03-27 NOTE — Progress Notes (Signed)
@Patient  ID: , male    DOB: 11-19-1955, 65 y.o.   MRN: [id number]  Chief Complaint  Patient presents with   Consult    Sometime harder to breath. Mostly happens when moving around doing things. Relaxing makes it better. Inhaler seems to help.    Referring provider: [id number], MD  HPI:   65 year old man whom we are seeing for evaluation of dyspnea on exertion, emphysema, asthma.  PCP note reviewed.  Most recent pulmonary note 2015 reviewed.  Former patient of Dr. 68.  Patient has longstanding history of dyspnea on exertion.  Largely not limited in day-to-day activities by dyspnea.  Has noticed over the last several months mild worsening of dyspnea on exertion.  Worse on inclines or stairs.  Worse when hot.  No time of day when things are better or worse.  No position where things are better or worse.  Other than that he, no seasonal or environmental changes that make things better or worse.  Symptoms have been well controlled on Advair in the past.  Switched to Symbicort by PCP in the interim since last pulmonary appointment.  Most recent chest imaging 12/2020 low-dose CT lung cancer screening scan reviewed interpreted as clear lungs, mild emphysematous changes with predilection for the apices/upper lobes compared to the rest of the lungs, small left upper lobe nodule.  Report is lung RADS 2 regular 90-month follow-up.  PMH: Hypertension, hyperlipidemia, tobacco abuse in remission, asthma Surgical history: Reviewed, denies any. Family history: He denies any respiratory illnesses in first-degree relatives on review Social history: Former cigarette smoker, 60-pack-year history, continues to vape nicotine without flavor additives per his report, works for the city of [employer] / Pulmonary Flowsheets:   ACT:  Asthma Control Test ACT Total Score  09/01/2017 12  07/01/2017 12    MMRC: mMRC Dyspnea Scale mMRC Score  03/27/2021 3    Epworth:   No flowsheet data found.  Tests:   FENO:  No results found for: NITRICOXIDE  PFT: No flowsheet data found.  WALK:  SIX MIN WALK 04/27/2013  Supplimental Oxygen during Test? (L/min) No  Tech Comments: Lowest o2 sat during walk 94% RA.      Imaging: Personally reviewed and as per EMR  Lab Results: Personally reviewed, no significant elevation of eosinophils in the past CBC    Component Value Date/Time   WBC 7.2 08/14/2020 1444   WBC 7.1 08/16/2015 1523   RBC 4.64 08/14/2020 1444   RBC 3.89 (L) 08/16/2015 1545   RBC 3.85 (L) 08/16/2015 1523   HGB 14.9 08/14/2020 1444   HCT 42.7 08/14/2020 1444   PLT 160 08/14/2020 1444   MCV 92 08/14/2020 1444   MCH 32.1 08/14/2020 1444   MCH 31.2 08/16/2015 1523   MCHC 34.9 08/14/2020 1444   MCHC 33.6 08/16/2015 1523   RDW 12.4 08/14/2020 1444   LYMPHSABS 2.6 08/16/2015 1545   MONOABS 0.8 08/16/2015 1545   EOSABS 0.1 08/16/2015 1545   BASOSABS 0.0 08/16/2015 1545    BMET    Component Value Date/Time   NA 142 08/23/2020 1340   K 4.3 08/23/2020 1340   CL 104 08/23/2020 1340   CO2 23 08/23/2020 1340   GLUCOSE 97 08/23/2020 1340   GLUCOSE 102 (H) 08/16/2015 1523   BUN 19 08/23/2020 1340   CREATININE 1.09 08/23/2020 1340   CREATININE 0.97 04/14/2014 1334   CALCIUM 9.1 08/23/2020 1340   GFRNONAA 72 08/23/2020 1340   GFRNONAA 88 04/14/2014  1334   GFRAA 84 08/23/2020 1340   GFRAA >89 04/14/2014 1334    BNP No results found for: BNP  ProBNP No results found for: PROBNP  Specialty Problems       Pulmonary Problems   Allergic rhinitis    Qualifier: Diagnosis of  By: Darl Pikes, Beth         COPD (chronic obstructive pulmonary disease)Gold B    COPD with asthmatic bronchitis smoking induced.  Golds stage Dorian Pod: 8/ 26:  FeV1 72% FeV1/FVC 68%  Chantix failure 2012-2013        Viral upper respiratory tract infection   Rhinosinusitis    Allergies  Allergen Reactions   Acetaminophen     REACTION:  (causes liver problems)   Aspirin     REACTION: bleeding/nose bleeds   Augmentin [Amoxicillin-Pot Clavulanate] Diarrhea    Immunization History  Administered Date(s) Administered   Hepatitis B 10/23/2006, 11/24/2006, 05/12/2007   Influenza Split 06/12/2011, 06/24/2012   Influenza Whole 06/16/2007, 06/22/2010   Influenza, Quadrivalent, Recombinant, Inj, Pf 05/30/2018   Influenza,inj,Quad PF,6+ Mos 07/08/2013, 05/18/2014, 06/02/2018   PFIZER(Purple Top)SARS-COV-2 Vaccination 09/10/2019, 10/04/2019, 06/30/2020   Pneumococcal Polysaccharide-23 03/27/2010   Td 03/27/2010   Tdap 12/29/2018    Past Medical History:  Diagnosis Date   Alcohol abuse    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   Dyshidrotic eczema    GERD (gastroesophageal reflux disease)    Hyperlipidemia    Hypertension    Myalgia 11/2007   Likely 2/2 Pravachol   Tobacco abuse     Tobacco History: Social History   Tobacco Use  Smoking Status Former   Packs/day: 1.50   Years: 40.00   Pack years: 60.00   Types: E-cigarettes, Cigarettes   Start date: 06/03/1983   Quit date: 03/04/2013   Years since quitting: 8.0  Smokeless Tobacco Current  Tobacco Comments   Uses a Vapor.   Ready to quit: Not Answered Counseling given: Not Answered Tobacco comments: Uses a Vapor.   Continue to not smoke  Outpatient Encounter Medications as of 03/27/2021  Medication Sig   albuterol (PROVENTIL HFA) 108 (90 Base) MCG/ACT inhaler Inhale 2 puffs into the lungs every 6 (six) hours as needed for wheezing or shortness of breath.   amLODipine (NORVASC) 5 MG tablet Take 1 tablet (5 mg total) by mouth daily.   atenolol (TENORMIN) 50 MG tablet Take 1 tablet (50 mg total) by mouth daily.   atorvastatin (LIPITOR) 40 MG tablet TAKE 1 TABLET BY MOUTH ONCE DAILY AT  6  PM   Budeson-Glycopyrrol-Formoterol (BREZTRI AEROSPHERE) 160-9-4.8 MCG/ACT AERO  Inhale 2 puffs into the lungs 2 (two) times daily.   Budeson-Glycopyrrol-Formoterol (BREZTRI AEROSPHERE) 160-9-4.8 MCG/ACT AERO Inhale 2 puffs into the lungs in the morning and at bedtime.   Cholecalciferol 25 MCG (1000 UT) tablet Take 1 tablet (1,000 Units total) by mouth daily.   clobetasol cream (TEMOVATE) 0.05 % APPLY 1 APPLICATION TOPICALLY 2 TIMES DAILY. APPLY TO ITCHY AREAS.   cyclobenzaprine (FLEXERIL) 10 MG tablet Take 1 tablet (10 mg total) by mouth at bedtime.   EQ ALLERGY RELIEF, CETIRIZINE, 10 MG tablet Take 1 tablet by mouth once daily   fluticasone (FLONASE) 50 MCG/ACT nasal spray Place 2 sprays into both nostrils daily.   meloxicam (MOBIC) 15 MG tablet TAKE 1 TABLET BY MOUTH ONCE DAILY AS NEEDED FOR PAIN   metFORMIN (GLUCOPHAGE)  500 MG tablet Take 1 tablet by mouth once daily with breakfast   polyethylene glycol (MIRALAX) 17 g packet Take 17 g by mouth 2 (two) times daily.   senna (SENOKOT) 8.6 MG tablet Take 1 tablet (8.6 mg total) by mouth daily.   valsartan (DIOVAN) 160 MG tablet Take 1 tablet by mouth once daily   [DISCONTINUED] SYMBICORT 160-4.5 MCG/ACT inhaler Inhale 2 puffs by mouth twice daily   No facility-administered encounter medications on file as of 03/27/2021.     Review of Systems  Review of Systems  No chest pain with exertion.  No orthopnea or PND.  Comprehensive review of systems otherwise negative. Physical Exam  BP 140/66   Pulse 69   Ht 5\' 4"  (1.626 m)   Wt 169 lb 8 oz (76.9 kg)   SpO2 98%   BMI 29.09 kg/m   Wt Readings from Last 5 Encounters:  03/27/21 169 lb 8 oz (76.9 kg)  12/28/20 164 lb 12.8 oz (74.8 kg)  11/27/20 171 lb 6.4 oz (77.7 kg)  08/14/20 170 lb 1.6 oz (77.2 kg)  12/08/19 165 lb 8 oz (75.1 kg)    BMI Readings from Last 5 Encounters:  03/27/21 29.09 kg/m  12/28/20 28.29 kg/m  11/27/20 29.42 kg/m  08/14/20 29.20 kg/m  12/08/19 28.41 kg/m     Physical Exam General: Sitting in chair, no acute distress Eyes: EOMI,  no icterus Neck: Supple, no JVP Cardiovascular: Regular rate and rhythm, no murmur appreciated Pulmonary: Normal work of breathing, on room air, a bit distant but clear to auscultation bilaterally Abdomen: Nondistended, bowel sounds present MSK: No synovitis, no joint effusion Neuro: Normal gait, no weakness Psych: Normal mood, full affect  Assessment & Plan:   Asthma: Based on history of bronchitic and atopic symptoms.  Previously pretty well controlled on Advair then switch to Symbicort at a later date.  Noticed worsening dyspnea over the last several months.  Possibility of asthma or obstructive physiology.  Escalate Symbicort to 02/07/20.   Emphysema: Abdomen on CT scan.  Related to prior smoking history.  Likely contributing dyspnea on exertion, gastrum, possible COPD although prior PFTs do not support this diagnosis.  Last obtained in 2017 or 2018.  Breztri as above.   Return in about 6 months (around 09/27/2021).   09/29/2021, MD 03/27/2021

## 2021-03-27 NOTE — Patient Instructions (Addendum)
Nice to meet you  Stop Symbicort.  Start Breztri 2 puffs twice a day.  Please rinse your mouth out after every use.  I provided some samples today and sent a new prescription to your local pharmacy.  This adds a third medicine to the Symbicort to try to help improve your breathing, shortness of breath.  Recommend to continue your lung cancer screening program, the scan in May 2022 was reassuring and we recommended a 1 year follow-up.  Return to clinic in 6 months or sooner as needed with Dr. Judeth Horn.

## 2021-04-02 ENCOUNTER — Telehealth: Payer: Self-pay | Admitting: Pulmonary Disease

## 2021-04-02 MED ORDER — AEROCHAMBER MV MISC: 0 refills | Status: DC

## 2021-04-02 MED ORDER — BUDESONIDE-FORMOTEROL FUMARATE 160-4.5 MCG/ACT IN AERO: 2.0000 | INHALATION_SPRAY | Freq: Two times a day (BID) | RESPIRATORY_TRACT | 6 refills | Status: DC

## 2021-04-02 NOTE — Telephone Encounter (Signed)
Bevelyn Ngo, NP  Maurene Capes, CMA; [facility] Triage Pool; Hunsucker, Lesia Sago, MD Just now (11:08 AM)   Fine to resume Symbicort.  Please also give patient a Spacer and teach him how to use it. Cough may be due to medication hitting the back of his throat and triggering a cough.  Thanks so much    Called and spoke with pt letting him know the recs stated by Maralyn Sago and he verbalized understanding. Rx for both Symbicort and Spacer have been sent to preferred pharmacy for pt and also put a comment for pharmacy to show pt how to use the spacer with the Symbicort. Nothing further needed.

## 2021-04-02 NOTE — Telephone Encounter (Signed)
Primary Pulmonologist: MH Last office visit and with whom: 03/27/21 What do we see them for (pulmonary problems): asthma Last OV assessment/plan:  Asthma: Based on history of bronchitic and atopic symptoms.  Previously pretty well controlled on Advair then switch to Symbicort at a later date.  Noticed worsening dyspnea over the last several months.  Possibility of asthma or obstructive physiology.  Escalate Symbicort to [employer].   Emphysema: Abdomen on CT scan.  Related to prior smoking history.  Likely contributing dyspnea on exertion, gastrum, possible COPD although prior PFTs do not support this diagnosis.  Last obtained in 2017 or 2018.  Breztri as above.     Return in about 6 months (around 09/27/2021).  Was appointment offered to patient (explain)?  Patient wanted recommendations since he was in the office on 07/26 for a consult.    Reason for call: Spoke with patient. He stated that he started using the Breztri sample MH provided him on 03/28/21. The coughing increased after the PM dose. The cough has been productive at times with clear phlegm. He denied feeling like his throat was about to close, sore throat or any increased SOB. Also denied any fevers. He went ahead and used the [place] on 03/29/21 and noticed the cough again. He has also noticed that he has been wheezing more at night since using Breztri.   He believes the Markus Daft is too strong for him and he would like to go back to Symbicort 160. Pharmacy is [facility] on [facility].   (examples of things to ask: : When did symptoms start? Fever? Cough? Productive? Color to sputum? More sputum than usual? Wheezing? Have you needed increased oxygen? Are you taking your respiratory medications? What over the counter measures have you tried?)  Allergies  Allergen Reactions   Acetaminophen     REACTION: (causes liver problems)   Aspirin     REACTION: bleeding/nose bleeds   Augmentin [Amoxicillin-Pot Clavulanate] Diarrhea     Immunization History  Administered Date(s) Administered   Hepatitis B 10/23/2006, 11/24/2006, 05/12/2007   Influenza Split 06/12/2011, 06/24/2012   Influenza Whole 06/16/2007, 06/22/2010   Influenza, Quadrivalent, Recombinant, Inj, Pf 05/30/2018   Influenza,inj,Quad PF,6+ Mos 07/08/2013, 05/18/2014, 06/02/2018   PFIZER(Purple Top)SARS-COV-2 Vaccination 09/10/2019, 10/04/2019, 06/30/2020   Pneumococcal Polysaccharide-23 03/27/2010   Td 03/27/2010   Tdap 12/29/2018    Sarah, can you please advise since Dr. Judeth Horn is out of the office? Thanks!

## 2021-04-08 ENCOUNTER — Other Ambulatory Visit: Payer: Self-pay | Admitting: Student

## 2021-04-08 DIAGNOSIS — R7303 Prediabetes: Secondary | ICD-10-CM

## 2021-04-11 NOTE — Telephone Encounter (Signed)
metFORMIN (GLUCOPHAGE) 500 MG tablet, refill request @ [id number] - [place], [place] - [address]

## 2021-04-13 ENCOUNTER — Telehealth: Payer: Self-pay | Admitting: *Deleted

## 2021-04-13 NOTE — Telephone Encounter (Signed)
I have attempted to complete the PA for the [facility] inhaler for the pt .  This is the reading that I get back:   This medication or product is on your plan's list of covered drugs. Prior authorization is not required at this time. If your pharmacy has questions regarding the processing of your prescription, please have them call the [facility] pharmacy help desk at [phone]. **Please note: This request was submitted electronically. Formulary lowering, tiering exception, cost reduction and/or pre-benefit determination review (including prospective Medicare hospice reviews) requests cannot be requested using this method of submission. Providers contact us at [phone] for further assistance.  Will close this out for now.

## 2021-05-08 ENCOUNTER — Other Ambulatory Visit: Payer: Self-pay | Admitting: Student

## 2021-05-08 ENCOUNTER — Other Ambulatory Visit: Payer: Self-pay | Admitting: Internal Medicine

## 2021-05-08 DIAGNOSIS — M7062 Trochanteric bursitis, left hip: Secondary | ICD-10-CM

## 2021-05-08 DIAGNOSIS — R7303 Prediabetes: Secondary | ICD-10-CM

## 2021-06-02 ENCOUNTER — Other Ambulatory Visit: Payer: Self-pay | Admitting: Student

## 2021-06-02 DIAGNOSIS — J310 Chronic rhinitis: Secondary | ICD-10-CM

## 2021-06-30 ENCOUNTER — Other Ambulatory Visit: Payer: Self-pay | Admitting: Student

## 2021-06-30 DIAGNOSIS — M7062 Trochanteric bursitis, left hip: Secondary | ICD-10-CM

## 2021-07-09 ENCOUNTER — Other Ambulatory Visit: Payer: Self-pay

## 2021-07-09 ENCOUNTER — Ambulatory Visit (INDEPENDENT_AMBULATORY_CARE_PROVIDER_SITE_OTHER): Payer: 59 | Admitting: Student

## 2021-07-09 ENCOUNTER — Encounter: Payer: Self-pay | Admitting: Internal Medicine

## 2021-07-09 VITALS — BP 134/73 | HR 72 | Temp 98.6°F | Resp 24 | Ht 64.0 in | Wt 171.6 lb

## 2021-07-09 DIAGNOSIS — R7303 Prediabetes: Secondary | ICD-10-CM

## 2021-07-09 DIAGNOSIS — Z0000 Encounter for general adult medical examination without abnormal findings: Secondary | ICD-10-CM | POA: Diagnosis not present

## 2021-07-09 DIAGNOSIS — J310 Chronic rhinitis: Secondary | ICD-10-CM

## 2021-07-09 DIAGNOSIS — L301 Dyshidrosis [pompholyx]: Secondary | ICD-10-CM

## 2021-07-09 DIAGNOSIS — R252 Cramp and spasm: Secondary | ICD-10-CM | POA: Diagnosis not present

## 2021-07-09 DIAGNOSIS — I10 Essential (primary) hypertension: Secondary | ICD-10-CM

## 2021-07-09 DIAGNOSIS — J329 Chronic sinusitis, unspecified: Secondary | ICD-10-CM

## 2021-07-09 DIAGNOSIS — L918 Other hypertrophic disorders of the skin: Secondary | ICD-10-CM

## 2021-07-09 DIAGNOSIS — J449 Chronic obstructive pulmonary disease, unspecified: Secondary | ICD-10-CM

## 2021-07-09 DIAGNOSIS — E538 Deficiency of other specified B group vitamins: Secondary | ICD-10-CM

## 2021-07-09 DIAGNOSIS — K5900 Constipation, unspecified: Secondary | ICD-10-CM

## 2021-07-09 DIAGNOSIS — M21372 Foot drop, left foot: Secondary | ICD-10-CM | POA: Insufficient documentation

## 2021-07-09 DIAGNOSIS — E785 Hyperlipidemia, unspecified: Secondary | ICD-10-CM

## 2021-07-09 LAB — POCT GLYCOSYLATED HEMOGLOBIN (HGB A1C): Hemoglobin A1C: 6.2 % — AB (ref 4.0–5.6)

## 2021-07-09 LAB — GLUCOSE, CAPILLARY: Glucose-Capillary: 126 mg/dL — ABNORMAL HIGH (ref 70–99)

## 2021-07-09 MED ORDER — METFORMIN HCL 500 MG PO TABS: 500.0000 mg | ORAL_TABLET | Freq: Every day | ORAL | 3 refills | Status: DC

## 2021-07-09 MED ORDER — BUDESONIDE-FORMOTEROL FUMARATE 160-4.5 MCG/ACT IN AERO: 2.0000 | INHALATION_SPRAY | Freq: Two times a day (BID) | RESPIRATORY_TRACT | 6 refills | Status: DC

## 2021-07-09 MED ORDER — ALBUTEROL SULFATE HFA 108 (90 BASE) MCG/ACT IN AERS: 2.0000 | INHALATION_SPRAY | Freq: Four times a day (QID) | RESPIRATORY_TRACT | 3 refills | Status: DC | PRN

## 2021-07-09 MED ORDER — SENNOSIDES 8.6 MG PO TABS: 1.0000 | ORAL_TABLET | Freq: Every day | ORAL | 2 refills | Status: DC

## 2021-07-09 MED ORDER — CETIRIZINE HCL 10 MG PO TABS: 10.0000 mg | ORAL_TABLET | Freq: Every day | ORAL | 3 refills | Status: DC

## 2021-07-09 MED ORDER — POLYETHYLENE GLYCOL 3350 17 G PO PACK
17.0000 g | PACK | Freq: Two times a day (BID) | ORAL | 2 refills | Status: DC
Start: 2021-07-09 — End: 2022-10-17

## 2021-07-09 MED ORDER — GABAPENTIN 100 MG PO CAPS: 100.0000 mg | ORAL_CAPSULE | Freq: Every day | ORAL | 0 refills | Status: DC

## 2021-07-09 MED ORDER — CLOBETASOL PROPIONATE 0.05 % EX CREA: TOPICAL_CREAM | CUTANEOUS | 1 refills | Status: DC

## 2021-07-09 MED ORDER — CHOLECALCIFEROL 25 MCG (1000 UT) PO TABS
1000.0000 [IU] | ORAL_TABLET | Freq: Every day | ORAL | 2 refills | Status: DC
Start: 2021-07-09 — End: 2022-11-20

## 2021-07-09 MED ORDER — AMLODIPINE BESYLATE 5 MG PO TABS: 5.0000 mg | ORAL_TABLET | Freq: Every day | ORAL | 3 refills | Status: DC

## 2021-07-09 MED ORDER — ATORVASTATIN CALCIUM 40 MG PO TABS: ORAL_TABLET | ORAL | 3 refills | Status: DC

## 2021-07-09 MED ORDER — ATENOLOL 50 MG PO TABS: 50.0000 mg | ORAL_TABLET | Freq: Every day | ORAL | 3 refills | Status: DC

## 2021-07-09 MED ORDER — VALSARTAN 160 MG PO TABS: 160.0000 mg | ORAL_TABLET | Freq: Every day | ORAL | 3 refills | Status: DC

## 2021-07-09 MED ORDER — FLUTICASONE PROPIONATE 50 MCG/ACT NA SUSP: 1.0000 | Freq: Every day | NASAL | 3 refills | Status: DC | PRN

## 2021-07-09 NOTE — Assessment & Plan Note (Signed)
BP Readings from Last 3 Encounters:  07/09/21 134/73  03/27/21 140/66  12/28/20 128/76   Blood pressure remains at goal. Not symptomatic, denying chest pain, dyspnea, palpitations, lightheadedness. We will continue with current regimen.  - Amlodipine 5mg  daily - Atenolol 50mg  daily - Valsartan 160mg  daily

## 2021-07-09 NOTE — Patient Instructions (Signed)
[name], it was a pleasure seeing you today!  Today we discussed: - Dermatology referral has been placed - Cramps: I am doing some lab work today to see if there is any underlying reason you could be having these. In the meantime, I have switched you from Flexeril to gabapentin. You can take one of these at nighttime before bed. We can increase this as needed. - Foot drop: This is likely from your muscle strain. We should continue to watch for now, if it does not improve we can refer to physical therapy.  I have ordered the following labs today:   Lab Orders         BMP8+Anion Gap         Anemia Profile B         Magnesium         TSH         POC Hbg A1C       Referrals ordered today:   Referral Orders         Ambulatory referral to Dermatology       I have ordered the following medication/changed the following medications:   Start the following medications: Meds ordered this encounter  Medications   gabapentin (NEURONTIN) 100 MG capsule    Sig: Take 1 capsule (100 mg total) by mouth at bedtime.    Dispense:  30 capsule    Refill:  0   albuterol (PROVENTIL HFA) 108 (90 Base) MCG/ACT inhaler    Sig: Inhale 2 puffs into the lungs every 6 (six) hours as needed for wheezing or shortness of breath.    Dispense:  18 g    Refill:  3   amLODipine (NORVASC) 5 MG tablet    Sig: Take 1 tablet (5 mg total) by mouth daily.    Dispense:  90 tablet    Refill:  3   atenolol (TENORMIN) 50 MG tablet    Sig: Take 1 tablet (50 mg total) by mouth daily.    Dispense:  90 tablet    Refill:  3   atorvastatin (LIPITOR) 40 MG tablet    Sig: TAKE 1 TABLET BY MOUTH ONCE DAILY AT  6  PM    Dispense:  90 tablet    Refill:  3   Cholecalciferol 25 MCG (1000 UT) tablet    Sig: Take 1 tablet (1,000 Units total) by mouth daily.    Dispense:  30 tablet    Refill:  2   budesonide-formoterol (SYMBICORT) 160-4.5 MCG/ACT inhaler    Sig: Inhale 2 puffs into the lungs in the morning and at  bedtime.    Dispense:  10.2 g    Refill:  6   cetirizine (EQ ALLERGY RELIEF, CETIRIZINE,) 10 MG tablet    Sig: Take 1 tablet (10 mg total) by mouth daily.    Dispense:  90 tablet    Refill:  3   fluticasone (FLONASE) 50 MCG/ACT nasal spray    Sig: Place 1 spray into both nostrils daily as needed for allergies or rhinitis.    Dispense:  16 g    Refill:  3   polyethylene glycol (MIRALAX) 17 g packet    Sig: Take 17 g by mouth 2 (two) times daily.    Dispense:  20 each    Refill:  2   valsartan (DIOVAN) 160 MG tablet    Sig: Take 1 tablet (160 mg total) by mouth daily.    Dispense:  90 tablet  Refill:  3   metFORMIN (GLUCOPHAGE) 500 MG tablet    Sig: Take 1 tablet (500 mg total) by mouth daily with breakfast.    Dispense:  90 tablet    Refill:  3   clobetasol cream (TEMOVATE) 0.05 %    Sig: APPLY 1 APPLICATION TOPICALLY 2 TIMES DAILY. APPLY TO ITCHY AREAS.    Dispense:  30 g    Refill:  1    Please consider 90 day supplies to promote better adherence   senna (SENOKOT) 8.6 MG tablet    Sig: Take 1 tablet (8.6 mg total) by mouth daily.    Dispense:  30 tablet    Refill:  2     Follow-up: 4 months   Please make sure to arrive 15 minutes prior to your next appointment. If you arrive late, you may be asked to reschedule.   We look forward to seeing you next time. Please call our clinic at [phone] if you have any questions or concerns. The best time to call is Monday-Friday from 9am-4pm, but there is someone available 24/7. If after hours or the weekend, call the main hospital number and ask for the Internal Medicine Resident On-Call. If you need medication refills, please notify your pharmacy one week in advance and they will send [place] a request.  Thank you for letting us take part in your care. Wishing you the best!  Thank you, Evlyn Kanner, MD

## 2021-07-09 NOTE — Progress Notes (Signed)
   CC: muscle cramps, foot drop  HPI:  [name] is a 65 y.o. with COPD, dyshidrotic eczema, hypertension, hyperlipidemia presenting to [facility] for muscle cramps and foot drop.  Please see problem-based list for further details.  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   Dyshidrotic eczema    GERD (gastroesophageal reflux disease)    Hyperlipidemia    Hypertension    Myalgia 11/2007   Likely 2/2 Pravachol   Tobacco abuse    Review of Systems:  As per HPI  Physical Exam:  Vitals:   07/09/21 1333 07/09/21 1338  BP: (!) 153/70 134/73  Pulse: 73 72  Resp: (!) 24   Temp: 98.6 F (37 C)   TempSrc: Oral   SpO2: 97%   Weight: 171 lb 9.6 oz (77.8 kg)   Height: 5\' 4"  (1.626 m)    General: Resting comfortably in no acute distress HENT: Multiple skin tags present, most notably on L jaw line CV: Regular rate, rhythm. No murmurs appreciated. Distal pulses 1+ bilaterally. Pulm: Normal work of breathing on room air. Clear to auscultation bilaterally. MSK: Normal bulk, tone. No pitting edema bilaterally.  Neuro: Awake, alert, conversing appropriately. 4/5 motor strength L foot extensors, otherwise 5/5 motor strength throughout rest of bilateral lower extremities. Sensation in tact throughout. Gait with normal stride, cadence, base width. Mild increase in L leg swing for compensation of mild L foot drop. Psych: Normal mood, affect, speech.  Assessment & Plan:   See Encounters Tab for problem based charting.  Patient discussed with Dr. 

## 2021-07-09 NOTE — Assessment & Plan Note (Signed)
Patient reports continuous cramps that are not well-controlled. He reports being on Flexeril for "a long time" but this has not improved his symptoms. Describes being woken up in the middle of the night for these cramps. These sometimes will happen during the day when he is walking, but mainly occur at night. He attributes these to working long hours during the day.  Discussed with patient that we will work up these cramps further for any underlying disease. On exam, patient's distal pulses were difficult to palpate. In-office ABI revealed normal left and right blood flow circulation. We will check electrolytes, TSH, and vitamin deficiencies. In addition, we discussed switching medications for his cramps. We will initiate low-dose gabapentin nightly, and can increase frequency/dosage as needed.  - Start gabapentin 100mg  QHS, increase as needed - Follow-up BMET, Mg, TSH, anemia profile

## 2021-07-09 NOTE — Assessment & Plan Note (Signed)
A1c 6.2% from 6.3% earlier in March. We will continue with daily metformin.  - Metformin 500mg  daily - A1c in 6 mo

## 2021-07-09 NOTE — Assessment & Plan Note (Signed)
Samuel Chen reports continued dry skin despite use of clobetasol cream. In addition, patient has previously tried using over the counter moisturizers. Patient is requesting dermatology appointment for this, in addition to multiple skin tags he would like removed.  - Dermatology referral

## 2021-07-09 NOTE — Assessment & Plan Note (Signed)
Patient reports history of left foot drop over the last few weeks. States that he strained a muscle in his left thigh a few weeks ago. Since this time patient has experienced consistent foot drop. Notes that this has affected him daily.  On exam, gait with normal stride, cadence, and base. There is mild increase in left leg swing likely for compensation due to the L foot drop. On neurologic exam, 4/5 motor left extensors, however rest of exam benign. Most likely mild foot drop to to compression from muscle strain. We will continue to monitor for now with conservative treatment. If persists, we can explore referring to physical therapy.  - Continue to monitor, conservative treatment - Consider referral to physical therapy if persists

## 2021-07-09 NOTE — Assessment & Plan Note (Signed)
-   Patient received influenza vaccine last month - Patient received Shingrix vaccine last month

## 2021-07-09 NOTE — Progress Notes (Deleted)
Referral to dermatologist for rash    Lipid panel? Lab Results  Component Value Date   CHOL 130 08/23/2020   HDL 40 08/23/2020   LDLCALC 59 08/23/2020   TRIG 190 (H) 08/23/2020   CHOLHDL 3.3 08/23/2020  Medication- atorvastatin 40 mg  HTN  Medication- valsartan 160 mg, amlodipine 5 mg, atenolol 50 mg   DM Lab Results  Component Value Date   HGBA1C 6.3 (A) 11/27/2020    Medication- metformin 500 mg     Care gaps: Flu vaccine

## 2021-07-10 ENCOUNTER — Other Ambulatory Visit: Payer: Self-pay | Admitting: Student

## 2021-07-10 DIAGNOSIS — E538 Deficiency of other specified B group vitamins: Secondary | ICD-10-CM | POA: Insufficient documentation

## 2021-07-10 DIAGNOSIS — R252 Cramp and spasm: Secondary | ICD-10-CM

## 2021-07-10 LAB — BMP8+ANION GAP
Anion Gap: 15 mmol/L (ref 10.0–18.0)
BUN/Creatinine Ratio: 17 (ref 10–24)
BUN: 18 mg/dL (ref 8–27)
CO2: 23 mmol/L (ref 20–29)
Calcium: 9.5 mg/dL (ref 8.6–10.2)
Chloride: 105 mmol/L (ref 96–106)
Creatinine, Ser: 1.08 mg/dL (ref 0.76–1.27)
Glucose: 125 mg/dL — ABNORMAL HIGH (ref 70–99)
Potassium: 4.2 mmol/L (ref 3.5–5.2)
Sodium: 143 mmol/L (ref 134–144)
eGFR: 78 mL/min/{1.73_m2} (ref >59)

## 2021-07-10 LAB — ANEMIA PROFILE B
Basophils Absolute: 0 10*3/uL (ref 0.0–0.2)
Basos: 1 %
EOS (ABSOLUTE): 0.1 10*3/uL (ref 0.0–0.4)
Eos: 1 %
Ferritin: 315 ng/mL (ref 30–400)
Folate: 15.2 ng/mL (ref >3.0)
Hematocrit: 37.8 % (ref 37.5–51.0)
Hemoglobin: 13.1 g/dL (ref 13.0–17.7)
Immature Grans (Abs): 0 10*3/uL (ref 0.0–0.1)
Immature Granulocytes: 0 %
Iron Saturation: 31 % (ref 15–55)
Iron: 108 ug/dL (ref 38–169)
Lymphocytes Absolute: 2 10*3/uL (ref 0.7–3.1)
Lymphs: 32 %
MCH: 31.9 pg (ref 26.6–33.0)
MCHC: 34.7 g/dL (ref 31.5–35.7)
MCV: 92 fL (ref 79–97)
Monocytes Absolute: 0.7 10*3/uL (ref 0.1–0.9)
Monocytes: 12 %
Neutrophils Absolute: 3.6 10*3/uL (ref 1.4–7.0)
Neutrophils: 54 %
Platelets: 140 10*3/uL — ABNORMAL LOW (ref 150–450)
RBC: 4.11 x10E6/uL — ABNORMAL LOW (ref 4.14–5.80)
RDW: 12.2 % (ref 11.6–15.4)
Retic Ct Pct: 2.5 % (ref 0.6–2.6)
Total Iron Binding Capacity: 347 ug/dL (ref 250–450)
UIBC: 239 ug/dL (ref 111–343)
Vitamin B-12: 209 pg/mL — ABNORMAL LOW (ref 232–1245)
WBC: 6.5 10*3/uL (ref 3.4–10.8)

## 2021-07-10 LAB — TSH: TSH: 2.68 u[IU]/mL (ref 0.450–4.500)

## 2021-07-10 LAB — MAGNESIUM: Magnesium: 1.7 mg/dL (ref 1.6–2.3)

## 2021-07-10 MED ORDER — GABAPENTIN 100 MG PO CAPS: 100.0000 mg | ORAL_CAPSULE | Freq: Every day | ORAL | 0 refills | Status: DC

## 2021-07-10 NOTE — Assessment & Plan Note (Signed)
During work-up for muscle cramps, patient found to have mild vitamin B12 deficiency. Otherwise, no signs of anemia or iron deficiency. Patient prefers to take over the counter supplements for this.  - OTC vitamin B12 supplement daily - Repeat in 3-6 months

## 2021-07-16 ENCOUNTER — Telehealth: Payer: Self-pay | Admitting: Dermatology

## 2021-07-16 NOTE — Telephone Encounter (Signed)
Patient is calling for a referral appointment from Carlynn Purl, DO.  Patient is scheduled for 01/21/2022 at 2:00 with Janalyn Harder, M.D.

## 2021-07-16 NOTE — Progress Notes (Signed)
Internal Medicine Clinic Attending ? ?Case discussed with Dr. Braswell  At the time of the visit.  We reviewed the resident?s history and exam and pertinent patient test results.  I agree with the assessment, diagnosis, and plan of care documented in the resident?s note.  ?

## 2021-07-17 NOTE — Telephone Encounter (Signed)
Referral attached to appointment

## 2021-08-06 ENCOUNTER — Other Ambulatory Visit: Payer: Self-pay | Admitting: Student

## 2021-08-06 DIAGNOSIS — I10 Essential (primary) hypertension: Secondary | ICD-10-CM

## 2021-08-07 ENCOUNTER — Other Ambulatory Visit: Payer: Self-pay | Admitting: Student

## 2021-08-07 DIAGNOSIS — M7062 Trochanteric bursitis, left hip: Secondary | ICD-10-CM

## 2021-09-04 ENCOUNTER — Ambulatory Visit (INDEPENDENT_AMBULATORY_CARE_PROVIDER_SITE_OTHER): Payer: 59 | Admitting: Internal Medicine

## 2021-09-04 ENCOUNTER — Encounter: Payer: Self-pay | Admitting: Internal Medicine

## 2021-09-04 ENCOUNTER — Telehealth: Payer: Self-pay | Admitting: *Deleted

## 2021-09-04 DIAGNOSIS — J449 Chronic obstructive pulmonary disease, unspecified: Secondary | ICD-10-CM | POA: Diagnosis not present

## 2021-09-04 DIAGNOSIS — J309 Allergic rhinitis, unspecified: Secondary | ICD-10-CM | POA: Diagnosis not present

## 2021-09-04 NOTE — Telephone Encounter (Addendum)
Call from patient.  C/O  not feeling well for a few days.  Blowing out clear to green mucous.  Has taken 2 Covid Test last on yesterday were both negative.  Has some shortness of breath.  Wants to come in for an appointment. No fevers.  Given appointment for this afternoon at 2:15 PM.  Message sent and from Dr. Burnice Logan will do Tele-Health appointment with patient.  Call to patient notified of.  Will await phone call from Dr. Burnice Logan this afternoon.

## 2021-09-04 NOTE — Assessment & Plan Note (Signed)
Patient evaluated via telehealth visit. Initial complaints of sinus infection beginning 4 days ago with headache and clear nasal discharge. However, he also admits to cough with production of green sputum and associated chest pain. He also endorses significant shortness of breath, states he is only able to ambulate approximately 10-15 feet currently before he has stop and catch his breath. He does not have exertional limitations at baseline. He reports new wheeze as well. He has tried using albuterol inhalers but does not report noticeable improvement in symptoms. Also tried ibuprofen and OTC decongestants for symptomatic control with no relief.  He reports subjective fever, but has not checked temperature. Also endorses chills, and headache. His symptoms have shown no improvement since onset. He endorses poor PO intake, but does not recall any lightheadedness upon standing. No nausea or vomiting. He has taken two COVID tests at home, both of which have been negative. He denies any sick contacts.   Concerned for possible COPD exacerbation resulting from upper respiratory infection. He has new shortness of breath with exertional limitation compared to baseline. Feel he would benefit from in person evaluation of respiratory status as well as blood pressure. Would like to obtain O2 sat as well as orthostatics. Offered the patient the option of being seen in clinic later today for in person evaluation vs going to urgent care vs going to ED. Patient declined evaluation today, instead requested antibiotics. Again emphasized the importance of in person evaluation. Patient agreed to office visit 1/4.

## 2021-09-04 NOTE — Assessment & Plan Note (Signed)
Patient reports 4 days of clear sinus discharge from nose as well as sinus discomfort. He has been taking over the counter decongestants with no relief in symptoms. Denies pain in eyes, no vision changes, no neck stiffness.   Can do nasal irrigation with sterile solution and continue fluticasone and cetirizine.

## 2021-09-04 NOTE — Progress Notes (Signed)
°  [facility] Internal Medicine Residency Telephone Encounter Continuity Care Appointment  HPI:  This telephone encounter was created for Mr. Samuel Chen on 09/04/2021 for the following purpose/cc feeling poor and nasal discharge.   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   Dyshidrotic eczema    GERD (gastroesophageal reflux disease)    Hyperlipidemia    Hypertension    Myalgia 11/2007   Likely 2/2 Pravachol   Tobacco abuse      ROS:  Decreased appetite, chills, diaphoresis   Assessment / Plan / Recommendations:  Please see A&P under problem oriented charting for assessment of the patient's acute and chronic medical conditions.  As always, pt is advised that if symptoms worsen or new symptoms arise, they should go to an urgent care facility or to to ER for further evaluation.   Consent and Medical Decision Making:  Patient discussed with Dr. Mayford Knife This is a telephone encounter between Margarita Mail Hoeg and Adron Bene on 09/04/2021 for feeling poor and nasal discharge. The visit was conducted with the patient located at home and Adron Bene at [facility]. The patient's identity was confirmed using their DOB and current address. The patient has consented to being evaluated through a telephone encounter and understands the associated risks (an examination cannot be done and the patient may need to come in for an appointment) / benefits (allows the patient to remain at home, decreasing exposure to coronavirus). I personally spent 15 minutes on medical discussion.

## 2021-09-05 ENCOUNTER — Encounter: Payer: Self-pay | Admitting: Internal Medicine

## 2021-09-05 ENCOUNTER — Ambulatory Visit: Payer: 59 | Admitting: Internal Medicine

## 2021-09-05 ENCOUNTER — Other Ambulatory Visit: Payer: Self-pay

## 2021-09-05 ENCOUNTER — Telehealth: Payer: 59 | Admitting: Internal Medicine

## 2021-09-05 DIAGNOSIS — J441 Chronic obstructive pulmonary disease with (acute) exacerbation: Secondary | ICD-10-CM

## 2021-09-05 MED ORDER — AZITHROMYCIN 250 MG PO TABS: ORAL_TABLET | ORAL | 0 refills | Status: AC

## 2021-09-05 MED ORDER — BENZONATATE 100 MG PO CAPS: 200.0000 mg | ORAL_CAPSULE | Freq: Three times a day (TID) | ORAL | 1 refills | Status: DC | PRN

## 2021-09-05 MED ORDER — PREDNISONE 5 MG PO TABS: 5.0000 mg | ORAL_TABLET | Freq: Every day | ORAL | 0 refills | Status: AC

## 2021-09-05 NOTE — Patient Instructions (Signed)
To Mr. Nickerson,   It was a pleasure meeting you today. Today we discussed your wheezing and cough. We are going to start you on an antibiotic call azithromycin. Please take it as prescribed for 5 days. Additionally, I am giving you steroids to help calm the inflammation, please take 40 mg daily for 5 days.  Please reach back out to [place] if your symptoms do not improve.  Have a good day,  Dolan Amen, MD

## 2021-09-05 NOTE — Progress Notes (Signed)
° °  CC: Shortness of Breath and Wheezing  HPI:  Mr.Tina K Santoyo is a 66 y.o. person, with a PMH noted below, who presents to the clinic for a telehealth follow up. To see the management of their acute and chronic conditions, please see the A&P note under the Encounters tab.   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   Dyshidrotic eczema    GERD (gastroesophageal reflux disease)    Hyperlipidemia    Hypertension    Myalgia 11/2007   Likely 2/2 Pravachol   Tobacco abuse    Review of Systems:   Review of Systems  Constitutional:  Positive for chills, diaphoresis and malaise/fatigue. Negative for fever and weight loss.  Eyes:  Negative for blurred vision.  Respiratory:  Positive for cough, sputum production, shortness of breath and wheezing. Negative for hemoptysis.   Cardiovascular:  Negative for chest pain and palpitations.  Gastrointestinal:  Negative for abdominal pain, diarrhea, nausea and vomiting.    Physical Exam:  Vitals:   09/05/21 1406  BP: (!) 107/54  Pulse: 67  Resp: 18  Temp: 97.6 F (36.4 C)  TempSrc: Oral  SpO2: 96%  Weight: 162 lb (73.5 kg)  Height: 5\' 4"  (1.626 m)   Physical Exam Constitutional:      General: He is not in acute distress.    Appearance: He is not ill-appearing or diaphoretic.     Comments: Sitting in his chair, speaks full sentences, occasional cough  Cardiovascular:     Rate and Rhythm: Normal rate and regular rhythm.     Pulses: Normal pulses.     Heart sounds: Normal heart sounds. No murmur heard.   No friction rub. No gallop.  Pulmonary:     Effort: No respiratory distress.     Breath sounds: No stridor. Wheezing present. No rhonchi or rales.     Comments: Saturating at 96% on RA, effort normal, wheezing auscultated in all lung fields, no rales Or rhonchi appreciated Abdominal:      General: Bowel sounds are normal.     Tenderness: There is no abdominal tenderness. There is no guarding.  Skin:    General: Skin is warm and dry.  Neurological:     Mental Status: He is alert and oriented to person, place, and time.  Psychiatric:        Mood and Affect: Mood normal.        Behavior: Behavior normal.     Assessment & Plan:   See Encounters Tab for problem based charting.  Patient discussed with Dr. 

## 2021-09-06 NOTE — Assessment & Plan Note (Addendum)
Patient presents with 3 days of shortness of breath, wheezing, subjective fevers, and productive cough for 3 days.  States that he has tried over-the-counter cough syrups, his allergy spray, and over-the-counter allergy medications with little relief of his symptoms.  He has used his albuterol inhaler 2-3 times per day this past several days with no additional relief.  He was tested for COVID twice and both results were negative.  Patient progressed to increased  shortness of breath when walking short distances presented after a telehealth visit with the [facility] 09/04/2021.  He denies any dyspnea, chest pain, nausea, vomiting, or diarrhea.  He endorses abdominal pain, but only when he coughs.  A/P Patient with history of COPD presents today with productive cough of green sputum, wheezing, and dyspnea with 2 negative COVID test.  On physical examination he is wheezing bilaterally does not feels.  He is saturating at 96% on room air which is reassuring, he is not tachycardic and not febrile at this time.  Likely secondary to a viral illness, but will cover with azithromycin at this time some steroid.  Patient is worsening symptoms to please call the clinic or present to the nearest ED or urgent care. - Azithromycin for 5-day course - Present 40 mg daily from Mondays - Tessalon Perles 200 3 times daily

## 2021-09-08 NOTE — Progress Notes (Signed)
Internal Medicine Clinic Attending ? ?Case discussed with Dr. Winters  At the time of the visit.  We reviewed the resident?s history and exam and pertinent patient test results.  I agree with the assessment, diagnosis, and plan of care documented in the resident?s note.  ?

## 2021-09-13 ENCOUNTER — Encounter: Payer: Self-pay | Admitting: Internal Medicine

## 2021-09-13 ENCOUNTER — Ambulatory Visit: Payer: 59 | Admitting: Internal Medicine

## 2021-09-13 DIAGNOSIS — J441 Chronic obstructive pulmonary disease with (acute) exacerbation: Secondary | ICD-10-CM | POA: Diagnosis not present

## 2021-09-13 MED ORDER — DEXTROMETHORPHAN HBR 10 MG/5ML PO LIQD: 10.0000 mg | Freq: Four times a day (QID) | ORAL | 0 refills | Status: DC | PRN

## 2021-09-13 MED ORDER — PREDNISONE 20 MG PO TABS: 40.0000 mg | ORAL_TABLET | Freq: Every day | ORAL | 0 refills | Status: AC

## 2021-09-13 NOTE — Patient Instructions (Addendum)
Samuel Chen,   It was a pleasure seeing you again today! Today we discussed your COPD exacerbation. You sound much better today compared to last week. I am going to give you another short round of steroids as there is some residual wheezing.   For your cough and raw throat I have placed an order Dextromethorphan, which is a cough suppressant. Please take 10 mg (5 mL) every 6 hours as needed, if this is not helping, increase your dose to 10 mL. Your exacerbation was likely viral and the cough may last 4-8 weeks. We will see you back in one month. If you feel an increase in your shortness of breath, have fevers, nausea, or vomiting please call the clinic.  Have a good day,  Dolan Amen, MD

## 2021-09-14 NOTE — Assessment & Plan Note (Signed)
Patient presents to the clinic for follow up on his COPD exacerbation. Patient states he feels much better today, but still has some lingering cough and occasional SHOB. He denies any fevers, nausea, vomiting at this time.   A/P:  Breathing is much improved, no coughing during the visit and ambulating down the hall without the need to rest. Still has some wheezing appreciated in the lung fields bilaterally. We discussed that post infectious cough can last up to 8 weeks after infection, but will trial dextromethorphan as well as additional prednisone therapy.  - Prednisone 40 mg 3 days - Dextromethorphan 5 mL q6h PRN

## 2021-09-14 NOTE — Progress Notes (Signed)
° °  CC: Follow Up   HPI:  [name] is a 66 y.o. person, with a PMH noted below, who presents to the clinic Follow up for his COPD exacerbation. To see the management of their acute and chronic conditions, please see the A&P note under the Encounters tab.   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   Dyshidrotic eczema    GERD (gastroesophageal reflux disease)    Hyperlipidemia    Hypertension    Myalgia 11/2007   Likely 2/2 Pravachol   Tobacco abuse    Review of Systems:   Review of Systems  Constitutional:  Negative for chills, diaphoresis, fever, malaise/fatigue and weight loss.  HENT:  Negative for congestion, ear discharge, ear pain, hearing loss, nosebleeds and tinnitus.   Eyes:  Negative for blurred vision, double vision, photophobia and pain.  Respiratory:  Positive for cough and shortness of breath. Negative for hemoptysis, sputum production and wheezing.   Cardiovascular:  Negative for chest pain and palpitations.  Gastrointestinal:  Negative for abdominal pain, constipation, diarrhea, nausea and vomiting.  Musculoskeletal:  Negative for myalgias.    Physical Exam:  Vitals:   09/13/21 1340  BP: (!) 144/65  Pulse: 72  Temp: 98.4 F (36.9 C)  TempSrc: Oral  SpO2: 98%  Weight: 171 lb 9.6 oz (77.8 kg)   Physical Exam Vitals and nursing note reviewed.  Constitutional:      General: He is not in acute distress.    Appearance: Normal appearance. He is not ill-appearing or toxic-appearing.  Cardiovascular:     Rate and Rhythm: Normal rate and regular rhythm.     Pulses: Normal pulses.     Heart sounds: Normal heart sounds. No murmur heard.   No friction rub. No gallop.  Pulmonary:     Effort: Pulmonary effort is normal. No respiratory distress.     Breath sounds: Normal breath sounds. No rhonchi or rales.      Comments: Answering in full sentences, mild wheeze appreciated.  Abdominal:     General: Bowel sounds are normal.     Palpations: Abdomen is soft.     Tenderness: There is no abdominal tenderness. There is no guarding.  Musculoskeletal:        General: No swelling.     Right lower leg: No edema.     Left lower leg: No edema.  Neurological:     General: No focal deficit present.     Mental Status: He is alert and oriented to person, place, and time.  Psychiatric:        Mood and Affect: Mood normal.        Behavior: Behavior normal.     Assessment & Plan:   See Encounters Tab for problem based charting.  Patient discussed with Dr. Mikey Bussing

## 2021-09-24 NOTE — Progress Notes (Signed)
Internal Medicine Clinic Attending ? ?Case discussed with Dr. Winters  At the time of the visit.  We reviewed the resident?s history and exam and pertinent patient test results.  I agree with the assessment, diagnosis, and plan of care documented in the resident?s note.  ?

## 2021-09-27 NOTE — Progress Notes (Signed)
Internal Medicine Clinic Attending ° °Case discussed with Dr. Gawaluck  At the time of the visit.  We reviewed the resident’s history and exam and pertinent patient test results.  I agree with the assessment, diagnosis, and plan of care documented in the resident’s note.  °

## 2021-10-03 ENCOUNTER — Encounter: Payer: 59 | Admitting: Student

## 2021-11-20 ENCOUNTER — Other Ambulatory Visit: Payer: Self-pay | Admitting: Student

## 2021-11-20 DIAGNOSIS — I10 Essential (primary) hypertension: Secondary | ICD-10-CM

## 2021-11-21 ENCOUNTER — Other Ambulatory Visit: Payer: Self-pay | Admitting: Student

## 2021-11-21 DIAGNOSIS — M7062 Trochanteric bursitis, left hip: Secondary | ICD-10-CM

## 2021-11-21 DIAGNOSIS — J329 Chronic sinusitis, unspecified: Secondary | ICD-10-CM

## 2022-01-20 ENCOUNTER — Other Ambulatory Visit: Payer: Self-pay | Admitting: Student

## 2022-01-20 DIAGNOSIS — J310 Chronic rhinitis: Secondary | ICD-10-CM

## 2022-01-21 ENCOUNTER — Ambulatory Visit: Payer: 59 | Admitting: Dermatology

## 2022-02-14 ENCOUNTER — Ambulatory Visit: Payer: 59 | Admitting: Pulmonary Disease

## 2022-02-14 ENCOUNTER — Encounter: Payer: Self-pay | Admitting: Pulmonary Disease

## 2022-02-14 DIAGNOSIS — J449 Chronic obstructive pulmonary disease, unspecified: Secondary | ICD-10-CM | POA: Diagnosis not present

## 2022-02-14 MED ORDER — BUDESONIDE-FORMOTEROL FUMARATE 160-4.5 MCG/ACT IN AERO: 2.0000 | INHALATION_SPRAY | Freq: Two times a day (BID) | RESPIRATORY_TRACT | 6 refills | Status: DC

## 2022-02-14 MED ORDER — ALBUTEROL SULFATE HFA 108 (90 BASE) MCG/ACT IN AERS: 2.0000 | INHALATION_SPRAY | Freq: Four times a day (QID) | RESPIRATORY_TRACT | 3 refills | Status: DC | PRN

## 2022-02-14 NOTE — Patient Instructions (Addendum)
Nice to see you again  Continue Symbicort 2 puffs twice a day - rinse your mouth with water after every use  Continue albuterol as needed  Return clinic in 4 months or sooner as needed

## 2022-02-15 NOTE — Progress Notes (Signed)
@Patient  ID: Samuel Chen, male    DOB: [birth year], 66 y.o.   MRN: [id number]  Chief Complaint  Patient presents with   Follow-up    Pt is here for follow up for allergic rhinitis. Pt states that his allergies ar enot as bad as before. Pt stated he tried [id number] but did not work well for him. Pt is currently on Symbicort, Albuterol, Zyrtec and flonase. Pt states the medications work well for him so far.     Referring provider: [place], MD  HPI:   66 y.o. man whom we are seeing in follow up for evaluation of dyspnea on exertion, emphysema, asthma. Most recent note from PCP office x2 reviewed.  Doing fine. Last seen 11 months ago. Escalated to Breztri at  Last visit given some breakthrough symptoms.  This was too expensive.  Did not think it worked well.  Did not use.  Does not find out about this a little months later.  Continues on Symbicort.    Feels like it works pretty well.  Notably, had exacerbation 09/2021.  Treated with steroids and antibiotics.  Is improved.  Seems to have bronchitis in the winter.  HPI at initial visit: Patient has longstanding history of dyspnea on exertion.  Largely not limited in day-to-day activities by dyspnea.  Has noticed over the last several months mild worsening of dyspnea on exertion.  Worse on inclines or stairs.  Worse when hot.  No time of day when things are better or worse.  No position where things are better or worse.  Other than that he, no seasonal or environmental changes that make things better or worse.  Symptoms have been well controlled on Advair in the past.  Switched to Symbicort by PCP in the interim since last pulmonary appointment.  Most recent chest imaging 12/2020 low-dose CT lung cancer screening scan reviewed interpreted as clear lungs, mild emphysematous changes with predilection for the apices/upper lobes compared to the rest of the lungs, small left upper lobe nodule.  Report is lung RADS 2 regular 5-month  follow-up.  PMH: Hypertension, hyperlipidemia, tobacco abuse in remission, asthma Surgical history: Reviewed, denies any. Family history: He denies any respiratory illnesses in first-degree relatives on review Social history: Former cigarette smoker, 60-pack-year history, continues to vape nicotine without flavor additives per his report, works for the [employer] / Pulmonary Flowsheets:   ACT:  Asthma Control Test ACT Total Score  09/01/2017  2:00 PM 12  07/01/2017  3:00 PM 12    MMRC: mMRC Dyspnea Scale mMRC Score  03/27/2021  1:37 PM 3    Epworth:      No data to display          Tests:   FENO:  No results found for: "NITRICOXIDE"  PFT:     No data to display          WALK:     04/27/2013    9:15 AM  SIX MIN WALK  Supplimental Oxygen during Test? (L/min) No  Tech Comments: Lowest o2 sat during walk 94% RA.      Imaging: Personally reviewed and as per EMR  Lab Results: Personally reviewed, no significant elevation of eosinophils in the past CBC    Component Value Date/Time   WBC 6.5 07/09/2021 1423   WBC 7.1 08/16/2015 1523   RBC 4.11 (L) 07/09/2021 1423   RBC 3.89 (L) 08/16/2015 1545   RBC 3.85 (L) 08/16/2015 1523   HGB 13.1  07/09/2021 1423   HCT 37.8 07/09/2021 1423   PLT 140 (L) 07/09/2021 1423   MCV 92 07/09/2021 1423   MCH 31.9 07/09/2021 1423   MCH 31.2 08/16/2015 1523   MCHC 34.7 07/09/2021 1423   MCHC 33.6 08/16/2015 1523   RDW 12.2 07/09/2021 1423   LYMPHSABS 2.0 07/09/2021 1423   MONOABS 0.8 08/16/2015 1545   EOSABS 0.1 07/09/2021 1423   BASOSABS 0.0 07/09/2021 1423    BMET    Component Value Date/Time   NA 143 07/09/2021 1423   K 4.2 07/09/2021 1423   CL 105 07/09/2021 1423   CO2 23 07/09/2021 1423   GLUCOSE 125 (H) 07/09/2021 1423   GLUCOSE 102 (H) 08/16/2015 1523   BUN 18 07/09/2021 1423   CREATININE 1.08 07/09/2021 1423   CREATININE 0.97 04/14/2014 1334   CALCIUM 9.5 07/09/2021 1423    GFRNONAA 72 08/23/2020 1340   GFRNONAA 88 04/14/2014 1334   GFRAA 84 08/23/2020 1340   GFRAA >89 04/14/2014 1334    BNP No results found for: "BNP"  ProBNP No results found for: "PROBNP"  Specialty Problems       Pulmonary Problems   Allergic rhinitis    Qualifier: Diagnosis of  By: Phillips Odor  DO, Beth        COPD (chronic obstructive pulmonary disease)Gold B    COPD with asthmatic bronchitis smoking induced.  Golds stage Dorian Pod: 8/ 26:  FeV1 72% FeV1/FVC 68%  Chantix failure 2012-2013        Allergies  Allergen Reactions   Acetaminophen     REACTION: (causes liver problems)   Aspirin     REACTION: bleeding/nose bleeds   Augmentin [Amoxicillin-Pot Clavulanate] Diarrhea    Immunization History  Administered Date(s) Administered   Hepatitis B 10/23/2006, 11/24/2006, 05/12/2007   Influenza Split 06/12/2011, 06/24/2012   Influenza Whole 06/16/2007, 06/22/2010   Influenza, Quadrivalent, Recombinant, Inj, Pf 05/30/2018   Influenza,inj,Quad PF,6+ Mos 07/08/2013, 05/18/2014, 06/02/2018   Influenza-Unspecified 06/27/2021   PFIZER(Purple Top)SARS-COV-2 Vaccination 09/10/2019, 10/04/2019, 06/30/2020   Pneumococcal Polysaccharide-23 03/27/2010, 09/19/2018   Td 03/27/2010   Tdap 12/29/2018   Zoster Recombinat (Shingrix) 09/19/2018, 11/18/2018   Zoster, Unspecified 06/27/2021    Past Medical History:  Diagnosis Date   Alcohol abuse    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   Dyshidrotic eczema    GERD (gastroesophageal reflux disease)    Hyperlipidemia    Hypertension    Myalgia 11/2007   Likely 2/2 Pravachol   Tobacco abuse     Tobacco History: Social History   Tobacco Use  Smoking Status Former   Packs/day: 1.50   Years: 40.00   Total pack years: 60.00   Types: E-cigarettes, Cigarettes   Start date: 06/03/1983   Quit date: 03/04/2013   Years since quitting:  8.9  Smokeless Tobacco Current  Tobacco Comments   Uses a Vapor.   Ready to quit: Not Answered Counseling given: Not Answered Tobacco comments: Uses a Vapor.   Continue to not smoke  Outpatient Encounter Medications as of 02/14/2022  Medication Sig   amLODipine (NORVASC) 5 MG tablet Take 1 tablet (5 mg total) by mouth daily.   atenolol (TENORMIN) 50 MG tablet Take 1 tablet (50 mg total) by mouth daily.   atorvastatin (LIPITOR) 40 MG tablet TAKE 1 TABLET BY MOUTH ONCE DAILY AT  6  PM   cetirizine (EQ ALLERGY RELIEF, CETIRIZINE,)  10 MG tablet Take 1 tablet (10 mg total) by mouth daily.   Cholecalciferol 25 MCG (1000 UT) tablet Take 1 tablet (1,000 Units total) by mouth daily.   clobetasol cream (TEMOVATE) [id number] % APPLY 1 APPLICATION TOPICALLY 2 TIMES DAILY. APPLY TO ITCHY AREAS.   Dextromethorphan HBr 10 MG/5ML LIQD Take 5 mLs (10 mg total) by mouth every 6 (six) hours as needed.   fluticasone (FLONASE) 50 MCG/ACT nasal spray Use 2 spray(s) in each nostril once daily   meloxicam (MOBIC) 15 MG tablet TAKE 1 TABLET BY MOUTH ONCE DAILY AS NEEDED FOR PAIN   metFORMIN (GLUCOPHAGE) 500 MG tablet Take 1 tablet (500 mg total) by mouth daily with breakfast.   polyethylene glycol (MIRALAX) 17 g packet Take 17 g by mouth 2 (two) times daily.   senna (SENOKOT) 8.6 MG tablet Take 1 tablet (8.6 mg total) by mouth daily.   Spacer/Aero-Holding Chambers (AEROCHAMBER MV) inhaler Use as instructed   valsartan (DIOVAN) 160 MG tablet Take 1 tablet (160 mg total) by mouth daily.   [DISCONTINUED] albuterol (PROVENTIL HFA) 108 (90 Base) MCG/ACT inhaler Inhale 2 puffs into the lungs every 6 (six) hours as needed for wheezing or shortness of breath.   [DISCONTINUED] benzonatate (TESSALON PERLES) 100 MG capsule Take 2 capsules (200 mg total) by mouth 3 (three) times daily as needed for cough.   [DISCONTINUED] budesonide-formoterol (SYMBICORT) 160-4.5 MCG/ACT inhaler Inhale 2 puffs into the lungs in the morning and at  bedtime.   albuterol (PROVENTIL HFA) 108 (90 Base) MCG/ACT inhaler Inhale 2 puffs into the lungs every 6 (six) hours as needed for wheezing or shortness of breath.   budesonide-formoterol (SYMBICORT) 160-4.5 MCG/ACT inhaler Inhale 2 puffs into the lungs in the morning and at bedtime.   gabapentin (NEURONTIN) 100 MG capsule Take 1 capsule (100 mg total) by mouth at bedtime.   No facility-administered encounter medications on file as of 02/14/2022.     Review of Systems  Review of Systems  No chest pain with exertion.  No orthopnea or PND.  Comprehensive review of systems otherwise negative. Physical Exam  BP 128/70 (BP Location: Left Arm, Patient Position: Sitting, Cuff Size: Normal)   Pulse 71   Temp 98.2 F (36.8 C) (Oral)   Ht 5\' 4"  (1.626 m)   Wt 171 lb 6.4 oz (77.7 kg)   SpO2 95%   BMI 29.42 kg/m   Wt Readings from Last 5 Encounters:  02/14/22 171 lb 6.4 oz (77.7 kg)  09/13/21 171 lb 9.6 oz (77.8 kg)  09/05/21 162 lb (73.5 kg)  07/09/21 171 lb 9.6 oz (77.8 kg)  03/27/21 169 lb 8 oz (76.9 kg)    BMI Readings from Last 5 Encounters:  02/14/22 29.42 kg/m  09/13/21 29.46 kg/m  09/05/21 27.81 kg/m  07/09/21 29.46 kg/m  03/27/21 29.09 kg/m     Physical Exam General: Sitting in chair, no acute distress Eyes: EOMI, no icterus Neck: Supple, no JVP Cardiovascular: Regular rate and rhythm, no murmur appreciated Pulmonary: Normal work of breathing, on room air, a bit distant but clear to auscultation bilaterally Abdomen: Nondistended, bowel sounds present MSK: No synovitis, no joint effusion Neuro: Normal gait, no weakness Psych: Normal mood, full affect  Assessment & Plan:   Asthma: Based on history of bronchitic and atopic symptoms.  Previously pretty well controlled on Advair then switch to Symbicort at a later date.  Overall doing okay on Symbicort.  Exacerbation 09/2021.  Did not tolerate Breztri.  Continue Symbicort for now.  Consider escalation to Trelegy in  the future if exacerbations remain a problem.  Symbicort and albuterol refilled today.   Emphysema: As seen on CT scan.  Related to prior smoking history.  PFTs in the past 2017 and most recently 2018 without fixed obstruction.  Return in about 4 months (around 06/16/2022).   Lanier Clam, MD 02/15/2022

## 2022-03-21 ENCOUNTER — Other Ambulatory Visit: Payer: Self-pay | Admitting: Student

## 2022-03-21 DIAGNOSIS — M7062 Trochanteric bursitis, left hip: Secondary | ICD-10-CM

## 2022-03-22 NOTE — Telephone Encounter (Signed)
Called pt about scheduling an appt ; call transferred to the front office. Appt schedule Wed 7/26 with Dr Marijo Conception.

## 2022-03-27 ENCOUNTER — Ambulatory Visit: Payer: 59 | Admitting: Internal Medicine

## 2022-03-27 ENCOUNTER — Encounter: Payer: Self-pay | Admitting: Internal Medicine

## 2022-03-27 VITALS — BP 158/73 | HR 70 | Temp 98.1°F | Ht 64.0 in | Wt 171.6 lb

## 2022-03-27 DIAGNOSIS — R252 Cramp and spasm: Secondary | ICD-10-CM

## 2022-03-27 DIAGNOSIS — I10 Essential (primary) hypertension: Secondary | ICD-10-CM

## 2022-03-27 DIAGNOSIS — R7303 Prediabetes: Secondary | ICD-10-CM

## 2022-03-27 DIAGNOSIS — M7062 Trochanteric bursitis, left hip: Secondary | ICD-10-CM

## 2022-03-27 DIAGNOSIS — L301 Dyshidrosis [pompholyx]: Secondary | ICD-10-CM

## 2022-03-27 DIAGNOSIS — Z0000 Encounter for general adult medical examination without abnormal findings: Secondary | ICD-10-CM

## 2022-03-27 MED ORDER — MELOXICAM 15 MG PO TABS: ORAL_TABLET | ORAL | 0 refills | Status: DC

## 2022-03-27 MED ORDER — GABAPENTIN 100 MG PO CAPS: 100.0000 mg | ORAL_CAPSULE | Freq: Every day | ORAL | 0 refills | Status: DC

## 2022-03-27 NOTE — Assessment & Plan Note (Signed)
The patient continues to have episodes of dry skin despite the use of clobetasol cream.  He was referred to a dermatologist in November the patient was unhappy with this visit and would like a new referral to a different dermatologist.  Plan: - Referral placed to [facility]

## 2022-03-27 NOTE — Progress Notes (Signed)
CC: medication refill  HPI:  [name] is a 66 y.o. male with hypertension, COPD, hyperlipidemia, and tobaccu use disorder who presents to the [facility] for a medication refill. Please see problem-based list for further details, assessments, and plans.   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   Dyshidrotic eczema    GERD (gastroesophageal reflux disease)    Hyperlipidemia    Hypertension    Myalgia 11/2007   Likely 2/2 Pravachol   Tobacco abuse    Review of Systems:  Review of Systems  Constitutional:  Negative for chills and fever.  HENT: Negative.    Respiratory:  Negative for cough and shortness of breath.   Cardiovascular:  Negative for chest pain and palpitations.  Gastrointestinal:  Negative for diarrhea, nausea and vomiting.  Genitourinary: Negative.   Musculoskeletal:  Positive for joint pain.  Skin:  Positive for itching.  Neurological:  Negative for dizziness and headaches.     Physical Exam:  Vitals:   03/27/22 1339 03/27/22 1351 03/27/22 1418  BP: (!) 167/75 (!) 153/74 (!) 158/73  Pulse: 74 71 70  Temp: 98.1 F (36.7 C)    TempSrc: Oral    SpO2: 97%    Weight: 171 lb 9.6 oz (77.8 kg)    Height: 5\' 4"  (1.626 m)     General: Pleasant, well-appearing male. No acute distress. CV: RRR. No murmurs. No LE edema Pulmonary: Lungs CTAB. Normal work of breathing.  Abdominal: Soft, nontender, nondistended.  Extremities: Normal ROM. Skin: Warm and dry.  Neuro: A&Ox3. No focal deficit. Psych: Normal mood and affect   Assessment & Plan:   See Encounters Tab for problem based charting.  Patient discussed with Dr.  Essential hypertension BP Readings from Last 3 Encounters:  03/27/22 (!) 158/73  02/14/22 128/70  09/13/21 (!) 144/65   Patient's blood pressure is elevated today to 167/75 and  upon recheck, blood pressure is 158/73.  The patient was seen at his pulmonologist office 1 month ago and his blood pressure was in the 120s/70s.  Additionally the patient's blood pressure has been at goal while he has been at the [facility], [facility], [facility] during previous visits. The patient denies any headache, dizziness, blurry vision, chest pain, palpitations, shortness of breath, or any other symptoms at this time.  Plan: -Advised patient to check his blood pressure at home a few times a week and to bring in this log to his next appointment as we may need to increase his amlodipine or his valsartan at that time -BMP today -Follow-up appointment in 1 month for BP recheck  Dyshidrotic eczema The patient continues to have episodes of dry skin despite the use of clobetasol cream.  He was referred to a dermatologist in November the patient was unhappy with this visit and would like a new referral to a different dermatologist.  Plan: - Referral placed to [facility]  Health care maintenance Patient had a referral placed to GI for his screening colonoscopy, however he was not able to make the appointment and is going to call them to reschedule this.  Prediabetes A1c 6.2% in November 2022-we will recheck this today.  The patient is to continue on metformin 500 mg daily.  Muscle cramps The patient continues to endorse muscle cramps that are worse at night.  These cramps also happen during the day  while the patient is walking, but they mainly occur at night.  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  During the patient's last office visit in November this was worked up-he had normal ABIs, normal TSH, normal electrolytes.  He was noted to be deficient in B12 and has been taking a supplement for this every day.  At this office visit the patient was also advised to start gabapentin 100 mg nightly, however the patient did not know about this and has not  been taking it since then.  Plan: - Start gabapentin 100 mg qhs, increase as needed  Trochanteric bursitis, left hip The patient has been taking meloxicam 15 mg as needed daily for hip pain.  This medication has been working well for the patient and he is requesting a refill of this today.  Patient does not take this medication every day and his last prescription of 90 days lasted him approximately 4 months.  Plan: - BMP - Refill meloxicam 15 mg daily PRN

## 2022-03-27 NOTE — Assessment & Plan Note (Signed)
The patient has been taking meloxicam 15 mg as needed daily for hip pain.  This medication has been working well for the patient and he is requesting a refill of this today.  Patient does not take this medication every day and his last prescription of 90 days lasted him approximately 4 months.  Plan: - BMP - Refill meloxicam 15 mg daily PRN

## 2022-03-27 NOTE — Patient Instructions (Signed)
Thank you, Mr.Jaevin K Ridgeway for allowing [place] to provide your care today. Today we discussed:  High blood pressure: Your blood pressure was pretty high today in the office, although it was completely normal at your pulmonologist's office. Please check your blood pressure at home a couple times a week and bring in these numbers to your next office visit in 1 month. We may need to make some changes to your medicine then Prediabetes: Continue to take metformin once a day, we are rechecking your a1c today Osteoarthritis: Refilled your meloxicam, continue to take once a day as needed for your pain. We are rechecking your kidney function today, as meloxicam can sometimes mess with the kidneys Muscle cramps: Start taking gabapentin 100 mg each night to see if this helps  Eczema: I am referring you to a new dermatologist  I have ordered the following labs for you:   Lab Orders         BMP8+Anion Gap         Hemoglobin A1c        Referrals ordered today:    Referral Orders         Ambulatory referral to Dermatology      I have ordered the following medication/changed the following medications:   Stop the following medications: Medications Discontinued During This Encounter  Medication Reason   gabapentin (NEURONTIN) 100 MG capsule Reorder   meloxicam (MOBIC) 15 MG tablet Reorder     Start the following medications: Meds ordered this encounter  Medications   meloxicam (MOBIC) 15 MG tablet    Sig: Take 1 tablet by mouth once daily as needed for pain    Dispense:  90 tablet    Refill:  0   gabapentin (NEURONTIN) 100 MG capsule    Sig: Take 1 capsule (100 mg total) by mouth at bedtime.    Dispense:  30 capsule    Refill:  0     Follow up:  1 month for BP recheck     Remember: If you are noticing your blood pressure running high at home (higher than 140/90), please call us to be seen sooner  Should you have any questions or concerns please call the internal medicine clinic at  [phone].     Elza Rafter, D.O. [facility]

## 2022-03-27 NOTE — Assessment & Plan Note (Signed)
The patient continues to endorse muscle cramps that are worse at night.  These cramps also happen during the day while the patient is walking, but they mainly occur at night.  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  During the patient's last office visit in November this was worked up-he had normal ABIs, normal TSH, normal electrolytes.  He was noted to be deficient in B12 and has been taking a supplement for this every day.  At this office visit the patient was also advised to start gabapentin 100 mg nightly, however the patient did not know about this and has not been taking it since then.  Plan: - Start gabapentin 100 mg qhs, increase as needed

## 2022-03-27 NOTE — Assessment & Plan Note (Signed)
A1c 6.2% in November 2022-we will recheck this today.  The patient is to continue on metformin 500 mg daily.  Addendum 7/27: A1c 6.5%, now within the range for diabetes. Advised patient to increase metformin to 500 mg bid and he is in agreement.

## 2022-03-27 NOTE — Assessment & Plan Note (Signed)
Patient had a referral placed to GI for his screening colonoscopy, however he was not able to make the appointment and is going to call them to reschedule this.

## 2022-03-27 NOTE — Assessment & Plan Note (Signed)
BP Readings from Last 3 Encounters:  03/27/22 (!) 158/73  02/14/22 128/70  09/13/21 (!) 144/65   Patient's blood pressure is elevated today to 167/75 and upon recheck, blood pressure is 158/73.  The patient was seen at his pulmonologist office 1 month ago and his blood pressure was in the 120s/70s.  Additionally the patient's blood pressure has been at goal while he has been at the [facility] during previous visits. The patient denies any headache, dizziness, blurry vision, chest pain, palpitations, shortness of breath, or any other symptoms at this time.  Plan: -Advised patient to check his blood pressure at home a few times a week and to bring in this log to his next appointment as we may need to increase his amlodipine or his valsartan at that time -BMP today -Follow-up appointment in 1 month for BP recheck  Addendum: Kidney function and electrolytes within normal limits. Patient made aware.

## 2022-03-28 LAB — BMP8+ANION GAP
Anion Gap: 17 mmol/L (ref 10.0–18.0)
BUN/Creatinine Ratio: 11 (ref 10–24)
BUN: 11 mg/dL (ref 8–27)
CO2: 19 mmol/L — ABNORMAL LOW (ref 20–29)
Calcium: 9.9 mg/dL (ref 8.6–10.2)
Chloride: 104 mmol/L (ref 96–106)
Creatinine, Ser: 1.02 mg/dL (ref 0.76–1.27)
Glucose: 136 mg/dL — ABNORMAL HIGH (ref 70–99)
Potassium: 4.5 mmol/L (ref 3.5–5.2)
Sodium: 140 mmol/L (ref 134–144)
eGFR: 83 mL/min/{1.73_m2} (ref >59)

## 2022-03-28 LAB — HEMOGLOBIN A1C
Est. average glucose Bld gHb Est-mCnc: 140 mg/dL
Hgb A1c MFr Bld: 6.5 % — ABNORMAL HIGH (ref 4.8–5.6)

## 2022-03-28 MED ORDER — METFORMIN HCL 500 MG PO TABS: 500.0000 mg | ORAL_TABLET | Freq: Two times a day (BID) | ORAL | 5 refills | Status: DC

## 2022-03-28 NOTE — Addendum Note (Signed)
Addended by: Elza Rafter on: 03/28/2022 01:39 PM   Modules accepted: Orders

## 2022-04-01 ENCOUNTER — Telehealth: Payer: Self-pay | Admitting: Student

## 2022-04-01 NOTE — Telephone Encounter (Signed)
Metformin ordered 1 tab BID with meals. Called pt - pt instructed to take 1 tab in the morning w/meal and then 1 tab in the evening w/meal. Pt repeated instruction.

## 2022-04-01 NOTE — Telephone Encounter (Signed)
Pt requesting a call back about how to take his medication listed below.   metFORMIN (GLUCOPHAGE) 500 MG tablet

## 2022-04-01 NOTE — Progress Notes (Signed)
Internal Medicine Clinic Attending ° °Case discussed with Dr. Atway  at the time of the visit.  We reviewed the resident’s history and exam and pertinent patient test results.  I agree with the assessment, diagnosis, and plan of care documented in the resident’s note.  °

## 2022-04-29 ENCOUNTER — Ambulatory Visit: Payer: 59 | Admitting: Student

## 2022-04-29 ENCOUNTER — Encounter: Payer: Self-pay | Admitting: Student

## 2022-04-29 ENCOUNTER — Other Ambulatory Visit: Payer: Self-pay

## 2022-04-29 VITALS — BP 156/79 | HR 62 | Temp 98.1°F | Ht 64.0 in | Wt 173.0 lb

## 2022-04-29 DIAGNOSIS — Z87891 Personal history of nicotine dependence: Secondary | ICD-10-CM | POA: Diagnosis not present

## 2022-04-29 DIAGNOSIS — E119 Type 2 diabetes mellitus without complications: Secondary | ICD-10-CM

## 2022-04-29 DIAGNOSIS — Z7984 Long term (current) use of oral hypoglycemic drugs: Secondary | ICD-10-CM | POA: Diagnosis not present

## 2022-04-29 DIAGNOSIS — I10 Essential (primary) hypertension: Secondary | ICD-10-CM

## 2022-04-29 DIAGNOSIS — R252 Cramp and spasm: Secondary | ICD-10-CM

## 2022-04-29 DIAGNOSIS — Z0000 Encounter for general adult medical examination without abnormal findings: Secondary | ICD-10-CM

## 2022-04-29 MED ORDER — GABAPENTIN 100 MG PO CAPS: 100.0000 mg | ORAL_CAPSULE | Freq: Every day | ORAL | 6 refills | Status: DC

## 2022-04-29 MED ORDER — AMLODIPINE BESYLATE 10 MG PO TABS: 10.0000 mg | ORAL_TABLET | Freq: Every day | ORAL | 0 refills | Status: DC

## 2022-04-29 NOTE — Progress Notes (Signed)
Subjective:   Patient ID: Samuel Chen male   DOB: 1956/01/04 66 y.o.   MRN: [id number]  HPI: [name] is a 66 y.o. with the past medical history listed below who presents for 64-month hypertension follow-up. Please see Plan for individualized problem-based charting.   Patient Active Problem List   Diagnosis Date Noted   Vitamin B12 deficiency 07/10/2021   Left foot drop 07/09/2021   H/O seasonal allergies 11/28/2020   Chronic idiopathic constipation 05/06/2019   Trochanteric bursitis, left hip 04/28/2017   Diabetes (HCC) 04/06/2017   Left hip pain 04/01/2016   Normocytic anemia 08/16/2015   Health care maintenance 05/18/2014   Muscle cramps 08/13/2013   Reactive cervical lymphadenopathy 04/13/2013   Avascular necrosis of bone of right hip (HCC) 01/21/2013   COPD (chronic obstructive pulmonary disease)Gold B    Pain in joint, shoulder region 10/16/2009   Allergic rhinitis 04/01/2007   Hyperlipidemia 10/23/2006   ERECTILE DYSFUNCTION 10/23/2006   Alcohol abuse 10/23/2006   History of tobacco abuse 10/23/2006   Essential hypertension 10/23/2006   Gastroesophageal reflux disease 10/23/2006   Dyshidrotic eczema 10/23/2006   ALCOHOLIC HEPATITIS, HX OF 10/23/2006     Current Outpatient Medications  Medication Sig Dispense Refill   amLODipine (NORVASC) 10 MG tablet Take 1 tablet (10 mg total) by mouth daily. 60 tablet 0   albuterol (PROVENTIL HFA) 108 (90 Base) MCG/ACT inhaler Inhale 2 puffs into the lungs every 6 (six) hours as needed for wheezing or shortness of breath. 18 g 3   atenolol (TENORMIN) 50 MG tablet Take 1 tablet (50 mg total) by mouth daily. 90 tablet 3   atorvastatin (LIPITOR) 40 MG tablet TAKE 1 TABLET BY MOUTH ONCE DAILY AT  6  PM 90 tablet 3   budesonide-formoterol (SYMBICORT) 160-4.5 MCG/ACT inhaler Inhale 2 puffs into the lungs in the morning and at bedtime. 10.2 g 6   cetirizine (EQ ALLERGY RELIEF, CETIRIZINE,) 10 MG tablet Take 1 tablet  (10 mg total) by mouth daily. 90 tablet 3   Cholecalciferol 25 MCG (1000 UT) tablet Take 1 tablet (1,000 Units total) by mouth daily. 30 tablet 2   clobetasol cream (TEMOVATE) 0.05 % APPLY 1 APPLICATION TOPICALLY 2 TIMES DAILY. APPLY TO ITCHY AREAS. 30 g 1   Dextromethorphan HBr 10 MG/5ML LIQD Take 5 mLs (10 mg total) by mouth every 6 (six) hours as needed. 118 mL 0   fluticasone (FLONASE) 50 MCG/ACT nasal spray Use 2 spray(s) in each nostril once daily 16 g 3   gabapentin (NEURONTIN) 100 MG capsule Take 1 capsule (100 mg total) by mouth at bedtime. 30 capsule 6   meloxicam (MOBIC) 15 MG tablet Take 1 tablet by mouth once daily as needed for pain 90 tablet 0   metFORMIN (GLUCOPHAGE) 500 MG tablet Take 1 tablet (500 mg total) by mouth 2 (two) times daily with a meal. 120 tablet 5   polyethylene glycol (MIRALAX) 17 g packet Take 17 g by mouth 2 (two) times daily. 20 each 2   senna (SENOKOT) 8.6 MG tablet Take 1 tablet (8.6 mg total) by mouth daily. 30 tablet 2   Spacer/Aero-Holding Chambers (AEROCHAMBER MV) inhaler Use as instructed 1 each 0   valsartan (DIOVAN) 160 MG tablet Take 1 tablet (160 mg total) by mouth daily. 90 tablet 3   No current facility-administered medications for this visit.     Review of Systems: Review of systems is negative other than what is noted in  individual problem-based charting.    Objective:   Physical Exam: Vitals:   04/29/22 1339 04/29/22 1406  BP: (!) 178/78 (!) 156/79  Pulse: 65 62  Temp: 98.1 F (36.7 C)   TempSrc: Oral   SpO2: 98%   Weight: 173 lb (78.5 kg)   Height: 5\' 4"  (1.626 m)    Physical Exam Constitutional:      General: He is not in acute distress. Cardiovascular:     Rate and Rhythm: Normal rate and regular rhythm.     Pulses: Normal pulses.     Heart sounds: Normal heart sounds. No murmur heard.    No friction rub. No gallop.  Pulmonary:     Effort: Pulmonary effort is normal. No respiratory distress.     Breath sounds: Normal  breath sounds. No wheezing.  Abdominal:     General: There is no distension.     Palpations: Abdomen is soft.     Tenderness: There is no abdominal tenderness. There is no guarding.  Skin:    General: Skin is warm and dry.  Neurological:     Mental Status: He is alert and oriented to person, place, and time.      Assessment & Plan:   Essential hypertension Patient's blood pressure is still elevated today, 178/78 and 156/79 on recheck. Patient is currently taking amlodipine, valsartan, and atenolol. Patient's recorded home BP ranges from 130s-150s/70s. Patient denies dizziness, vision changes, LE edema, chest pain, or palpitations and denies any side effects from medications. Patient denies nighttime apneic episodes that he is aware of.  A&P: BP in office and home readings are elevated. Patient's BMI is 29.7, and would benefit from lifestyle changes in addition to medication changes. We introduced DASH diet today and will continue to follow progress at next visit in 2 months. Plan: -Increase Amlodipine to 10mg  -Continue Valsartan 160mg  -Continue Atenolol 50mg  -Provided patient with DASH diet handout   Diabetes (HCC) Patient recently diagnosed with diabetes, last month's HbA1c=6.5%. Patient reports he has been taking Metformin 500mg  BID and reports no side effects. Plan to follow up in two months and will do labwork. Plan: -Continue Metformin 500mg  BID -A1c, Lipid panel next visit     Health care maintenance Patient plans to schedule a colonoscopy around November with [facility].

## 2022-04-29 NOTE — Assessment & Plan Note (Addendum)
Patient recently diagnosed with diabetes, last month's HbA1c=6.5%. Patient reports he has been taking Metformin 500mg  BID and reports no side effects. Plan to follow up in two months and will do labwork. Plan: -Continue Metformin 500mg  BID -A1c, Lipid panel next visit

## 2022-04-29 NOTE — Patient Instructions (Addendum)
Thank you for seeing Samuel Chen today Samuel Chen! Your blood pressure today was 178/78 and 156/79 on repeat.   We are increasing your Amlodipine to 10mg . We also refilled your Gabapentin. Please continue to take all your other medications as prescribed and let know if you have any trouble with your prescriptions.   Please follow up with [facility] to schedule a colonoscopy this year.   Next visit we will plan to recheck your blood pressure and get some bloodwork.

## 2022-04-29 NOTE — Assessment & Plan Note (Addendum)
Patient's blood pressure is still elevated today, 178/78 and 156/79 on recheck. Patient is currently taking amlodipine, valsartan, and atenolol. Patient's recorded home BP ranges from 130s-150s/70s. Patient denies dizziness, vision changes, LE edema, chest pain, or palpitations and denies any side effects from medications. Patient denies nighttime apneic episodes that he is aware of.  A&P: BP in office and home readings are elevated. Patient's BMI is 29.7, and would benefit from lifestyle changes in addition to medication changes. We introduced DASH diet today and will continue to follow progress at next visit in 2 months. Plan: -Increase Amlodipine to 10mg  -Continue Valsartan 160mg  -Continue Atenolol 50mg  -Provided patient with DASH diet handout

## 2022-04-29 NOTE — Assessment & Plan Note (Signed)
Patient plans to schedule a colonoscopy around November with [facility].

## 2022-05-01 NOTE — Progress Notes (Signed)
Internal Medicine Clinic Attending ? ?Case discussed with Dr. Braswell  At the time of the visit.  We reviewed the resident?s history and exam and pertinent patient test results.  I agree with the assessment, diagnosis, and plan of care documented in the resident?s note.  ?

## 2022-05-11 ENCOUNTER — Other Ambulatory Visit: Payer: Self-pay | Admitting: Student

## 2022-05-11 DIAGNOSIS — L301 Dyshidrosis [pompholyx]: Secondary | ICD-10-CM

## 2022-06-04 ENCOUNTER — Ambulatory Visit: Payer: 59 | Admitting: Pulmonary Disease

## 2022-06-04 ENCOUNTER — Encounter: Payer: Self-pay | Admitting: Pulmonary Disease

## 2022-06-04 VITALS — BP 124/66 | HR 63 | Temp 98.5°F | Ht 64.0 in | Wt 172.0 lb

## 2022-06-04 DIAGNOSIS — J438 Other emphysema: Secondary | ICD-10-CM

## 2022-06-04 DIAGNOSIS — J4540 Moderate persistent asthma, uncomplicated: Secondary | ICD-10-CM

## 2022-06-04 MED ORDER — BREZTRI AEROSPHERE 160-9-4.8 MCG/ACT IN AERO: 2.0000 | INHALATION_SPRAY | Freq: Two times a day (BID) | RESPIRATORY_TRACT | 0 refills | Status: DC

## 2022-06-04 NOTE — Progress Notes (Signed)
@Patient  ID: , male    DOB: December 20, 1955, 66 y.o.   MRN: [id number]  Chief Complaint  Patient presents with   Follow-up    Follow up for COPD. Pt states no changes with breathing. Pt is currently on Symbicort daily and abluterol as needed     Referring provider: [id number], MD  HPI:   66 y.o. man whom we are seeing in follow up for evaluation of dyspnea on exertion, emphysema, asthma. Most recent note from PCP office x2 reviewed.  Doing ok. No exacerbations since last visit.  Reports adherence to Symbicort.  Had some samples of Breztri.  Used for couple days and felt like breathing was a little bit better.  In the past when he tried 66 he had a lot of cough but no cough at this time.  Review of record indicates it also was too expensive last time.  He has no concerns today.  HPI at initial visit: Patient has longstanding history of dyspnea on exertion.  Largely not limited in day-to-day activities by dyspnea.  Has noticed over the last several months mild worsening of dyspnea on exertion.  Worse on inclines or stairs.  Worse when hot.  No time of day when things are better or worse.  No position where things are better or worse.  Other than that he, no seasonal or environmental changes that make things better or worse.  Symptoms have been well controlled on Advair in the past.  Switched to Symbicort by PCP in the interim since last pulmonary appointment.  Most recent chest imaging 12/2020 low-dose CT lung cancer screening scan reviewed interpreted as clear lungs, mild emphysematous changes with predilection for the apices/upper lobes compared to the rest of the lungs, small left upper lobe nodule.  Report is lung RADS 2 regular 66-month follow-up.  PMH: Hypertension, hyperlipidemia, tobacco abuse in remission, asthma Surgical history: Reviewed, denies any. Family history: He denies any respiratory illnesses in first-degree relatives on review Social history:  Former cigarette smoker, 60-pack-year history, continues to vape nicotine without flavor additives per his report, works for the city of [employer] / Pulmonary Flowsheets:   ACT:  Asthma Control Test ACT Total Score  09/01/2017  2:00 PM 12  07/01/2017  3:00 PM 12    MMRC: mMRC Dyspnea Scale mMRC Score  03/27/2021  1:37 PM 3    Epworth:      No data to display           Tests:   FENO:  No results found for: "NITRICOXIDE"  PFT:     No data to display           WALK:     04/27/2013    9:15 AM  SIX MIN WALK  Supplimental Oxygen during Test? (L/min) No  Tech Comments: Lowest o2 sat during walk 94% RA.      Imaging: Personally reviewed and as per EMR  Lab Results: Personally reviewed, no significant elevation of eosinophils in the past CBC    Component Value Date/Time   WBC 6.5 07/09/2021 1423   WBC 7.1 08/16/2015 1523   RBC 4.11 (L) 07/09/2021 1423   RBC 3.89 (L) 08/16/2015 1545   RBC 3.85 (L) 08/16/2015 1523   HGB 13.1 07/09/2021 1423   HCT 37.8 07/09/2021 1423   PLT 140 (L) 07/09/2021 1423   MCV 92 07/09/2021 1423   MCH 31.9 07/09/2021 1423   MCH 31.2 08/16/2015 1523   MCHC 34.7 07/09/2021 1423  MCHC 33.6 08/16/2015 1523   RDW 12.2 07/09/2021 1423   LYMPHSABS 2.0 07/09/2021 1423   MONOABS 0.8 08/16/2015 1545   EOSABS 0.1 07/09/2021 1423   BASOSABS 0.0 07/09/2021 1423    BMET    Component Value Date/Time   NA 140 03/27/2022 1420   K 4.5 03/27/2022 1420   CL 104 03/27/2022 1420   CO2 19 (L) 03/27/2022 1420   GLUCOSE 136 (H) 03/27/2022 1420   GLUCOSE 102 (H) 08/16/2015 1523   BUN 11 03/27/2022 1420   CREATININE 1.02 03/27/2022 1420   CREATININE 0.97 04/14/2014 1334   CALCIUM 9.9 03/27/2022 1420   GFRNONAA 72 08/23/2020 1340   GFRNONAA 88 04/14/2014 1334   GFRAA 84 08/23/2020 1340   GFRAA >89 04/14/2014 1334    BNP No results found for: "BNP"  ProBNP No results found for: "PROBNP"  Specialty Problems        Pulmonary Problems   Allergic rhinitis    Qualifier: Diagnosis of  By: Phillips Odor  DO, Beth        COPD (chronic obstructive pulmonary disease)Gold B    COPD with asthmatic bronchitis smoking induced.  Golds stage Dorian Pod: 8/ 26:  FeV1 72% FeV1/FVC 68%  Chantix failure 2012-2013        Allergies  Allergen Reactions   Acetaminophen     REACTION: (causes liver problems)   Aspirin     REACTION: bleeding/nose bleeds   Augmentin [Amoxicillin-Pot Clavulanate] Diarrhea    Immunization History  Administered Date(s) Administered   Hepatitis B 10/23/2006, 11/24/2006, 05/12/2007   Influenza Split 06/12/2011, 06/24/2012   Influenza Whole 06/16/2007, 06/22/2010   Influenza, Quadrivalent, Recombinant, Inj, Pf 05/30/2018   Influenza,inj,Quad PF,6+ Mos 07/08/2013, 05/18/2014, 06/02/2018   Influenza-Unspecified 06/27/2021   PFIZER(Purple Top)SARS-COV-2 Vaccination 09/10/2019, 10/04/2019, 06/30/2020   Pneumococcal Polysaccharide-23 03/27/2010, 09/19/2018   Td 03/27/2010   Tdap 12/29/2018   Zoster Recombinat (Shingrix) 09/19/2018, 11/18/2018   Zoster, Unspecified 06/27/2021    Past Medical History:  Diagnosis Date   Alcohol abuse    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   Dyshidrotic eczema    GERD (gastroesophageal reflux disease)    Hyperlipidemia    Hypertension    Myalgia 11/2007   Likely 2/2 Pravachol   Tobacco abuse     Tobacco History: Social History   Tobacco Use  Smoking Status Former   Packs/day: 1.50   Years: 40.00   Total pack years: 60.00   Types: E-cigarettes, Cigarettes   Start date: 06/03/1983   Quit date: 03/04/2013   Years since quitting: 9.2  Smokeless Tobacco Current  Tobacco Comments   Uses a Vapor.   Ready to quit: Not Answered Counseling given: Not Answered Tobacco comments: Uses a Vapor.   Continue to not smoke  Outpatient Encounter Medications  as of 06/04/2022  Medication Sig   albuterol (PROVENTIL HFA) 108 (90 Base) MCG/ACT inhaler Inhale 2 puffs into the lungs every 6 (six) hours as needed for wheezing or shortness of breath.   amLODipine (NORVASC) 10 MG tablet Take 1 tablet (10 mg total) by mouth daily.   atenolol (TENORMIN) 50 MG tablet Take 1 tablet (50 mg total) by mouth daily.   atorvastatin (LIPITOR) 40 MG tablet TAKE 1 TABLET BY MOUTH ONCE DAILY AT  6  PM   budesonide-formoterol (SYMBICORT) 160-4.5 MCG/ACT inhaler Inhale 2 puffs into the lungs in the morning and at bedtime.  cetirizine (EQ ALLERGY RELIEF, CETIRIZINE,) 10 MG tablet Take 1 tablet (10 mg total) by mouth daily.   Cholecalciferol 25 MCG (1000 UT) tablet Take 1 tablet (1,000 Units total) by mouth daily.   clobetasol cream (TEMOVATE) 1.30 % APPLY 1 APPLICATION TOPICALLY TO ITCHY AREAS TWICE DAILY   Dextromethorphan HBr 10 MG/5ML LIQD Take 5 mLs (10 mg total) by mouth every 6 (six) hours as needed.   fluticasone (FLONASE) 50 MCG/ACT nasal spray Use 2 spray(s) in each nostril once daily   gabapentin (NEURONTIN) 100 MG capsule Take 1 capsule (100 mg total) by mouth at bedtime.   meloxicam (MOBIC) 15 MG tablet Take 1 tablet by mouth once daily as needed for pain   metFORMIN (GLUCOPHAGE) 500 MG tablet Take 1 tablet (500 mg total) by mouth 2 (two) times daily with a meal.   polyethylene glycol (MIRALAX) 17 g packet Take 17 g by mouth 2 (two) times daily.   senna (SENOKOT) 8.6 MG tablet Take 1 tablet (8.6 mg total) by mouth daily.   Spacer/Aero-Holding Chambers (AEROCHAMBER MV) inhaler Use as instructed   valsartan (DIOVAN) 160 MG tablet Take 1 tablet (160 mg total) by mouth daily.   No facility-administered encounter medications on file as of 06/04/2022.     Review of Systems  Review of Systems  N/a Physical Exam  BP 124/66 (BP Location: Left Arm, Patient Position: Sitting, Cuff Size: Normal)   Pulse 63   Temp 98.5 F (36.9 C) (Oral)   Ht 5\' 4"  (1.626 m)   Wt  172 lb (78 kg)   SpO2 93%   BMI 29.52 kg/m   Wt Readings from Last 5 Encounters:  06/04/22 172 lb (78 kg)  04/29/22 173 lb (78.5 kg)  03/27/22 171 lb 9.6 oz (77.8 kg)  02/14/22 171 lb 6.4 oz (77.7 kg)  09/13/21 171 lb 9.6 oz (77.8 kg)    BMI Readings from Last 5 Encounters:  06/04/22 29.52 kg/m  04/29/22 29.70 kg/m  03/27/22 29.46 kg/m  02/14/22 29.42 kg/m  09/13/21 29.46 kg/m     Physical Exam General: Sitting in chair, no acute distress Eyes: EOMI, no icterus Neck: Supple, no JVP Cardiovascular: Regular rate and rhythm, no murmur appreciated Pulmonary: Normal work of breathing, on room air, a bit distant but clear to auscultation bilaterally Abdomen: Nondistended, bowel sounds present MSK: No synovitis, no joint effusion Neuro: Normal gait, no weakness Psych: Normal mood, full affect  Assessment & Plan:   Asthma: Based on history of bronchitic and atopic symptoms.  Previously pretty well controlled on Advair then switch to Symbicort at a later date.  Overall doing okay on Symbicort.  Exacerbation 09/2021.  Escalate to [place], cost prohibitive in the past. If helpful will prescribe and try again. If cost prohibitive or not helpful then will continue Symbicort.   Emphysema: As seen on CT scan.  Related to prior smoking history.  PFTs in the past 2017 and most recently 2018 without fixed obstruction.  Return in about 6 months (around 12/04/2022).   Lanier Clam, MD 06/04/2022

## 2022-06-04 NOTE — Patient Instructions (Signed)
Nice to see you again  Try the [facility] for a couple of weeks.  2 puffs twice a day.  Rinse your mouth out with water after every use.  If you think that your symptoms are significantly better than on the Symbicort let me know and I will prescribe this.  If its not covered by insurance and it is cost prohibitive then we will just go back to the Symbicort.  Return to clinic in 6 months or sooner if needed with Dr. Silas Flood

## 2022-06-20 ENCOUNTER — Other Ambulatory Visit: Payer: Self-pay | Admitting: Internal Medicine

## 2022-06-20 DIAGNOSIS — M7062 Trochanteric bursitis, left hip: Secondary | ICD-10-CM

## 2022-06-21 ENCOUNTER — Other Ambulatory Visit: Payer: Self-pay | Admitting: Student

## 2022-06-21 DIAGNOSIS — J329 Chronic sinusitis, unspecified: Secondary | ICD-10-CM

## 2022-06-25 ENCOUNTER — Other Ambulatory Visit: Payer: Self-pay | Admitting: Student

## 2022-06-25 DIAGNOSIS — I10 Essential (primary) hypertension: Secondary | ICD-10-CM

## 2022-06-26 LAB — HM DIABETES EYE EXAM

## 2022-07-03 ENCOUNTER — Encounter: Payer: 59 | Admitting: Student

## 2022-07-10 ENCOUNTER — Telehealth: Payer: Self-pay

## 2022-07-10 ENCOUNTER — Other Ambulatory Visit: Payer: Self-pay | Admitting: *Deleted

## 2022-07-10 DIAGNOSIS — R7303 Prediabetes: Secondary | ICD-10-CM

## 2022-07-10 MED ORDER — METFORMIN HCL 500 MG PO TABS: 500.0000 mg | ORAL_TABLET | Freq: Two times a day (BID) | ORAL | 5 refills | Status: DC

## 2022-07-10 NOTE — Telephone Encounter (Signed)
Requesting to speak with a nurse about metFORMIN (GLUCOPHAGE) 500 MG tablet. Please call pt back.

## 2022-07-18 ENCOUNTER — Encounter: Payer: Self-pay | Admitting: Student

## 2022-07-18 ENCOUNTER — Other Ambulatory Visit: Payer: Self-pay

## 2022-07-18 ENCOUNTER — Ambulatory Visit: Payer: 59 | Admitting: Student

## 2022-07-18 VITALS — BP 148/64 | HR 73 | Temp 97.1°F | Ht 64.0 in | Wt 174.4 lb

## 2022-07-18 DIAGNOSIS — R051 Acute cough: Secondary | ICD-10-CM

## 2022-07-18 DIAGNOSIS — Z87891 Personal history of nicotine dependence: Secondary | ICD-10-CM

## 2022-07-18 DIAGNOSIS — E785 Hyperlipidemia, unspecified: Secondary | ICD-10-CM | POA: Diagnosis not present

## 2022-07-18 DIAGNOSIS — R252 Cramp and spasm: Secondary | ICD-10-CM

## 2022-07-18 DIAGNOSIS — E119 Type 2 diabetes mellitus without complications: Secondary | ICD-10-CM

## 2022-07-18 DIAGNOSIS — I10 Essential (primary) hypertension: Secondary | ICD-10-CM

## 2022-07-18 DIAGNOSIS — R7303 Prediabetes: Secondary | ICD-10-CM | POA: Diagnosis not present

## 2022-07-18 DIAGNOSIS — J449 Chronic obstructive pulmonary disease, unspecified: Secondary | ICD-10-CM

## 2022-07-18 DIAGNOSIS — M7062 Trochanteric bursitis, left hip: Secondary | ICD-10-CM

## 2022-07-18 DIAGNOSIS — J329 Chronic sinusitis, unspecified: Secondary | ICD-10-CM

## 2022-07-18 NOTE — Patient Instructions (Signed)
Samuel Chen, it was a pleasure seeing you today!  Today we discussed: - I have re-filled your medications. I will call you with the results of the urine test.  I have ordered the following labs today:  Lab Orders         Microalbumin / Creatinine Urine Ratio     Follow-up: 3 months   Please make sure to arrive 15 minutes prior to your next appointment. If you arrive late, you may be asked to reschedule.   We look forward to seeing you next time. Please call our clinic at [phone] if you have any questions or concerns. The best time to call is Monday-Friday from 9am-4pm, but there is someone available 24/7. If after hours or the weekend, call the main hospital number and ask for the Internal Medicine Resident On-Call. If you need medication refills, please notify your pharmacy one week in advance and they will send [place] a request.  Thank you for letting us take part in your care. Wishing you the best!  Thank you, Evlyn Kanner, MD

## 2022-07-19 LAB — MICROALBUMIN / CREATININE URINE RATIO
Creatinine, Urine: 84.4 mg/dL
Microalb/Creat Ratio: 16 mg/g creat (ref 0–29)
Microalbumin, Urine: 13.1 ug/mL

## 2022-07-21 DIAGNOSIS — R051 Acute cough: Secondary | ICD-10-CM | POA: Insufficient documentation

## 2022-07-21 NOTE — Assessment & Plan Note (Signed)
BP Readings from Last 3 Encounters:  07/18/22 (!) 148/64  06/04/22 124/66  04/29/22 (!) 156/79   Blood pressure elevated today with systolics in 140's. He has been doing well with his medications, although he is in need of a refill. We will plant to continue with his current regimen today. If still persistently elevated at next visit would consider addition of thiazide diuretic.  - Continue amlodipine 10mg  daily - Continue valsartan 160mg  daily - Continue atenolol 50mg  daily - Consider addition of thiazide at next visit if persistently elevated - BMP at next visit

## 2022-07-21 NOTE — Assessment & Plan Note (Signed)
Samuel Chen reports cough for the last two weeks since he had COVID-19. Does report mild production of clear sputum since onset that has not changed. He denies any dyspnea, fevers, chills, post-nasal drip, watery eyes.   On examination, his lungs sound clear with good air movement and no wheezing. Discussed with Samuel Chen that this cough is a postinfectious cough from recent COVID-19 infection and could take a few weeks to recover. I do not believe he is in a COPD exacerbation and do not think systemic steroids would be appropriate at this time. Discussed with Samuel Chen to return to clinic if his cough worsens or develops dyspnea as well.  - Supportive care - Return precautions given

## 2022-07-21 NOTE — Progress Notes (Signed)
CC: follow-up  HPI:  [name] is a 66 y.o. person with medical history as below presenting to [facility] for follow-up.  Please see problem-based list for further details, assessments, and plans.  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   Dyshidrotic eczema    GERD (gastroesophageal reflux disease)    Hyperlipidemia    Hypertension    Myalgia 11/2007   Likely 2/2 Pravachol   Tobacco abuse    Review of Systems:  As per HPI  Physical Exam:  Vitals:   07/18/22 1328 07/18/22 1339  BP: (!) 143/73 (!) 148/64  Pulse: 73 73  Temp: (!) 97.1 F (36.2 C)   TempSrc: Oral   SpO2: 97%   Weight: 174 lb 6.4 oz (79.1 kg)   Height: 5\' 4"  (1.626 m)    General: Resting comfortably in no acute distress CV: Regular rate, rhythm. No murmurs appreciated. Distal pulses 2+ bilaterally Pulm: Normal work of breathing on room air. Clear to auscultation bilaterally.  MSK: Normal bulk, tone. No peripheral edema noted. Skin: Warm, dry. No rashes or lesions noted. Neuro: Awake, alert, conversing appropriately.  Psych: Normal mood, affect, speech.   Assessment & Plan:   Essential hypertension BP Readings from Last 3 Encounters:  07/18/22 (!) 148/64  06/04/22 124/66  04/29/22 (!) 156/79   Blood pressure elevated today with systolics in 140's. He has been doing well with his medications, although he is in need of a refill. We will plant to continue with his current regimen today. If still persistently elevated at next visit would consider addition of thiazide diuretic.  - Continue amlodipine 10mg  daily - Continue valsartan 160mg  daily - Continue atenolol 50mg  daily - Consider addition of thiazide at next visit if persistently elevated - BMP at next visit  COPD (chronic obstructive pulmonary disease)[name] reports he has had  mild persistent cough since he had COVID a few weeks ago, but otherwise states he is doing well, denies dyspnea, audible wheezing. Samuel Chen and I did discuss today Symbicort vs Breztri, as he feels he wasn't getting any additional benefit from the [place]. After discussion we will continue with .   Samuel Chen 2 puffs two times daily - Albuterol as needed - Follow-up with pulmonology 12/2022  Diabetes [facility]) Patient has been doing well with metformin, denies any new symptoms of polyuria, polydipsia, vision changes, or neuropathy. His A1c has been chronically around 6-6.5%, will have him return in a few months to re-check this. Last lipid panel 2021 with LDL 59, can likely defer this for a few more years with ongoing use of atorvastatin.  - Continue metformin 500mg  twice daily - Continue atorvastatin 40mg  daily - Urine microalbumin/creatinine ratio today - Foot exam today - Follow-up A1c, BMP in three months  Acute cough Samuel Chen reports cough for the last two weeks since he had COVID-19. Does report mild production of clear sputum since onset that has not changed. He denies any dyspnea, fevers, chills, post-nasal drip, watery eyes.   On examination, his lungs sound clear with good air movement and no wheezing. Discussed with Samuel Chen that this cough is a postinfectious cough from recent COVID-19 infection and could take a few weeks to recover. I do not believe he is in a COPD exacerbation and do not think systemic steroids would be  appropriate at this time. Discussed with Samuel Chen to return to clinic if his cough worsens or develops dyspnea as well.  - Supportive care - Return precautions given   Patient discussed with Dr.  Evelena Leyden, MD Internal Medicine PGY-3 Pager: [phone]

## 2022-07-21 NOTE — Assessment & Plan Note (Signed)
Samuel Chen reports he has had mild persistent cough since he had COVID a few weeks ago, but otherwise states he is doing well, denies dyspnea, audible wheezing. Mr. Kropp and I did discuss today Symbicort vs Breztri, as he feels he wasn't getting any additional benefit from the [place]. After discussion we will continue with Markus Daft.   Markus Daft 2 puffs two times daily - Albuterol as needed - Follow-up with pulmonology 12/2022

## 2022-07-21 NOTE — Assessment & Plan Note (Signed)
Patient has been doing well with metformin, denies any new symptoms of polyuria, polydipsia, vision changes, or neuropathy. His A1c has been chronically around 6-6.5%, will have him return in a few months to re-check this. Last lipid panel 2021 with LDL 59, can likely defer this for a few more years with ongoing use of atorvastatin.  - Continue metformin 500mg  twice daily - Continue atorvastatin 40mg  daily - Urine microalbumin/creatinine ratio today - Foot exam today - Follow-up A1c, BMP in three months

## 2022-07-26 ENCOUNTER — Other Ambulatory Visit: Payer: Self-pay | Admitting: Student

## 2022-07-26 DIAGNOSIS — I10 Essential (primary) hypertension: Secondary | ICD-10-CM

## 2022-07-26 DIAGNOSIS — E785 Hyperlipidemia, unspecified: Secondary | ICD-10-CM

## 2022-07-29 ENCOUNTER — Other Ambulatory Visit: Payer: Self-pay | Admitting: Student

## 2022-07-29 DIAGNOSIS — E785 Hyperlipidemia, unspecified: Secondary | ICD-10-CM

## 2022-07-29 DIAGNOSIS — J329 Chronic sinusitis, unspecified: Secondary | ICD-10-CM

## 2022-07-30 ENCOUNTER — Other Ambulatory Visit: Payer: Self-pay | Admitting: Student

## 2022-07-30 DIAGNOSIS — J329 Chronic sinusitis, unspecified: Secondary | ICD-10-CM

## 2022-07-30 NOTE — Progress Notes (Signed)
Internal Medicine Clinic Attending ? ?Case discussed with Dr. Braswell  At the time of the visit.  We reviewed the resident?s history and exam and pertinent patient test results.  I agree with the assessment, diagnosis, and plan of care documented in the resident?s note.  ?

## 2022-07-30 NOTE — Telephone Encounter (Signed)
Next appt scheduled 10/17/22 with PCP. 

## 2022-08-22 ENCOUNTER — Other Ambulatory Visit: Payer: Self-pay | Admitting: Internal Medicine

## 2022-08-22 DIAGNOSIS — L301 Dyshidrosis [pompholyx]: Secondary | ICD-10-CM

## 2022-08-28 ENCOUNTER — Other Ambulatory Visit: Payer: Self-pay | Admitting: Internal Medicine

## 2022-08-28 DIAGNOSIS — I10 Essential (primary) hypertension: Secondary | ICD-10-CM

## 2022-08-28 NOTE — Telephone Encounter (Signed)
Next appt scheduled 10/17/22 with PCP. 

## 2022-09-11 ENCOUNTER — Other Ambulatory Visit: Payer: Self-pay | Admitting: Internal Medicine

## 2022-09-11 DIAGNOSIS — M7062 Trochanteric bursitis, left hip: Secondary | ICD-10-CM

## 2022-09-11 NOTE — Telephone Encounter (Signed)
Next appt scheduled 10/17/22 with PCP. 

## 2022-10-17 ENCOUNTER — Encounter: Payer: Self-pay | Admitting: Student

## 2022-10-17 ENCOUNTER — Ambulatory Visit: Payer: 59 | Admitting: Student

## 2022-10-17 VITALS — BP 154/74 | HR 66 | Temp 98.1°F | Wt 171.2 lb

## 2022-10-17 DIAGNOSIS — M7062 Trochanteric bursitis, left hip: Secondary | ICD-10-CM | POA: Diagnosis not present

## 2022-10-17 DIAGNOSIS — I10 Essential (primary) hypertension: Secondary | ICD-10-CM

## 2022-10-17 DIAGNOSIS — E119 Type 2 diabetes mellitus without complications: Secondary | ICD-10-CM | POA: Diagnosis not present

## 2022-10-17 DIAGNOSIS — Z0000 Encounter for general adult medical examination without abnormal findings: Secondary | ICD-10-CM

## 2022-10-17 DIAGNOSIS — Z1211 Encounter for screening for malignant neoplasm of colon: Secondary | ICD-10-CM

## 2022-10-17 DIAGNOSIS — K5900 Constipation, unspecified: Secondary | ICD-10-CM

## 2022-10-17 DIAGNOSIS — R252 Cramp and spasm: Secondary | ICD-10-CM

## 2022-10-17 DIAGNOSIS — J449 Chronic obstructive pulmonary disease, unspecified: Secondary | ICD-10-CM | POA: Diagnosis not present

## 2022-10-17 DIAGNOSIS — J309 Allergic rhinitis, unspecified: Secondary | ICD-10-CM

## 2022-10-17 DIAGNOSIS — J329 Chronic sinusitis, unspecified: Secondary | ICD-10-CM

## 2022-10-17 DIAGNOSIS — Z87891 Personal history of nicotine dependence: Secondary | ICD-10-CM

## 2022-10-17 DIAGNOSIS — E785 Hyperlipidemia, unspecified: Secondary | ICD-10-CM

## 2022-10-17 DIAGNOSIS — Z7984 Long term (current) use of oral hypoglycemic drugs: Secondary | ICD-10-CM

## 2022-10-17 DIAGNOSIS — E559 Vitamin D deficiency, unspecified: Secondary | ICD-10-CM

## 2022-10-17 DIAGNOSIS — R7303 Prediabetes: Secondary | ICD-10-CM

## 2022-10-17 DIAGNOSIS — Z8639 Personal history of other endocrine, nutritional and metabolic disease: Secondary | ICD-10-CM

## 2022-10-17 LAB — POCT GLYCOSYLATED HEMOGLOBIN (HGB A1C): Hemoglobin A1C: 6.2 % — AB (ref 4.0–5.6)

## 2022-10-17 LAB — GLUCOSE, CAPILLARY: Glucose-Capillary: 130 mg/dL — ABNORMAL HIGH (ref 70–99)

## 2022-10-17 MED ORDER — ATENOLOL 50 MG PO TABS: 50.0000 mg | ORAL_TABLET | Freq: Every day | ORAL | 2 refills | Status: DC

## 2022-10-17 MED ORDER — FLUTICASONE PROPIONATE 50 MCG/ACT NA SUSP: NASAL | 2 refills | Status: DC

## 2022-10-17 MED ORDER — SENNOSIDES 8.6 MG PO TABS: 1.0000 | ORAL_TABLET | Freq: Every day | ORAL | 2 refills | Status: AC

## 2022-10-17 MED ORDER — VALSARTAN 160 MG PO TABS: 160.0000 mg | ORAL_TABLET | Freq: Every day | ORAL | 0 refills | Status: DC

## 2022-10-17 MED ORDER — HYDROCHLOROTHIAZIDE 12.5 MG PO TABS: 12.5000 mg | ORAL_TABLET | Freq: Every day | ORAL | 0 refills | Status: DC

## 2022-10-17 MED ORDER — ALBUTEROL SULFATE HFA 108 (90 BASE) MCG/ACT IN AERS: 2.0000 | INHALATION_SPRAY | Freq: Four times a day (QID) | RESPIRATORY_TRACT | 3 refills | Status: DC | PRN

## 2022-10-17 MED ORDER — GABAPENTIN 100 MG PO CAPS: 100.0000 mg | ORAL_CAPSULE | Freq: Every day | ORAL | 2 refills | Status: DC

## 2022-10-17 MED ORDER — ATORVASTATIN CALCIUM 40 MG PO TABS: ORAL_TABLET | ORAL | 2 refills | Status: DC

## 2022-10-17 MED ORDER — METFORMIN HCL 500 MG PO TABS: 500.0000 mg | ORAL_TABLET | Freq: Two times a day (BID) | ORAL | 5 refills | Status: DC

## 2022-10-17 MED ORDER — AMLODIPINE BESYLATE 10 MG PO TABS: 10.0000 mg | ORAL_TABLET | Freq: Every day | ORAL | 2 refills | Status: DC

## 2022-10-17 MED ORDER — MELOXICAM 15 MG PO TABS: ORAL_TABLET | ORAL | 2 refills | Status: DC

## 2022-10-17 MED ORDER — BUDESONIDE-FORMOTEROL FUMARATE 80-4.5 MCG/ACT IN AERO: 2.0000 | INHALATION_SPRAY | Freq: Two times a day (BID) | RESPIRATORY_TRACT | 12 refills | Status: DC

## 2022-10-17 MED ORDER — CETIRIZINE HCL 10 MG PO TABS: 10.0000 mg | ORAL_TABLET | Freq: Every day | ORAL | 2 refills | Status: DC

## 2022-10-17 NOTE — Progress Notes (Unsigned)
   CC: ***  HPI:  [name] is a 67 y.o. person with *** presenting to [facility] for ***  Please see problem-based list for further details, assessments, and plans.  Past Medical History:  Diagnosis Date   Alcohol abuse    With presumed ETOH hepatitis   Allergic rhinitis    COPD (chronic obstructive pulmonary disease) ([place])    Questionable diagnosis.   Dyshidrotic eczema    GERD (gastroesophageal reflux disease)    Hyperlipidemia    Hypertension    Myalgia 11/2007   Likely 2/2 Pravachol   Tobacco abuse    Review of Systems:  As per HPI  Physical Exam:  Vitals:   10/17/22 1336  BP: (!) 144/71  Pulse: 70  Temp: 98.1 F (36.7 C)  TempSrc: Oral  SpO2: 99%  Weight: 171 lb 3.2 oz (77.7 kg)   General: *** HENT: *** Eyes: *** CV: *** Pulm: *** GI: *** MSK: *** Skin: *** Neuro: *** Psych: ***  Assessment & Plan:   No problem-specific Assessment & Plan notes found for this encounter.   Patient {GC/GE:3044014::"discussed with","seen with"} Dr. {NAMES:[id number]::"Butcher","Guilloud","Hoffman","Mullen","Narendra","Raines","Vincent"}  Sanjuan Dame, MD Internal Medicine PGY-3 Pager: [phone]

## 2022-10-17 NOTE — Patient Instructions (Signed)
Samuel Chen, it was a pleasure seeing you today!  Today we discussed: - I have re-filled your medications and started you on hydrochlorothiazide. I have sent in 90d supply for everything except your valsartan and hydrochlorothiazide. When you return in one month, if your blood pressure is at goal we can combine these into one pill.  - I will call you with the results of your lab work.  - You should be called to schedule your CT scan and colonoscopy.   I have ordered the following labs today:   Lab Orders         Glucose, capillary         Vitamin D (25 hydroxy)         BMP8+Anion Gap         POC Hbg A1C      Referrals ordered today:    Referral Orders         Ambulatory referral to Gastroenterology      I have ordered the following medication/changed the following medications:   Start the following medications: Meds ordered this encounter  Medications   hydrochlorothiazide (HYDRODIURIL) 12.5 MG tablet    Sig: Take 1 tablet (12.5 mg total) by mouth daily.    Dispense:  30 tablet    Refill:  0   albuterol (PROVENTIL HFA) 108 (90 Base) MCG/ACT inhaler    Sig: Inhale 2 puffs into the lungs every 6 (six) hours as needed for wheezing or shortness of breath.    Dispense:  18 g    Refill:  3   amLODipine (NORVASC) 10 MG tablet    Sig: Take 1 tablet (10 mg total) by mouth daily.    Dispense:  90 tablet    Refill:  2   atenolol (TENORMIN) 50 MG tablet    Sig: Take 1 tablet (50 mg total) by mouth daily.    Dispense:  90 tablet    Refill:  2   atorvastatin (LIPITOR) 40 MG tablet    Sig: Take one by mouth once daily    Dispense:  90 tablet    Refill:  2   budesonide-formoterol (SYMBICORT) 80-4.5 MCG/ACT inhaler    Sig: Inhale 2 puffs into the lungs in the morning and at bedtime.    Dispense:  1 each    Refill:  12   cetirizine (ZYRTEC) 10 MG tablet    Sig: Take 1 tablet (10 mg total) by mouth daily.    Dispense:  90 tablet    Refill:  2   fluticasone (FLONASE) 50  MCG/ACT nasal spray    Sig: Use 2 spray(s) in each nostril once daily    Dispense:  16 g    Refill:  2   gabapentin (NEURONTIN) 100 MG capsule    Sig: Take 1 capsule (100 mg total) by mouth at bedtime.    Dispense:  90 capsule    Refill:  2   meloxicam (MOBIC) 15 MG tablet    Sig: Take one tablet by mouth once daily as needed for pain    Dispense:  90 tablet    Refill:  2   metFORMIN (GLUCOPHAGE) 500 MG tablet    Sig: Take 1 tablet (500 mg total) by mouth 2 (two) times daily with a meal.    Dispense:  120 tablet    Refill:  5   senna (SENOKOT) 8.6 MG tablet    Sig: Take 1 tablet (8.6 mg total) by mouth daily.    Dispense:  90 tablet    Refill:  2   valsartan (DIOVAN) 160 MG tablet    Sig: Take 1 tablet (160 mg total) by mouth daily.    Dispense:  30 tablet    Refill:  0     Follow-up: 6 months   Please make sure to arrive 15 minutes prior to your next appointment. If you arrive late, you may be asked to reschedule.   We look forward to seeing you next time. Please call our clinic at [phone] if you have any questions or concerns. The best time to call is Monday-Friday from 9am-4pm, but there is someone available 24/7. If after hours or the weekend, call the main hospital number and ask for the Internal Medicine Resident On-Call. If you need medication refills, please notify your pharmacy one week in advance and they will send [place] a request.  Thank you for letting us take part in your care. Wishing you the best!  Thank you, Sanjuan Dame, MD

## 2022-10-18 LAB — BMP8+ANION GAP
Anion Gap: 17 mmol/L (ref 10.0–18.0)
BUN/Creatinine Ratio: 14 (ref 10–24)
BUN: 15 mg/dL (ref 8–27)
CO2: 19 mmol/L — ABNORMAL LOW (ref 20–29)
Calcium: 9.7 mg/dL (ref 8.6–10.2)
Chloride: 103 mmol/L (ref 96–106)
Creatinine, Ser: 1.08 mg/dL (ref 0.76–1.27)
Glucose: 94 mg/dL (ref 70–99)
Potassium: 4.4 mmol/L (ref 3.5–5.2)
Sodium: 139 mmol/L (ref 134–144)
eGFR: 77 mL/min/{1.73_m2} (ref >59)

## 2022-10-18 LAB — VITAMIN D 25 HYDROXY (VIT D DEFICIENCY, FRACTURES): Vit D, 25-Hydroxy: 43.3 ng/mL (ref 30.0–100.0)

## 2022-10-18 MED ORDER — BUDESONIDE-FORMOTEROL FUMARATE 160-4.5 MCG/ACT IN AERO: 2.0000 | INHALATION_SPRAY | Freq: Two times a day (BID) | RESPIRATORY_TRACT | 12 refills | Status: DC

## 2022-10-20 DIAGNOSIS — Z8639 Personal history of other endocrine, nutritional and metabolic disease: Secondary | ICD-10-CM | POA: Insufficient documentation

## 2022-10-20 MED ORDER — CETIRIZINE HCL 10 MG PO TABS: 10.0000 mg | ORAL_TABLET | Freq: Every day | ORAL | 2 refills | Status: DC

## 2022-10-20 NOTE — Assessment & Plan Note (Signed)
Long standing issue, symptoms typically worse in winter/spring. Will continue with current regimen.  - Daily cetirizine - Daily Flonase - Nasal irrigation as needed

## 2022-10-20 NOTE — Assessment & Plan Note (Addendum)
Patient reports he has not been taking Breztri, but has been taking Symbicort instead. He feels as though his symptoms have been well-controlled with this. Has not been hospitalized previously for COPD exacerbation. Denies dyspnea, wheezing, persistent coughing. We will continue on Symbicort for now, but would have low threshold to add LAMA to regimen if he has any persistent symptoms.  - Continue Symbicort twice daily - Albuterol as needed - Add LAMA if has persistent symptoms

## 2022-10-20 NOTE — Assessment & Plan Note (Signed)
Patient has chronic trochanteric bursitis which is well-controlled with meloxicam. He does not take this everyday, but typically when his symptoms are bad. Plan to closely monitor renal function, continue with medication.   - Meloxicam 49m daily as needed - BMP today

## 2022-10-20 NOTE — Assessment & Plan Note (Addendum)
A1c 6.2% from 6.5% at last visit. He remains asymptomatic, no issues with his medication regimen. He did recently have ophthalmologic exam, we will request records to see if he has any retinopathy. Patient does report mild neuropathy that is controlled with nightly gabapentin. He is otherwise up-to-date on his annual screenings. Overall well-controlled, would repeat A1c in 6 mo.  - Metformin 558m twice daily - Atorvastatin 467mdaily - Gabapentin 10074mHS - Follow-up ophthalmology records - A1c in 13mo37moMP today

## 2022-10-20 NOTE — Assessment & Plan Note (Signed)
Upon chart review patient was previously found to have vitamin D deficiency. He is currently taking supplements, we will re-check this today.  - Vitamin D level today

## 2022-10-20 NOTE — Assessment & Plan Note (Signed)
BP Readings from Last 3 Encounters:  10/17/22 (!) 154/74  07/18/22 (!) 148/64  06/04/22 124/66   Blood pressure continues to be mildly elevated, as it was during the visit in November. Samuel Chen reports compliance with his medications, including this morning. He is tolerating them well today. Discussed addition of additional agent today. Plan to add hydrochlorothiazide today and have him follow-up in one month. At next visit, if his BP is controlled would send in refill for valsartan/HCTZ as combination pill to help decrease pill burden.  - Start hydrochlorothiazide 12.34m daily - Valsartan 1643mdaily - Amlodipine 1089maily - Atenolol 54m40mily - Return to clinic in one month for BP follow-up - BMP today

## 2022-10-20 NOTE — Assessment & Plan Note (Signed)
-   Colonoscopy referral - Repeat CT lung cancer screening  - Records request for recent eye exam

## 2022-10-21 ENCOUNTER — Encounter: Payer: Self-pay | Admitting: Student

## 2022-10-22 NOTE — Progress Notes (Signed)
Internal Medicine Clinic Attending  Case discussed with Dr. Collene Gobble  At the time of the visit.  We reviewed the resident's history and exam and pertinent patient test results.  I agree with the assessment, diagnosis, and plan of care documented in the resident's note.  On chart review, Samuel Chen was previously seen by Dr. Ernst Bowler for treatment of moderate persistent asthma. No evidence of formal PFTs to diagnose obstructive lung disease, COPD appears to have been carried forward on the chart. Thankfully he has been doing well on Symbicort, although would recommend LAMA therapy if he in fact does have COPD. I would recommend referral for PFTs at next appointment to confirm underlying lung disease to guide appropriate therapy.

## 2022-10-31 ENCOUNTER — Encounter: Payer: Self-pay | Admitting: Dietician

## 2022-11-20 ENCOUNTER — Ambulatory Visit: Payer: 59 | Admitting: Student

## 2022-11-20 VITALS — BP 135/58 | HR 66 | Temp 97.7°F | Ht 64.0 in | Wt 175.3 lb

## 2022-11-20 DIAGNOSIS — L301 Dyshidrosis [pompholyx]: Secondary | ICD-10-CM

## 2022-11-20 DIAGNOSIS — Z7984 Long term (current) use of oral hypoglycemic drugs: Secondary | ICD-10-CM

## 2022-11-20 DIAGNOSIS — E785 Hyperlipidemia, unspecified: Secondary | ICD-10-CM

## 2022-11-20 DIAGNOSIS — J329 Chronic sinusitis, unspecified: Secondary | ICD-10-CM | POA: Diagnosis not present

## 2022-11-20 DIAGNOSIS — J449 Chronic obstructive pulmonary disease, unspecified: Secondary | ICD-10-CM | POA: Diagnosis not present

## 2022-11-20 DIAGNOSIS — R252 Cramp and spasm: Secondary | ICD-10-CM

## 2022-11-20 DIAGNOSIS — I10 Essential (primary) hypertension: Secondary | ICD-10-CM

## 2022-11-20 DIAGNOSIS — E119 Type 2 diabetes mellitus without complications: Secondary | ICD-10-CM

## 2022-11-20 DIAGNOSIS — Z0000 Encounter for general adult medical examination without abnormal findings: Secondary | ICD-10-CM

## 2022-11-20 DIAGNOSIS — R7303 Prediabetes: Secondary | ICD-10-CM

## 2022-11-20 MED ORDER — GABAPENTIN 100 MG PO CAPS: 100.0000 mg | ORAL_CAPSULE | Freq: Every day | ORAL | 2 refills | Status: DC

## 2022-11-20 MED ORDER — ATORVASTATIN CALCIUM 40 MG PO TABS: ORAL_TABLET | ORAL | 2 refills | Status: DC

## 2022-11-20 MED ORDER — CLOBETASOL PROPIONATE 0.05 % EX CREA: TOPICAL_CREAM | CUTANEOUS | 0 refills | Status: DC

## 2022-11-20 MED ORDER — AMLODIPINE BESYLATE 10 MG PO TABS: 10.0000 mg | ORAL_TABLET | Freq: Every day | ORAL | 2 refills | Status: DC

## 2022-11-20 MED ORDER — VALSARTAN 160 MG PO TABS: 160.0000 mg | ORAL_TABLET | Freq: Every day | ORAL | 0 refills | Status: DC

## 2022-11-20 MED ORDER — HYDROCHLOROTHIAZIDE 12.5 MG PO TABS: 12.5000 mg | ORAL_TABLET | Freq: Every day | ORAL | 0 refills | Status: DC

## 2022-11-20 MED ORDER — CETIRIZINE HCL 10 MG PO TABS: 10.0000 mg | ORAL_TABLET | Freq: Every day | ORAL | 2 refills | Status: DC

## 2022-11-20 MED ORDER — CHOLECALCIFEROL 25 MCG (1000 UT) PO TABS: 1000.0000 [IU] | ORAL_TABLET | Freq: Every day | ORAL | 2 refills | Status: AC

## 2022-11-20 MED ORDER — METFORMIN HCL 500 MG PO TABS: 500.0000 mg | ORAL_TABLET | Freq: Two times a day (BID) | ORAL | 5 refills | Status: DC

## 2022-11-20 MED ORDER — FLUTICASONE PROPIONATE 50 MCG/ACT NA SUSP: NASAL | 2 refills | Status: DC

## 2022-11-20 MED ORDER — ALBUTEROL SULFATE HFA 108 (90 BASE) MCG/ACT IN AERS: 2.0000 | INHALATION_SPRAY | Freq: Four times a day (QID) | RESPIRATORY_TRACT | 3 refills | Status: DC | PRN

## 2022-11-20 MED ORDER — BUDESONIDE-FORMOTEROL FUMARATE 160-4.5 MCG/ACT IN AERO: 2.0000 | INHALATION_SPRAY | Freq: Two times a day (BID) | RESPIRATORY_TRACT | 12 refills | Status: DC

## 2022-11-20 NOTE — Patient Instructions (Signed)
Thank you, [name] for allowing [place] to provide your care today. Today we discussed .  High blood pressure Please stop taking your atenlolol. Continue your hydrochlorothiazide, amlodipine, and valsartan. IF you notice you are still having dizziness/lightheadedness, pleas call us and we can further adjust your medicines  COPD Please continue your inhalers. If you notice more frequent shortness of breath or wheezing, please call our clinic.   Diabetes Continue your metformin twice daily  Colon cancer screening Please consider a colonoscopy. You can also do a home kit that you would do yearly rather than every 10 years if you would like    I have ordered the following labs for you:  Lab Orders  No laboratory test(s) ordered today      Referrals ordered today:   Referral Orders  No referral(s) requested today     I have ordered the following medication/changed the following medications:   Stop the following medications: Medications Discontinued During This Encounter  Medication Reason   Spacer/Aero-Holding Chambers (AEROCHAMBER MV) inhaler    Cholecalciferol 25 MCG (1000 UT) tablet Reorder   clobetasol cream (TEMOVATE) 0.05 % Reorder   hydrochlorothiazide (HYDRODIURIL) 12.5 MG tablet Reorder   albuterol (PROVENTIL HFA) 108 (90 Base) MCG/ACT inhaler Reorder   amLODipine (NORVASC) 10 MG tablet Reorder   atorvastatin (LIPITOR) 40 MG tablet Reorder   fluticasone (FLONASE) 50 MCG/ACT nasal spray Reorder   gabapentin (NEURONTIN) 100 MG capsule Reorder   metFORMIN (GLUCOPHAGE) 500 MG tablet Reorder   valsartan (DIOVAN) 160 MG tablet Reorder   budesonide-formoterol (SYMBICORT) 160-4.5 MCG/ACT inhaler Reorder   cetirizine (ZYRTEC) 10 MG tablet Reorder   atenolol (TENORMIN) 50 MG tablet      Start the following medications: Meds ordered this encounter  Medications   albuterol (PROVENTIL HFA) 108 (90 Base) MCG/ACT inhaler    Sig: Inhale 2 puffs into the lungs every 6  (six) hours as needed for wheezing or shortness of breath.    Dispense:  18 g    Refill:  3   amLODipine (NORVASC) 10 MG tablet    Sig: Take 1 tablet (10 mg total) by mouth daily.    Dispense:  90 tablet    Refill:  2   atorvastatin (LIPITOR) 40 MG tablet    Sig: Take one by mouth once daily    Dispense:  90 tablet    Refill:  2   budesonide-formoterol (SYMBICORT) 160-4.5 MCG/ACT inhaler    Sig: Inhale 2 puffs into the lungs 2 (two) times daily.    Dispense:  1 each    Refill:  12   cetirizine (ZYRTEC) 10 MG tablet    Sig: Take 1 tablet (10 mg total) by mouth daily.    Dispense:  90 tablet    Refill:  2   Cholecalciferol 25 MCG (1000 UT) tablet    Sig: Take 1 tablet (1,000 Units total) by mouth daily.    Dispense:  30 tablet    Refill:  2   clobetasol cream (TEMOVATE) 0.05 %    Sig: APPLY 1 APPLICATION TOPICALLY TO ITCHY AREA TWICE DAILY    Dispense:  15 g    Refill:  0   fluticasone (FLONASE) 50 MCG/ACT nasal spray    Sig: Use 2 spray(s) in each nostril once daily    Dispense:  16 g    Refill:  2   gabapentin (NEURONTIN) 100 MG capsule    Sig: Take 1 capsule (100 mg total) by mouth at bedtime.  Dispense:  90 capsule    Refill:  2   hydrochlorothiazide (HYDRODIURIL) 12.5 MG tablet    Sig: Take 1 tablet (12.5 mg total) by mouth daily.    Dispense:  30 tablet    Refill:  0   metFORMIN (GLUCOPHAGE) 500 MG tablet    Sig: Take 1 tablet (500 mg total) by mouth 2 (two) times daily with a meal.    Dispense:  120 tablet    Refill:  5   valsartan (DIOVAN) 160 MG tablet    Sig: Take 1 tablet (160 mg total) by mouth daily.    Dispense:  30 tablet    Refill:  0     Follow up: 2-3 months   Should you have any questions or concerns please call the internal medicine clinic at [phone].    Sanjuana Letters, D.O. [place]

## 2022-11-21 ENCOUNTER — Ambulatory Visit (HOSPITAL_COMMUNITY): Payer: 59

## 2022-11-21 NOTE — Progress Notes (Signed)
   CC: hypertension follow up  HPI:  [name] is a 67 y.o. male living with a history stated below and presents today for follow up of his hypertension. Please see problem based assessment and plan for additional details.  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   Dyshidrotic eczema    GERD (gastroesophageal reflux disease)    Hyperlipidemia    Hypertension    Myalgia 11/2007   Likely 2/2 Pravachol   Tobacco abuse     Current Outpatient Medications on File Prior to Visit  Medication Sig Dispense Refill   Dextromethorphan HBr 10 MG/5ML LIQD Take 5 mLs (10 mg total) by mouth every 6 (six) hours as needed. 118 mL 0   meloxicam (MOBIC) 15 MG tablet Take one tablet by mouth once daily as needed for pain 90 tablet 2   senna (SENOKOT) 8.6 MG tablet Take 1 tablet (8.6 mg total) by mouth daily. 90 tablet 2   No current facility-administered medications on file prior to visit.    Review of Systems: ROS negative except for what is noted on the assessment and plan.  Vitals:   11/20/22 1353  BP: (!) 135/58  Pulse: 66  Temp: 97.7 F (36.5 C)  TempSrc: Oral  SpO2: 98%  Weight: 175 lb 4.8 oz (79.5 kg)  Height: 5\' 4"  (1.626 m)    Physical Exam: Constitutional:in no acute distress HENT: normocephalic atraumatic Eyes: conjunctiva non-erythematous Neck: supple Cardiovascular: regular rate and rhythm, no m/r/g Pulmonary/Chest: normal work of breathing on room air MSK: normal bulk and tone Neurological: alert Skin: warm and dry Psych: normal mood  Assessment & Plan:   Essential hypertension Assessment: Current regimen of valsartan 160 mg daily, HCTZ 12.5 mg daily, amlodipine 10 mg daily and atenolol 50 mg daily. He notes frequent episodes of lightheadedness and dizziness at home. These occur daily and mostly in the  morning. Likely orthostatic episodes. BP today with wide pressure different of 135/58 and a HR in the 60's. With this, will discontinue his atenolol, no history of MI or HFrEF. Once on stable regimen and BP at goal would consider combination therapy to decrease pill burden. He has not taken his HCTZ for the past three days as he has been out. Will not check BMP today. He would rather not return in 1 month so will delay repeating BMP until 2 month follow up per his request  Plan: - Continue HCTZ 12.5 daily, valsartan 160 mg daily, and amloidpine 10 mg daily - Discontinue atenolol - Encouraged home measurements and recordings of his BP - BMP at follow up  COPD (chronic obstructive pulmonary disease)[facility] Assessment: Symptoms well controlled on symbicort and albuterol PRN  Plan: - continue regimen as per above  Health care maintenance Assessment: Discussed need for colonoscopy for colon cancer screening. He has the number and will call to schedule  Plan: - follow up to see when this is completed  Diabetes [facility]) Assessment: A1c of 6.2% last month. Will need repeat A1c in 2 months.   Plan: - continue metformin 500 mg BID  Patient discussed with Dr. Butch Penny, D.O. [facility], PGY-3 Phone: [phone] Date 11/21/2022 Time 8:29 AM

## 2022-11-21 NOTE — Assessment & Plan Note (Signed)
Assessment: Discussed need for colonoscopy for colon cancer screening. He has the number and will call to schedule  Plan: - follow up to see when this is completed

## 2022-11-21 NOTE — Assessment & Plan Note (Signed)
Assessment: Current regimen of valsartan 160 mg daily, HCTZ 12.5 mg daily, amlodipine 10 mg daily and atenolol 50 mg daily. He notes frequent episodes of lightheadedness and dizziness at home. These occur daily and mostly in the morning. Likely orthostatic episodes. BP today with wide pressure different of 135/58 and a HR in the 60's. With this, will discontinue his atenolol, no history of MI or HFrEF. Once on stable regimen and BP at goal would consider combination therapy to decrease pill burden. He has not taken his HCTZ for the past three days as he has been out. Will not check BMP today. He would rather not return in 1 month so will delay repeating BMP until 2 month follow up per his request  Plan: - Continue HCTZ 12.5 daily, valsartan 160 mg daily, and amloidpine 10 mg daily - Discontinue atenolol - Encouraged home measurements and recordings of his BP - BMP at follow up

## 2022-11-21 NOTE — Assessment & Plan Note (Signed)
Assessment: A1c of 6.2% last month. Will need repeat A1c in 2 months.   Plan: - continue metformin 500 mg BID

## 2022-11-21 NOTE — Assessment & Plan Note (Signed)
Assessment: Symptoms well controlled on symbicort and albuterol PRN  Plan: - continue regimen as per above

## 2022-11-22 NOTE — Progress Notes (Signed)
Internal Medicine Clinic Attending  Case discussed with Dr. Katsadouros  At the time of the visit.  We reviewed the resident's history and exam and pertinent patient test results.  I agree with the assessment, diagnosis, and plan of care documented in the resident's note.  

## 2022-11-22 NOTE — Addendum Note (Signed)
Addended by: Charise Killian on: 11/22/2022 09:01 AM   Modules accepted: Level of Service

## 2022-12-16 ENCOUNTER — Other Ambulatory Visit: Payer: Self-pay | Admitting: Student

## 2022-12-16 DIAGNOSIS — I10 Essential (primary) hypertension: Secondary | ICD-10-CM

## 2022-12-18 ENCOUNTER — Encounter: Payer: Self-pay | Admitting: Internal Medicine

## 2023-01-14 ENCOUNTER — Other Ambulatory Visit: Payer: Self-pay | Admitting: Student

## 2023-01-14 DIAGNOSIS — I10 Essential (primary) hypertension: Secondary | ICD-10-CM

## 2023-01-22 ENCOUNTER — Other Ambulatory Visit: Payer: Self-pay

## 2023-01-22 ENCOUNTER — Encounter: Payer: Self-pay | Admitting: Student

## 2023-01-22 ENCOUNTER — Ambulatory Visit: Payer: 59 | Admitting: Student

## 2023-01-22 VITALS — BP 128/58 | HR 74 | Temp 98.3°F | Ht 64.0 in | Wt 171.6 lb

## 2023-01-22 DIAGNOSIS — Z7984 Long term (current) use of oral hypoglycemic drugs: Secondary | ICD-10-CM

## 2023-01-22 DIAGNOSIS — I10 Essential (primary) hypertension: Secondary | ICD-10-CM

## 2023-01-22 DIAGNOSIS — E785 Hyperlipidemia, unspecified: Secondary | ICD-10-CM

## 2023-01-22 DIAGNOSIS — E1169 Type 2 diabetes mellitus with other specified complication: Secondary | ICD-10-CM

## 2023-01-22 DIAGNOSIS — R040 Epistaxis: Secondary | ICD-10-CM | POA: Diagnosis not present

## 2023-01-22 DIAGNOSIS — E119 Type 2 diabetes mellitus without complications: Secondary | ICD-10-CM

## 2023-01-22 MED ORDER — VALSARTAN 160 MG PO TABS: 160.0000 mg | ORAL_TABLET | Freq: Every day | ORAL | 11 refills | Status: DC

## 2023-01-22 NOTE — Patient Instructions (Signed)
Samuel Chen,Thank you for allowing me to take part in your care today.  Here are your instructions.  1. For your nose bleeds, please try and use your flonase properly and aim towards the cheek not the middle. Please use a saline spray as well. Do not blow your nose too hard.  2. I will check labs, I will call you with the results.  3. Please come back in 3 months for follow up.   Thank you, Dr. Allena Katz  If you have any other questions please contact the internal medicine clinic at [phone]

## 2023-01-22 NOTE — Assessment & Plan Note (Addendum)
Patient currently on atorvastatin 40 mg daily.  He reports compliance with his medication.  He has no concerns for intolerance.  Plan: -Continue atorvastatin 40 mg daily -Lipid panel pending  Addendum: -Lipid panel within normal limits.  Triglycerides have come down well.  LDL at goal.  Total cholesterol at goal.  Continue current management with atorvastatin 40 mg daily.

## 2023-01-22 NOTE — Progress Notes (Signed)
CC: Follow-up appointment  HPI:  [name] is a 67 y.o. male with past medical history of hypertension, diabetes, COPD presents for 59-month follow-up.  Please see assessment and plan for full HPI.  COPD: Albuterol, Symbicort 2 puffs twice daily Hypertension: Amlodipine 10 mg daily, HCTZ 12.5 mg daily, valsartan 160 mg daily Hyperlipidemia.  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   Dyshidrotic eczema    GERD (gastroesophageal reflux disease)    Hyperlipidemia    Hypertension    Myalgia 11/2007   Likely 2/2 Pravachol   Tobacco abuse      Current Outpatient Medications:    albuterol (PROVENTIL HFA) 108 (90 Base) MCG/ACT inhaler, Inhale 2 puffs into the lungs every 6 (six) hours as needed for wheezing or shortness of breath., Disp: 18 g, Rfl: 3   amLODipine (NORVASC) 10 MG tablet, Take 1 tablet (10 mg total) by mouth daily., Disp: 90 tablet, Rfl: 2   atorvastatin (LIPITOR) 40 MG tablet, Take one by mouth once daily, Disp: 90 tablet, Rfl: 2   budesonide-formoterol (SYMBICORT) 160-4.5 MCG/ACT inhaler, Inhale 2 puffs into the lungs 2 (two) times daily., Disp: 1 each, Rfl: 12   cetirizine (ZYRTEC) 10 MG tablet, Take 1 tablet (10 mg total) by mouth daily., Disp: 90 tablet, Rfl: 2   Cholecalciferol 25 MCG (1000 UT) tablet, Take 1 tablet (1,000 Units total) by mouth daily., Disp: 30 tablet, Rfl: 2   clobetasol cream (TEMOVATE) 0.05 %, APPLY 1 APPLICATION TOPICALLY TO ITCHY AREA TWICE DAILY, Disp: 15 g, Rfl: 0   Dextromethorphan HBr 10 MG/5ML LIQD, Take 5 mLs (10 mg total) by mouth every 6 (six) hours as needed., Disp: 118 mL, Rfl: 0   fluticasone (FLONASE) 50 MCG/ACT nasal spray, Use 2 spray(s) in each nostril once daily, Disp: 16 g,  Rfl: 2   gabapentin (NEURONTIN) 100 MG capsule, Take 1 capsule (100 mg total) by mouth at bedtime., Disp: 90 capsule, Rfl: 2   hydrochlorothiazide (HYDRODIURIL) 12.5 MG tablet, Take 1 tablet (12.5 mg total) by mouth daily., Disp: 90 tablet, Rfl: 1   meloxicam (MOBIC) 15 MG tablet, Take one tablet by mouth once daily as needed for pain, Disp: 90 tablet, Rfl: 2   metFORMIN (GLUCOPHAGE) 500 MG tablet, Take 1 tablet (500 mg total) by mouth 2 (two) times daily with a meal., Disp: 120 tablet, Rfl: 5   senna (SENOKOT) 8.6 MG tablet, Take 1 tablet (8.6 mg total) by mouth daily., Disp: 90 tablet, Rfl: 2   valsartan (DIOVAN) 160 MG tablet, Take 1 tablet (160 mg total) by mouth daily., Disp: 30 tablet, Rfl: 11  Review of Systems:    HENT: Patient endorses nose bleeds   Physical Exam:  Vitals:   01/22/23 1353  BP: (!) 128/58  Pulse: 74  Temp: 98.3 F (36.8 C)  TempSrc: Oral  SpO2: 98%  Weight: 171 lb 9.6 oz (77.8 kg)  Height: 5\' 4"  (1.626 m)   General: Patient is sitting comfortably in the room  HENT: Normocephalic, atraumatic, right nostril with some friable tissue noted to the septal area. No growths or obvious bleeding vessels.  Cardio: Regular rate and rhythm, no murmurs, rubs or gallops. 2+ pulses to bilateral upper and lower extremities  Pulmonary: Clear to  ausculation bilaterally with no rales, rhonchi, and crackles   Assessment & Plan:   Essential hypertension Patient presents for hypertension follow-up.  Patient's current regimen includes HCTZ 12.5 mg daily, valsartan 160 mg daily, amlodipine 10 mg daily.  He states compliant with his regimen.  Blood pressure today is 128/58.  He denies any chest pain, shortness of breath, or any other concerns.  Plan: -Continue HCTZ 12.5 mg daily, valsartan 160 mg daily, amlodipine 10 mg daily -BMP pending  Type 2 diabetes mellitus with other specified complication [facility]) Patient has a past medical history of type 2 diabetes mellitus.  Most  recent A1c on 02/15/724 was 6.2.  Patient has been under 7 for a a while now.  He reports compliance with his metformin 500 mg twice daily.  He has no concerns about this today.  Plan: -Repeat A1c today Can continue metformin 500 mg twice daily  Hyperlipidemia Patient currently on atorvastatin 40 mg daily.  He reports compliance with his medication.  He has no concerns for intolerance.  Plan: -Continue atorvastatin 40 mg daily -Lipid panel pending  Epistaxis not due to trauma Patient reports over the past 2 weeks he has developed nosebleeds.  This only occurs into his right nostril.  He states that when he blows his nose really hard, he starts to bleed.  He states the bleeding lasts about 30 minutes.  He states that he has no pain with this.  He denies any cocaine use.  He denies any trauma to the nose.  He denies any instrumentation of his nose.  On exam, I do appreciate some friable tissue noted to right nostril nasal septum.  I do not see any obvious bleeding vessels.  I see no growth.  I do not think this is something that needs specialist referral.  I think patient likely has friable tissue secondary to improper Flonase use.  Instruct patient how to use Flonase.  Encourage nasal rinses of the nose.  Plan: -Encourage sinus rinse -Proper use of Flonase instructed -Discourage forceful blowing of nose -Discouraged instrumentation at times  Patient discussed with Dr. Princella Pellegrini, DO PGY-1 Internal Medicine Resident  Pager: [phone]

## 2023-01-22 NOTE — Assessment & Plan Note (Signed)
Patient presents for hypertension follow-up.  Patient's current regimen includes HCTZ 12.5 mg daily, valsartan 160 mg daily, amlodipine 10 mg daily.  He states compliant with his regimen.  Blood pressure today is 128/58.  He denies any chest pain, shortness of breath, or any other concerns.  Plan: -Continue HCTZ 12.5 mg daily, valsartan 160 mg daily, amlodipine 10 mg daily -BMP pending  Addendum: -BMP within normal limits.  Kidney function within normal limits.  Electrolytes within normal limits.  Continue current therapy.

## 2023-01-22 NOTE — Assessment & Plan Note (Addendum)
Patient reports over the past 2 weeks he has developed nosebleeds.  This only occurs into his right nostril.  He states that when he blows his nose really hard, he starts to bleed.  He states the bleeding lasts about 30 minutes.  He states that he has no pain with this.  He denies any cocaine use.  He denies any trauma to the nose.  He denies any instrumentation of his nose.  On exam, I do appreciate some friable tissue noted to right nostril nasal septum.  I do not see any obvious bleeding vessels.  I see no growth.  I do not think this is something that needs specialist referral.  I think patient likely has friable tissue secondary to improper Flonase use.  Instruct patient how to use Flonase.  Encourage nasal rinses of the nose.  Plan: -Encourage sinus rinse -Proper use of Flonase instructed -Discourage forceful blowing of nose -Discouraged instrumentation at times

## 2023-01-22 NOTE — Assessment & Plan Note (Addendum)
Patient has a past medical history of type 2 diabetes mellitus.  Most recent A1c on 02/15/724 was 6.2.  Patient has been under 7 for a a while now.  He reports compliance with his metformin 500 mg twice daily.  He has no concerns about this today.  Plan: -Repeat A1c today Can continue metformin 500 mg twice daily  Addendum: -A1c 6.6.  This is at goal of less than 7.  Continue metformin.

## 2023-01-23 LAB — BMP8+ANION GAP
Anion Gap: 17 mmol/L (ref 10.0–18.0)
BUN/Creatinine Ratio: 14 (ref 10–24)
BUN: 16 mg/dL (ref 8–27)
CO2: 22 mmol/L (ref 20–29)
Calcium: 10.2 mg/dL (ref 8.6–10.2)
Chloride: 99 mmol/L (ref 96–106)
Creatinine, Ser: 1.17 mg/dL (ref 0.76–1.27)
Glucose: 116 mg/dL — ABNORMAL HIGH (ref 70–99)
Potassium: 4.4 mmol/L (ref 3.5–5.2)
Sodium: 138 mmol/L (ref 134–144)
eGFR: 70 mL/min/{1.73_m2} (ref >59)

## 2023-01-23 LAB — LIPID PANEL
Chol/HDL Ratio: 2.8 ratio (ref 0.0–5.0)
Cholesterol, Total: 134 mg/dL (ref 100–199)
HDL: 48 mg/dL (ref >39)
LDL Chol Calc (NIH): 63 mg/dL (ref 0–99)
Triglycerides: 128 mg/dL (ref 0–149)
VLDL Cholesterol Cal: 23 mg/dL (ref 5–40)

## 2023-01-23 LAB — HEMOGLOBIN A1C
Est. average glucose Bld gHb Est-mCnc: 143 mg/dL
Hgb A1c MFr Bld: 6.6 % — ABNORMAL HIGH (ref 4.8–5.6)

## 2023-01-24 NOTE — Progress Notes (Signed)
Internal Medicine Clinic Attending  Case discussed with Dr. Patel  At the time of the visit.  We reviewed the resident's history and exam and pertinent patient test results.  I agree with the assessment, diagnosis, and plan of care documented in the resident's note.  

## 2023-02-04 ENCOUNTER — Encounter: Payer: Self-pay | Admitting: *Deleted

## 2023-03-29 IMAGING — CT CT CHEST LUNG CANCER SCREENING LOW DOSE W/O CM
1 series · 10 of 10 positions shown, 13 images · non-contrast
Comparison: None.

CLINICAL DATA: 64-year-old male former smoker, quit 11 years ago,
with 30 pack-year history of smoking, for initial lung cancer
screening

EXAM:
CT CHEST WITHOUT CONTRAST LOW-DOSE FOR LUNG CANCER SCREENING
TECHNIQUE: Multidetector CT imaging of the chest was performed following the
standard protocol without IV contrast.

[ct lung segmentation data · axial · 0.93mm/px · z∈[+1310,+1310]mm · 10 of 337 frames shown]
[frame 1/337  mediastinal]
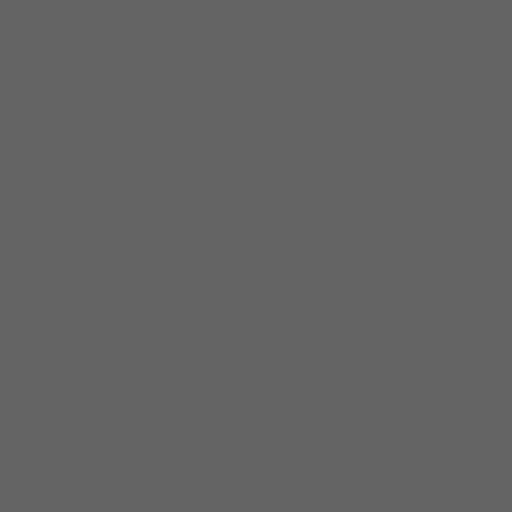
[frame 1/337  lung]
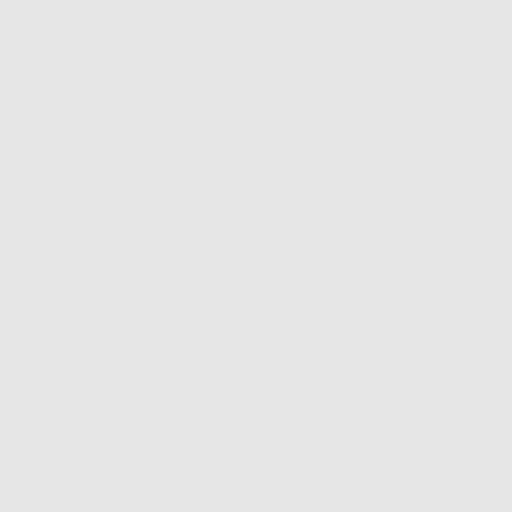
[frame 38/337  lung]
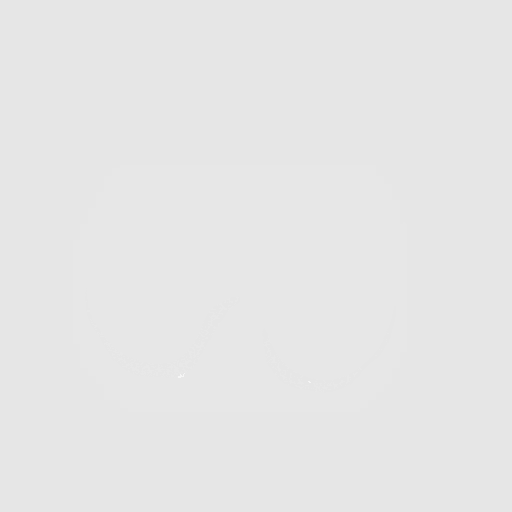
[frame 75/337  lung]
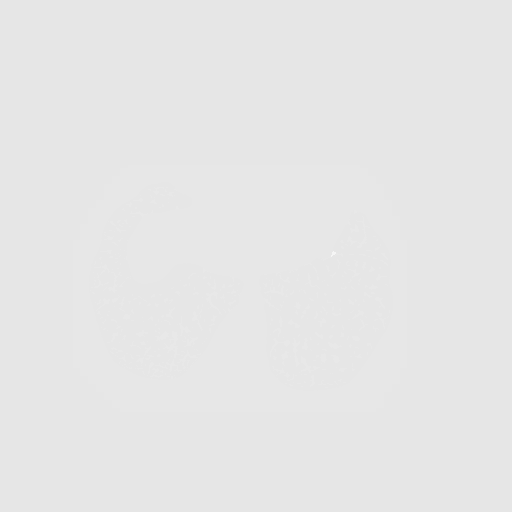
[frame 113/337  lung]
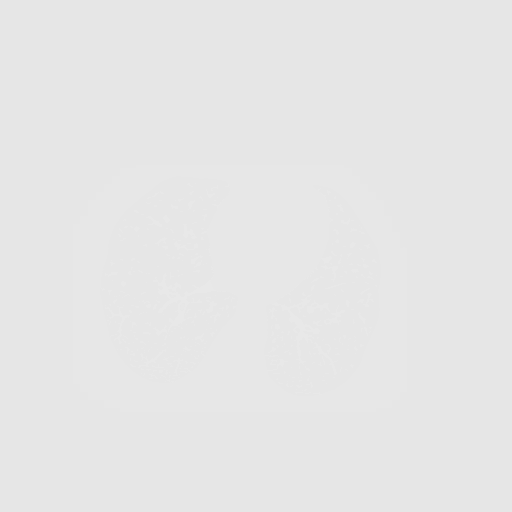
[frame 150/337  mediastinal]
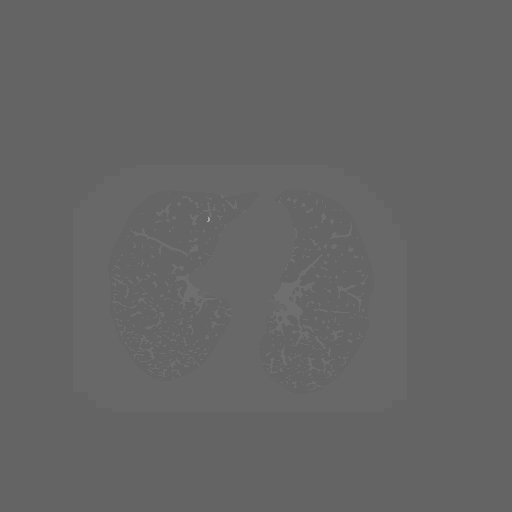
[frame 150/337  lung]
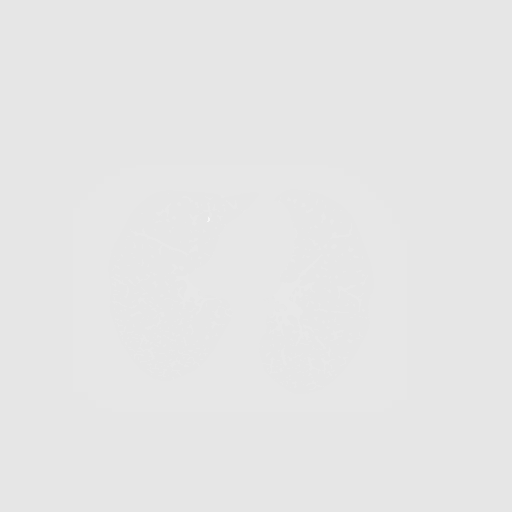
[frame 187/337  lung]
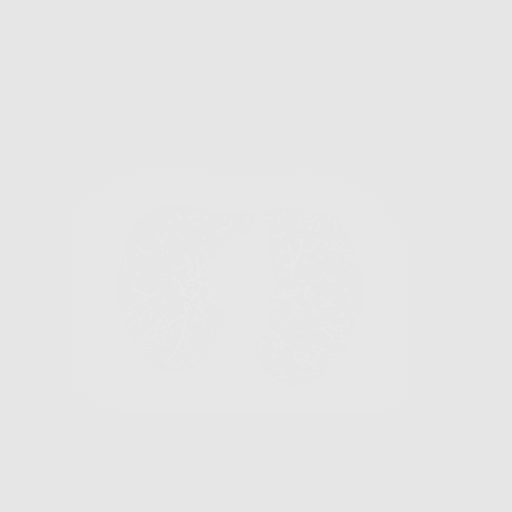
[frame 225/337  lung]
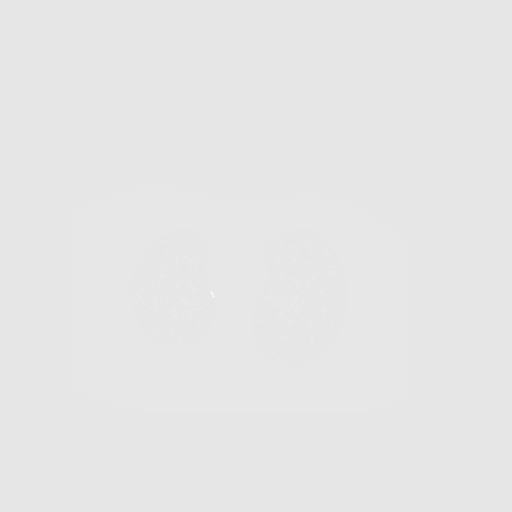
[frame 262/337  lung]
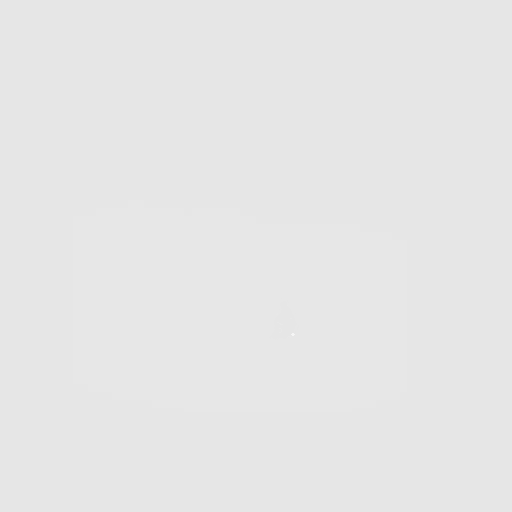
[frame 299/337  mediastinal]
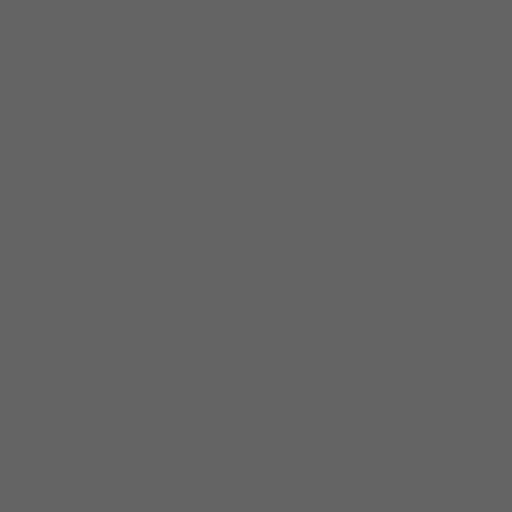
[frame 299/337  lung]
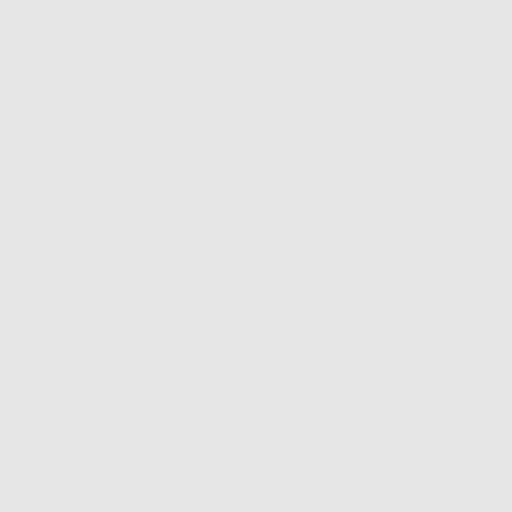
[frame 337/337  lung]
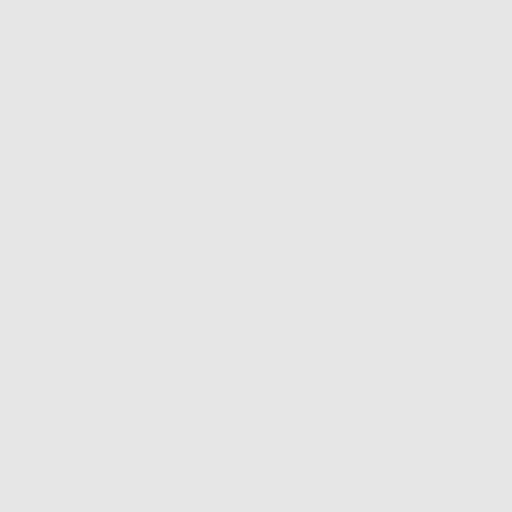

[10 of 10 positions shown; findings below may reference images not displayed]

FINDINGS: Cardiovascular: The heart is normal in size. No pericardial
effusion.

No evidence of thoracic aortic aneurysm. Mild atherosclerotic
calcifications of the aortic arch.

Mediastinum/Nodes: 10 mm short axis subcarinal node with
preservation of the normal fatty hilum. No suspicious mediastinal
lymphadenopathy.

Visualized thyroid is unremarkable.

Lungs/Pleura: Mild right apical pleural-parenchymal scarring.

Mild centrilobular and paraseptal emphysematous changes, upper lung
predominant.

No focal consolidation.

2.8 mm subpleural nodule in the posterior left lower lobe.

No pleural effusion or pneumothorax.

Upper Abdomen: Visualized upper abdomen is grossly unremarkable,
noting mild fatty infiltration of the pancreas (normal variant).

Musculoskeletal: Mild degenerative changes of the mid thoracic
spine.
IMPRESSION: Lung-RADS 2, benign appearance or behavior. Continue annual
screening with low-dose chest CT without contrast in 12 months.

Aortic Atherosclerosis (2TH3H-DKI.I) and Emphysema (2TH3H-COC.0).

## 2023-04-02 ENCOUNTER — Ambulatory Visit: Payer: 59 | Admitting: Pulmonary Disease

## 2023-04-02 ENCOUNTER — Encounter: Payer: Self-pay | Admitting: Pulmonary Disease

## 2023-04-02 VITALS — BP 140/82 | HR 70 | Ht 64.0 in | Wt 168.2 lb

## 2023-04-02 DIAGNOSIS — R0609 Other forms of dyspnea: Secondary | ICD-10-CM

## 2023-04-02 MED ORDER — BREZTRI AEROSPHERE 160-9-4.8 MCG/ACT IN AERO: 2.0000 | INHALATION_SPRAY | Freq: Two times a day (BID) | RESPIRATORY_TRACT | 11 refills | Status: DC

## 2023-04-02 NOTE — Patient Instructions (Addendum)
Nice to see you again  I sent a prescription for [facility] with a copay card to see if it helps cost  If is too expensive let me know and we will look for an alternative  Continue Symbicort if Markus Daft is not an option  Return to clinic in 6 months or sooner if needed with Dr. Judeth Horn

## 2023-04-02 NOTE — Progress Notes (Signed)
@Patient  ID: Samuel Chen, male    DOB: 1956/04/30, 67 y.o.   MRN: [id number]  Chief Complaint  Patient presents with   Follow-up    No concerns    Referring provider: Modena Slater, DO  HPI:   67 y.o. man whom we are seeing in follow up for evaluation of dyspnea on exertion, emphysema, asthma. Most recent note from PCP office x3 reviewed.  Doing ok. No exacerbations since last visit.  Reports adherence to Symbicort.  Prescription of Breztri never sent in but he did use the samples again.  There is been discrepancies and reports initially some concern for making symptoms worse but sometimes make it better.  He thinks reliably was better with the samples.  Discussed new prescription today.  He would like to have this long-term if cost effective.  HPI at initial visit: Patient has longstanding history of dyspnea on exertion.  Largely not limited in day-to-day activities by dyspnea.  Has noticed over the last several months mild worsening of dyspnea on exertion.  Worse on inclines or stairs.  Worse when hot.  No time of day when things are better or worse.  No position where things are better or worse.  Other than that he, no seasonal or environmental changes that make things better or worse.  Symptoms have been well controlled on Advair in the past.  Switched to Symbicort by PCP in the interim since last pulmonary appointment.  Most recent chest imaging 12/2020 low-dose CT lung cancer screening scan reviewed interpreted as clear lungs, mild emphysematous changes with predilection for the apices/upper lobes compared to the rest of the lungs, small left upper lobe nodule.  Report is lung RADS 2 regular 14-month follow-up.  PMH: Hypertension, hyperlipidemia, tobacco abuse in remission, asthma Surgical history: Reviewed, denies any. Family history: He denies any respiratory illnesses in first-degree relatives on review Social history: Former cigarette smoker, 60-pack-year history, continues to  vape nicotine without flavor additives per his report, works for the city of [employer] / Pulmonary Flowsheets:   ACT:  Asthma Control Test ACT Total Score  09/01/2017  2:00 PM 12  07/01/2017  3:00 PM 12    MMRC: mMRC Dyspnea Scale mMRC Score  03/27/2021  1:37 PM 3    Epworth:      No data to display          Tests:   FENO:  No results found for: "NITRICOXIDE"  PFT:     No data to display          WALK:     04/27/2013    9:15 AM  SIX MIN WALK  Supplimental Oxygen during Test? (L/min) No  Tech Comments: Lowest o2 sat during walk 94% RA.      Imaging: Personally reviewed and as per EMR  Lab Results: Personally reviewed, no significant elevation of eosinophils in the past CBC    Component Value Date/Time   WBC 6.5 07/09/2021 1423   WBC 7.1 08/16/2015 1523   RBC 4.11 (L) 07/09/2021 1423   RBC 3.89 (L) 08/16/2015 1545   RBC 3.85 (L) 08/16/2015 1523   HGB 13.1 07/09/2021 1423   HCT 37.8 07/09/2021 1423   PLT 140 (L) 07/09/2021 1423   MCV 92 07/09/2021 1423   MCH 31.9 07/09/2021 1423   MCH 31.2 08/16/2015 1523   MCHC 34.7 07/09/2021 1423   MCHC 33.6 08/16/2015 1523   RDW 12.2 07/09/2021 1423   LYMPHSABS 2.0 07/09/2021 1423   MONOABS  0.8 08/16/2015 1545   EOSABS 0.1 07/09/2021 1423   BASOSABS 0.0 07/09/2021 1423    BMET    Component Value Date/Time   NA 138 01/22/2023 1500   K 4.4 01/22/2023 1500   CL 99 01/22/2023 1500   CO2 22 01/22/2023 1500   GLUCOSE 116 (H) 01/22/2023 1500   GLUCOSE 102 (H) 08/16/2015 1523   BUN 16 01/22/2023 1500   CREATININE 1.17 01/22/2023 1500   CREATININE 0.97 04/14/2014 1334   CALCIUM 10.2 01/22/2023 1500   GFRNONAA 72 08/23/2020 1340   GFRNONAA 88 04/14/2014 1334   GFRAA 84 08/23/2020 1340   GFRAA >89 04/14/2014 1334    BNP No results found for: "BNP"  ProBNP No results found for: "PROBNP"  Specialty Problems       Pulmonary Problems   Allergic rhinitis    Qualifier: Diagnosis  of  By: Phillips Odor  DO, Beth        COPD (chronic obstructive pulmonary disease)Gold B    COPD with asthmatic bronchitis smoking induced.  Golds stage Dorian Pod: 8/ 26:  FeV1 72% FeV1/FVC 68%  Chantix failure 2012-2013       Epistaxis not due to trauma    Allergies  Allergen Reactions   Acetaminophen     REACTION: (causes liver problems)   Aspirin     REACTION: bleeding/nose bleeds   Augmentin [Amoxicillin-Pot Clavulanate] Diarrhea    Immunization History  Administered Date(s) Administered   Hepatitis B 10/23/2006, 11/24/2006, 05/12/2007   Influenza Split 06/12/2011, 06/24/2012   Influenza Whole 06/16/2007, 06/22/2010   Influenza, Quadrivalent, Recombinant, Inj, Pf 05/30/2018   Influenza,inj,Quad PF,6+ Mos 07/08/2013, 05/18/2014, 06/02/2018   Influenza-Unspecified 06/27/2021   PFIZER(Purple Top)SARS-COV-2 Vaccination 09/10/2019, 10/04/2019, 06/30/2020   Pneumococcal Polysaccharide-23 03/27/2010, 09/19/2018   Td 03/27/2010   Tdap 12/29/2018   Zoster Recombinant(Shingrix) 09/19/2018, 11/18/2018   Zoster, Unspecified 06/27/2021    Past Medical History:  Diagnosis Date   Alcohol abuse    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   Dyshidrotic eczema    GERD (gastroesophageal reflux disease)    Hyperlipidemia    Hypertension    Myalgia 11/2007   Likely 2/2 Pravachol   Tobacco abuse     Tobacco History: Social History   Tobacco Use  Smoking Status Former   Current packs/day: 0.00   Average packs/day: 1.5 packs/day for 40.0 years (59.9 ttl pk-yrs)   Types: E-cigarettes, Cigarettes   Start date: 06/03/1983   Quit date: 03/04/2013   Years since quitting: 10.0  Smokeless Tobacco Current  Tobacco Comments   Uses a Vapor.   Ready to quit: Not Answered Counseling given: Not Answered Tobacco comments: Uses a Vapor.   Continue to not smoke  Outpatient Encounter Medications  as of 04/02/2023  Medication Sig   albuterol (PROVENTIL HFA) 108 (90 Base) MCG/ACT inhaler Inhale 2 puffs into the lungs every 6 (six) hours as needed for wheezing or shortness of breath.   amLODipine (NORVASC) 10 MG tablet Take 1 tablet (10 mg total) by mouth daily.   atorvastatin (LIPITOR) 40 MG tablet Take one by mouth once daily   Budeson-Glycopyrrol-Formoterol (BREZTRI AEROSPHERE) 160-9-4.8 MCG/ACT AERO Inhale 2 puffs into the lungs in the morning and at bedtime.   budesonide-formoterol (SYMBICORT) 160-4.5 MCG/ACT inhaler Inhale 2 puffs into the lungs 2 (two) times daily.   cetirizine (ZYRTEC) 10 MG tablet Take 1 tablet (10 mg total) by mouth  daily.   Cholecalciferol 25 MCG (1000 UT) tablet Take 1 tablet (1,000 Units total) by mouth daily.   clobetasol cream (TEMOVATE) 0.05 % APPLY 1 APPLICATION TOPICALLY TO ITCHY AREA TWICE DAILY   Dextromethorphan HBr 10 MG/5ML LIQD Take 5 mLs (10 mg total) by mouth every 6 (six) hours as needed.   fluticasone (FLONASE) 50 MCG/ACT nasal spray Use 2 spray(s) in each nostril once daily   gabapentin (NEURONTIN) 100 MG capsule Take 1 capsule (100 mg total) by mouth at bedtime.   hydrochlorothiazide (HYDRODIURIL) 12.5 MG tablet Take 1 tablet (12.5 mg total) by mouth daily.   meloxicam (MOBIC) 15 MG tablet Take one tablet by mouth once daily as needed for pain   metFORMIN (GLUCOPHAGE) 500 MG tablet Take 1 tablet (500 mg total) by mouth 2 (two) times daily with a meal.   senna (SENOKOT) 8.6 MG tablet Take 1 tablet (8.6 mg total) by mouth daily.   valsartan (DIOVAN) 160 MG tablet Take 1 tablet (160 mg total) by mouth daily.   No facility-administered encounter medications on file as of 04/02/2023.     Review of Systems  Review of Systems  N/a Physical Exam  BP (!) 140/82   Pulse 70   Ht 5\' 4"  (1.626 m)   Wt 168 lb 3.2 oz (76.3 kg)   SpO2 97%   BMI 28.87 kg/m   Wt Readings from Last 5 Encounters:  04/02/23 168 lb 3.2 oz (76.3 kg)  01/22/23 171 lb  9.6 oz (77.8 kg)  11/20/22 175 lb 4.8 oz (79.5 kg)  10/17/22 171 lb 3.2 oz (77.7 kg)  07/18/22 174 lb 6.4 oz (79.1 kg)    BMI Readings from Last 5 Encounters:  04/02/23 28.87 kg/m  01/22/23 29.46 kg/m  11/20/22 30.09 kg/m  10/17/22 29.39 kg/m  07/18/22 29.94 kg/m     Physical Exam General: Sitting in chair, no acute distress Eyes: EOMI, no icterus Neck: Supple, no JVP Cardiovascular: Regular rate and rhythm, no murmur appreciated Pulmonary: Normal work of breathing, on room air, a bit distant but clear to auscultation bilaterally Abdomen: Nondistended, bowel sounds present MSK: No synovitis, no joint effusion Neuro: Normal gait, no weakness Psych: Normal mood, full affect  Assessment & Plan:   Asthma: Based on history of bronchitic and atopic symptoms.  Previously pretty well controlled on Advair then switch to Symbicort at a later date.  Overall doing okay on Symbicort.  Exacerbation 09/2021.  New prescription for [facility], historically cost prohibitive.  In the past symptoms improved on Breztri compared to Symbicort.  Will try again, new prescription today.   Emphysema: As seen on CT scan.  Related to prior smoking history.  PFTs in the past 2017 and most recently 2018 without fixed obstruction.  Return in about 6 months (around 10/03/2023).   Karren Burly, MD 04/02/2023

## 2023-05-07 ENCOUNTER — Other Ambulatory Visit: Payer: Self-pay

## 2023-05-07 ENCOUNTER — Ambulatory Visit: Payer: 59 | Admitting: Student

## 2023-05-07 ENCOUNTER — Encounter: Payer: Self-pay | Admitting: Student

## 2023-05-07 VITALS — BP 144/70 | HR 81 | Temp 98.2°F | Ht 64.0 in | Wt 168.3 lb

## 2023-05-07 DIAGNOSIS — I10 Essential (primary) hypertension: Secondary | ICD-10-CM | POA: Diagnosis not present

## 2023-05-07 DIAGNOSIS — R0989 Other specified symptoms and signs involving the circulatory and respiratory systems: Secondary | ICD-10-CM | POA: Insufficient documentation

## 2023-05-07 DIAGNOSIS — J441 Chronic obstructive pulmonary disease with (acute) exacerbation: Secondary | ICD-10-CM | POA: Diagnosis not present

## 2023-05-07 DIAGNOSIS — Z7984 Long term (current) use of oral hypoglycemic drugs: Secondary | ICD-10-CM | POA: Diagnosis not present

## 2023-05-07 DIAGNOSIS — M7062 Trochanteric bursitis, left hip: Secondary | ICD-10-CM

## 2023-05-07 DIAGNOSIS — R252 Cramp and spasm: Secondary | ICD-10-CM

## 2023-05-07 DIAGNOSIS — E785 Hyperlipidemia, unspecified: Secondary | ICD-10-CM

## 2023-05-07 DIAGNOSIS — E1169 Type 2 diabetes mellitus with other specified complication: Secondary | ICD-10-CM | POA: Diagnosis not present

## 2023-05-07 DIAGNOSIS — R7303 Prediabetes: Secondary | ICD-10-CM

## 2023-05-07 DIAGNOSIS — J449 Chronic obstructive pulmonary disease, unspecified: Secondary | ICD-10-CM

## 2023-05-07 DIAGNOSIS — Z87891 Personal history of nicotine dependence: Secondary | ICD-10-CM

## 2023-05-07 LAB — POCT GLYCOSYLATED HEMOGLOBIN (HGB A1C): Hemoglobin A1C: 6.3 % — AB (ref 4.0–5.6)

## 2023-05-07 LAB — GLUCOSE, CAPILLARY: Glucose-Capillary: 99 mg/dL (ref 70–99)

## 2023-05-07 MED ORDER — ALBUTEROL SULFATE HFA 108 (90 BASE) MCG/ACT IN AERS: 2.0000 | INHALATION_SPRAY | Freq: Four times a day (QID) | RESPIRATORY_TRACT | 3 refills | Status: DC | PRN

## 2023-05-07 MED ORDER — ATORVASTATIN CALCIUM 40 MG PO TABS
ORAL_TABLET | ORAL | 2 refills | Status: DC
Start: 2023-05-07 — End: 2023-08-13

## 2023-05-07 MED ORDER — PREDNISONE 20 MG PO TABS
40.0000 mg | ORAL_TABLET | Freq: Every day | ORAL | 0 refills | Status: AC
Start: 2023-05-07 — End: 2023-05-12

## 2023-05-07 MED ORDER — VALSARTAN 160 MG PO TABS
160.0000 mg | ORAL_TABLET | Freq: Every day | ORAL | 11 refills | Status: DC
Start: 2023-05-07 — End: 2023-08-13

## 2023-05-07 MED ORDER — HYDROCHLOROTHIAZIDE 12.5 MG PO TABS
25.0000 mg | ORAL_TABLET | Freq: Every day | ORAL | 2 refills | Status: DC
Start: 2023-05-07 — End: 2023-08-13

## 2023-05-07 MED ORDER — AZITHROMYCIN 250 MG PO TABS
ORAL_TABLET | ORAL | 0 refills | Status: DC
Start: 2023-05-07 — End: 2023-09-11

## 2023-05-07 MED ORDER — MELOXICAM 15 MG PO TABS
ORAL_TABLET | ORAL | 2 refills | Status: DC
Start: 2023-05-07 — End: 2023-08-13

## 2023-05-07 NOTE — Assessment & Plan Note (Addendum)
Patient was last seen for hypertension follow-up on 01/23/2023.  Blood pressure today is elevated at 144/70.  Patient's current medication regimen for blood pressure includes hydrochlorothiazide 12.5 mg daily, valsartan 160 mg daily, amlodipine 10 mg daily.  He says he is having no issues with this medication regimen and has been taking his medications as prescribed.  He denies any symptoms of hypertension including headache, chest pain, blurry vision, shortness of breath, lightheadedness/dizziness.  BMP at prior visit was within normal limits, will hold off for now on repeating.   Plan: - Increase HCTZ to 25 mg daily  - Continue losartan 160 mg daily, amlodipine 10 mg daily - Repeat BMP next visit

## 2023-05-07 NOTE — Patient Instructions (Addendum)
Mr. Samuel Chen,  Thank you for coming into your appointment at the internal medicine center this afternoon.  As we discussed, your symptoms are consistent with a COPD exacerbation.  We are prescribing you prednisone 40 mg, which you should take every day for the next 5 days.  We are also prescribing azithromycin, an antibiotic, which you should take as follows: 500 mg (2 tablets) today, followed by 250 mg (1 tablet) for the next 4 days.  Please also increase the frequency of your albuterol use to 3-4 times per day for the next few days.  Your blood pressure was elevated today at 144/70, so we increased your hydrochlorothiazide dose to 25 mg daily.  We have sent this to your pharmacy.  Please continue to take your other blood pressure medications as prescribed.  We collected a hemoglobin A1c today to monitor your diabetes.  We will contact you as soon as we have the results.  If you have any questions in the meantime, please give [place] a call at [phone].  Take care,  Annett Fabian, MD

## 2023-05-07 NOTE — Assessment & Plan Note (Deleted)
Patient presents with 6 days of chest congestion and cough in the setting of COPD.Says he coughed up yellowish sputum but in the past day or so it has turned green. Says he has shortness of breath with exertion. Denies any recent fevers or illness, has not tested for COVID. Says it feels similar to past episodes of COPD exacerbation. He is vaping regularly, has stopped smoking. Is on the 3, which he says is low.

## 2023-05-07 NOTE — Assessment & Plan Note (Addendum)
He has a history of type 2 diabetes, for which he takes metformin 500 mg twice daily.  He denies any GI side effects or other issues with his medication.  His most recent hemoglobin A1c was 6.6.  He denies any diabetic neuropathy, increased polyuria or polydipsia, or blurred vision or abdominal pain.  Plan: - Repeat A1c today - Continue metformin 500 mg twice daily - Follow up in 3 months

## 2023-05-07 NOTE — Assessment & Plan Note (Addendum)
Patient presents with 6 days of chest congestion, cough, and increased sputum with color change in the setting of COPD.  He says he was coughing up yellow sputum for the past week, but it has changed or greenish color in the past few days.  He also reports increasing shortness of breath with exertion which is worse than his baseline. Denies any recent fevers or sick contacts. He has not tested for COVID.  He says his symptoms feel similar to past episodes of COPD exacerbation.  He has stopped smoking, but is now vaping regularly.  He is unable to quantify the amount, but says it is a "low amount."    On exam, he has wheezing most prominent in the left middle and upper lobes, with both inspiration and expiration.  He is afebrile, satting at 99% on room air.  No accessory muscle use or respiratory distress is present. He is currently using albuterol and symbicort. He recently saw the pulmonologist who started him on Breztri, but he says he was unable to tolerate it so he returned to using symbicort.   Given his worsening dyspnea, congestion, increasing in sputum with change in color, I think is most likely a COPD exacerbation, so we will treat him with antibiotics and a short course of steroids.  Plan: -Prednisone 40 mg x 5 days -Azithromycin 500 mg for 1 day, followed by 250 mg for 4 days -Recommended using albuterol more frequently, every 4-6 hours

## 2023-05-07 NOTE — Progress Notes (Signed)
CC: Chest congestion  HPI:  [name] is a 67 y.o. male living with a history stated below and presents today for chest congestion. Please see problem based assessment and plan for additional details.  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   Dyshidrotic eczema    GERD (gastroesophageal reflux disease)    Hyperlipidemia    Hypertension    Myalgia 11/2007   Likely 2/2 Pravachol   Tobacco abuse     Current Outpatient Medications on File Prior to Visit  Medication Sig Dispense Refill   amLODipine (NORVASC) 10 MG tablet Take 1 tablet (10 mg total) by mouth daily. 90 tablet 2   budesonide-formoterol (SYMBICORT) 160-4.5 MCG/ACT inhaler Inhale 2 puffs into the lungs 2 (two) times daily. 1 each 12   cetirizine (ZYRTEC) 10 MG tablet Take 1 tablet (10 mg total) by mouth daily. 90 tablet 2   Cholecalciferol 25 MCG (1000 UT) tablet Take 1 tablet (1,000 Units total) by mouth daily. 30 tablet 2   clobetasol cream (TEMOVATE) 0.05 % APPLY 1 APPLICATION TOPICALLY TO ITCHY AREA TWICE DAILY 15 g 0   Dextromethorphan HBr 10 MG/5ML LIQD Take 5 mLs (10 mg total) by mouth every 6 (six) hours as needed. 118 mL 0   fluticasone (FLONASE) 50 MCG/ACT nasal spray Use 2 spray(s) in each nostril once daily 16 g 2   gabapentin (NEURONTIN) 100 MG capsule Take 1 capsule (100 mg total) by mouth at bedtime. 90 capsule 2   hydrochlorothiazide (HYDRODIURIL) 12.5 MG tablet Take 1 tablet (12.5 mg total) by mouth daily. 90 tablet 1   metFORMIN (GLUCOPHAGE) 500 MG tablet Take 1 tablet (500 mg total) by mouth 2 (two) times daily with a meal. 120 tablet 5   senna (SENOKOT) 8.6 MG tablet Take 1 tablet (8.6 mg total) by mouth daily. 90 tablet 2   No current facility-administered medications on file prior to visit.    Family History  Problem Relation Age of Onset    Heart disease Sister    Heart attack Sister        34-35yo   Heart disease Father     Social History   Socioeconomic History   Marital status: Single    Spouse name: Not on file   Number of children: Not on file   Years of education: Not on file   Highest education level: Not on file  Occupational History   Not on file  Tobacco Use   Smoking status: Former    Current packs/day: 0.00    Average packs/day: 1.5 packs/day for 40.0 years (59.9 ttl pk-yrs)    Types: E-cigarettes, Cigarettes    Start date: 06/03/1983    Quit date: 03/04/2013    Years since quitting: 10.1   Smokeless tobacco: Current   Tobacco comments:    Uses a Vapor.  Substance and Sexual Activity   Alcohol use: Yes    Alcohol/week: 20.0 standard drinks of alcohol    Types: 20 Cans of beer per week    Comment: PER WEEK   Drug use: No   Sexual activity: Not on file  Other Topics Concern   Not on file  Social History Narrative   Financial assistance approved for 100% discount at [facility] and has [facility] card per Rudell Cobb October 13,2011 4:21PM   Social Determinants of  Health   Financial Resource Strain: Low Risk  (07/18/2022)   Overall Financial Resource Strain (CARDIA)    Difficulty of Paying Living Expenses: Not hard at all  Food Insecurity: No Food Insecurity (07/18/2022)   Hunger Vital Sign    Worried About Running Out of Food in the Last Year: Never true    Ran Out of Food in the Last Year: Never true  Transportation Needs: No Transportation Needs (07/18/2022)   PRAPARE - Administrator, Civil Service (Medical): No    Lack of Transportation (Non-Medical): No  Physical Activity: Sufficiently Active (07/18/2022)   Exercise Vital Sign    Days of Exercise per Week: 6 days    Minutes of Exercise per Session: 150+ min  Stress: No Stress Concern Present (07/18/2022)   [employer] of Occupational Health - Occupational Stress Questionnaire    Feeling of Stress : Not at all  Social  Connections: Socially Isolated (07/18/2022)   Social Connection and Isolation Panel [NHANES]    Frequency of Communication with Friends and Family: Once a week    Frequency of Social Gatherings with Friends and Family: Once a week    Attends Religious Services: Never    Database administrator or Organizations: No    Attends Banker Meetings: Never    Marital Status: Divorced  Catering manager Violence: Not At Risk (07/18/2022)   Humiliation, Afraid, Rape, and Kick questionnaire    Fear of Current or Ex-Partner: No    Emotionally Abused: No    Physically Abused: No    Sexually Abused: No    Review of Systems: ROS negative except for what is noted on the assessment and plan.  Vitals:   05/07/23 1346  BP: (!) 144/70  Pulse: 81  Temp: 98.2 F (36.8 C)  TempSrc: Oral  SpO2: 99%  Weight: 168 lb 4.8 oz (76.3 kg)  Height: 5\' 4"  (1.626 m)    Physical Exam: Constitutional: well-appearing, in no acute distress HENT: normocephalic atraumatic, mucous membranes moist Eyes: conjunctiva non-erythematous Cardiovascular: regular rate and rhythm, no m/r/g Pulmonary/Chest: inspiratory + expiratory wheezing bilaterally, more predominant in left upper lobes; normal inspiratory effort, no accessory muscle use Abdominal: soft, non-tender, non-distended MSK: normal bulk and tone Neurological: alert & oriented x 3, no focal deficit Skin: warm and dry Psych: normal mood and behavior  Assessment & Plan:   Patient seen with Dr. Oswaldo Done  Essential hypertension Patient was last seen for hypertension follow-up on 01/23/2023.  Blood pressure today is elevated at 144/70.  Patient's current medication regimen for blood pressure includes hydrochlorothiazide 12.5 mg daily, valsartan 160 mg daily, amlodipine 10 mg daily.  He says he is having no issues with this medication regimen and has been taking his medications as prescribed.  He denies any symptoms of hypertension including headache,  chest pain, blurry vision, shortness of breath, lightheadedness/dizziness.  BMP at prior visit was within normal limits, will hold off for now on repeating.   Plan: - Increase HCTZ to 25 mg daily  - Continue losartan 160 mg daily, amlodipine 10 mg daily - Repeat BMP next visit  Type 2 diabetes mellitus with other specified complication (HCC) He has a history of type 2 diabetes, for which he takes metformin 500 mg twice daily.  He denies any GI side effects or other issues with his medication.  His most recent hemoglobin A1c was 6.6.  He denies any diabetic neuropathy, increased polyuria or polydipsia, or blurred vision or abdominal pain.  Plan: - Repeat A1c today - Continue metformin 500 mg twice daily - Follow up in 3 months  COPD exacerbation (HCC) Patient presents with 6 days of chest congestion, cough, and increased sputum with color change in the setting of COPD.  He says he was coughing up yellow sputum for the past week, but it has changed or greenish color in the past few days.  He also reports increasing shortness of breath with exertion which is worse than his baseline. Denies any recent fevers or sick contacts. He has not tested for COVID.  He says his symptoms feel similar to past episodes of COPD exacerbation.  He has stopped smoking, but is now vaping regularly.  He is unable to quantify the amount, but says it is a "low amount."    On exam, he has wheezing most prominent in the left middle and upper lobes, with both inspiration and expiration.  He is afebrile, satting at 99% on room air.  No accessory muscle use or respiratory distress is present. He is currently using albuterol and symbicort. He recently saw the pulmonologist who started him on Breztri, but he says he was unable to tolerate it so he returned to using symbicort.   Given his worsening dyspnea, congestion, increasing in sputum with change in color, I think is most likely a COPD exacerbation, so we will treat him with  antibiotics and a short course of steroids.  Plan: -Prednisone 40 mg x 5 days -Azithromycin 500 mg for 1 day, followed by 250 mg for 4 days -Recommended using albuterol more frequently, every 4-6 hours   Annett Fabian, MD  [facility] Internal Medicine, PGY-1 Phone: [phone] Date 05/07/2023 Time 3:05 PM

## 2023-05-08 NOTE — Progress Notes (Signed)
Internal Medicine Clinic Attending  I was physically present during the key portions of the resident provided service and participated in the medical decision making of patient's management care. I reviewed pertinent patient test results.  The assessment, diagnosis, and plan were formulated together and I agree with the documentation in the resident's note.  Erlinda Hong, MD FACP

## 2023-07-03 ENCOUNTER — Other Ambulatory Visit: Payer: Self-pay

## 2023-07-03 DIAGNOSIS — J329 Chronic sinusitis, unspecified: Secondary | ICD-10-CM

## 2023-07-04 MED ORDER — FLUTICASONE PROPIONATE 50 MCG/ACT NA SUSP
NASAL | 2 refills | Status: DC
Start: 2023-07-04 — End: 2023-08-13

## 2023-08-13 ENCOUNTER — Ambulatory Visit: Payer: 59 | Admitting: Student

## 2023-08-13 ENCOUNTER — Other Ambulatory Visit: Payer: Self-pay | Admitting: Student

## 2023-08-13 ENCOUNTER — Encounter: Payer: Self-pay | Admitting: Student

## 2023-08-13 VITALS — BP 136/65 | HR 80 | Temp 98.3°F | Ht 64.0 in | Wt 171.6 lb

## 2023-08-13 DIAGNOSIS — M62838 Other muscle spasm: Secondary | ICD-10-CM

## 2023-08-13 DIAGNOSIS — M7062 Trochanteric bursitis, left hip: Secondary | ICD-10-CM | POA: Diagnosis not present

## 2023-08-13 DIAGNOSIS — E1169 Type 2 diabetes mellitus with other specified complication: Secondary | ICD-10-CM

## 2023-08-13 DIAGNOSIS — Z7984 Long term (current) use of oral hypoglycemic drugs: Secondary | ICD-10-CM

## 2023-08-13 DIAGNOSIS — L301 Dyshidrosis [pompholyx]: Secondary | ICD-10-CM

## 2023-08-13 DIAGNOSIS — E785 Hyperlipidemia, unspecified: Secondary | ICD-10-CM

## 2023-08-13 DIAGNOSIS — R252 Cramp and spasm: Secondary | ICD-10-CM

## 2023-08-13 DIAGNOSIS — J329 Chronic sinusitis, unspecified: Secondary | ICD-10-CM

## 2023-08-13 DIAGNOSIS — I10 Essential (primary) hypertension: Secondary | ICD-10-CM | POA: Diagnosis not present

## 2023-08-13 DIAGNOSIS — R7303 Prediabetes: Secondary | ICD-10-CM

## 2023-08-13 DIAGNOSIS — J449 Chronic obstructive pulmonary disease, unspecified: Secondary | ICD-10-CM

## 2023-08-13 LAB — POCT GLYCOSYLATED HEMOGLOBIN (HGB A1C): Hemoglobin A1C: 6.2 % — AB (ref 4.0–5.6)

## 2023-08-13 LAB — GLUCOSE, CAPILLARY: Glucose-Capillary: 109 mg/dL — ABNORMAL HIGH (ref 70–99)

## 2023-08-13 MED ORDER — AMLODIPINE BESYLATE 10 MG PO TABS: 10.0000 mg | ORAL_TABLET | Freq: Every day | ORAL | 2 refills | Status: DC

## 2023-08-13 MED ORDER — ALBUTEROL SULFATE HFA 108 (90 BASE) MCG/ACT IN AERS: 2.0000 | INHALATION_SPRAY | Freq: Four times a day (QID) | RESPIRATORY_TRACT | 3 refills | Status: AC | PRN

## 2023-08-13 MED ORDER — HYDROCHLOROTHIAZIDE 12.5 MG PO TABS: 12.5000 mg | ORAL_TABLET | Freq: Every day | ORAL | 1 refills | Status: DC

## 2023-08-13 MED ORDER — ATORVASTATIN CALCIUM 40 MG PO TABS: ORAL_TABLET | ORAL | 2 refills | Status: DC

## 2023-08-13 MED ORDER — VALSARTAN 160 MG PO TABS: 160.0000 mg | ORAL_TABLET | Freq: Every day | ORAL | 11 refills | Status: DC

## 2023-08-13 MED ORDER — CETIRIZINE HCL 10 MG PO TABS: 10.0000 mg | ORAL_TABLET | Freq: Every day | ORAL | 2 refills | Status: DC

## 2023-08-13 MED ORDER — MELOXICAM 15 MG PO TABS: ORAL_TABLET | ORAL | 2 refills | Status: DC

## 2023-08-13 MED ORDER — BUDESONIDE-FORMOTEROL FUMARATE 160-4.5 MCG/ACT IN AERO: 2.0000 | INHALATION_SPRAY | Freq: Two times a day (BID) | RESPIRATORY_TRACT | 12 refills | Status: DC

## 2023-08-13 MED ORDER — CLOBETASOL PROPIONATE 0.05 % EX CREA: TOPICAL_CREAM | CUTANEOUS | 0 refills | Status: DC

## 2023-08-13 MED ORDER — METFORMIN HCL 500 MG PO TABS: 500.0000 mg | ORAL_TABLET | Freq: Two times a day (BID) | ORAL | 5 refills | Status: DC

## 2023-08-13 MED ORDER — FLUTICASONE PROPIONATE 50 MCG/ACT NA SUSP: NASAL | 2 refills | Status: DC

## 2023-08-13 MED ORDER — GABAPENTIN 100 MG PO CAPS
100.0000 mg | ORAL_CAPSULE | Freq: Every day | ORAL | 2 refills | Status: DC
Start: 2023-08-13 — End: 2023-11-06

## 2023-08-13 NOTE — Progress Notes (Signed)
CC: HTN, diabetes follow up  HPI:  [name] is a 67 y.o. male living with a history stated below and presents today for HTN, diabetes follow up. Please see problem based assessment and plan for additional details.  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   Dyshidrotic eczema    GERD (gastroesophageal reflux disease)    Hyperlipidemia    Hypertension    Myalgia 11/2007   Likely 2/2 Pravachol   Tobacco abuse     Current Outpatient Medications on File Prior to Visit  Medication Sig Dispense Refill   azithromycin (ZITHROMAX Z-PAK) 250 MG tablet Please take 500 mg (2 tablets) today, then 250 mg (1 tablet) for the next 4 days 6 each 0   Cholecalciferol 25 MCG (1000 UT) tablet Take 1 tablet (1,000 Units total) by mouth daily. 30 tablet 2   Dextromethorphan HBr 10 MG/5ML LIQD Take 5 mLs (10 mg total) by mouth every 6 (six) hours as needed. 118 mL 0   senna (SENOKOT) 8.6 MG tablet Take 1 tablet (8.6 mg total) by mouth daily. 90 tablet 2   No current facility-administered medications on file prior to visit.    Family History  Problem Relation Age of Onset   Heart disease Sister    Heart attack Sister        34-35yo   Heart disease Father     Social History   Socioeconomic History   Marital status: Single    Spouse name: Not on file   Number of children: Not on file   Years of education: Not on file   Highest education level: Not on file  Occupational History   Not on file  Tobacco Use   Smoking status: Former    Current packs/day: 0.00    Average packs/day: 1.5 packs/day for 40.0 years (59.9 ttl pk-yrs)    Types: E-cigarettes, Cigarettes    Start date: 06/03/1983    Quit date: 03/04/2013    Years since quitting: 10.4   Smokeless tobacco: Current   Tobacco comments:    Uses a Vapor.  Substance and Sexual  Activity   Alcohol use: Yes    Alcohol/week: 20.0 standard drinks of alcohol    Types: 20 Cans of beer per week    Comment: PER WEEK   Drug use: No   Sexual activity: Not on file  Other Topics Concern   Not on file  Social History Narrative   Financial assistance approved for 100% discount at [facility] and has [facility] card per Rudell Cobb October 13,2011 4:21PM   Social Determinants of Health   Financial Resource Strain: Low Risk  (07/18/2022)   Overall Financial Resource Strain (CARDIA)    Difficulty of Paying Living Expenses: Not hard at all  Food Insecurity: No Food Insecurity (07/18/2022)   Hunger Vital Sign    Worried About Running Out of Food in the Last Year: Never true    Ran Out of Food in the Last Year: Never true  Transportation Needs: No Transportation Needs (07/18/2022)   PRAPARE - Administrator, Civil Service (Medical): No    Lack of Transportation (Non-Medical): No  Physical Activity: Sufficiently Active (07/18/2022)   Exercise Vital Sign    Days of Exercise per Week: 6 days    Minutes of Exercise per Session: 150+ min  Stress: No Stress Concern Present (07/18/2022)   [employer] of Occupational Health - Occupational Stress Questionnaire    Feeling of Stress : Not at all  Social Connections: Socially Isolated (07/18/2022)   Social Connection and Isolation Panel [NHANES]    Frequency of Communication with Friends and Family: Once a week    Frequency of Social Gatherings with Friends and Family: Once a week    Attends Religious Services: Never    Database administrator or Organizations: No    Attends Banker Meetings: Never    Marital Status: Divorced  Catering manager Violence: Not At Risk (07/18/2022)   Humiliation, Afraid, Rape, and Kick questionnaire    Fear of Current or Ex-Partner: No    Emotionally Abused: No    Physically Abused: No    Sexually Abused: No   Review of Systems: ROS negative except for what is noted on the  assessment and plan.  Vitals:   08/13/23 1323 08/13/23 1337  BP: (!) 144/65 136/65  Pulse: 81 80  Temp: 98.3 F (36.8 C)   TempSrc: Oral   SpO2: 98%   Weight: 171 lb 9.6 oz (77.8 kg)   Height: 5\' 4"  (1.626 m)     Physical Exam: Constitutional: well appearing gentleman Cardiovascular: regular rate and rhythm, no murmurs, no LE edema Pulmonary/Chest: CTA bilaterally, normal respiratory effort and rate MSK: normal bulk and tone; no tenderness to palpation of the muscles; range of motion and strength in tact Neurological: alert & oriented x 3, no focal deficits Psych: normal mood and behavior Foot exam: feet normal in appearance with no lesions or skin changes; tibial and dorsalis pedis pulses palpated bilaterally; feet warm and well-perfused; sensation to light touch in tact throughout on both feet  Assessment & Plan:   Patient seen with Dr. Elza Rafter in refills for all his medications today per his request.   Essential hypertension Patient has a history of hypertension. Blood pressure today is BP: 136/65. He is currently taking hydrochlorothiazide 12.5 mg daily, valsartan 160 mg daily, and amlodipine 10 mg daily with no concerns. He denies any ongoing symptoms associated with high blood pressure including headaches, dizziness/lightheadedness, chest pain, shortness of breath, or vision changes. His blood pressure is well-controlled, so we will continue with his current regimen.   Plan - Continue hydrochlorothiazide 12.5 mg daily, losartan 160 mg daily, amlodipine 10 mg daily  - BMP today  Type 2 diabetes mellitus with other specified complication (HCC) He has a history of type 2 diabetes.  Rechecked hemoglobin A1c today, which was 6.2%. This is stable from his last one 3 months ago which was 6.3%.  He is currently taking metformin 500 mg twice daily.  He denies any issues with his medication.  He denies any new polyuria, polydipsia, or neuropathy. Foot exam completed today,  which was normal. He has an eye appointment scheduled for next month.   Plan - Continue metformin 500 mg twice daily - Foot exam today - Urine microalbumin/creatinine  Muscle cramps He continues to have muscle cramping at night, which has been a chronic problem.  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  He has been taking gabapentin 100 mg nightly and meloxicam 15 mg daily for symptomatic treatment, which has been helping somewhat, but his cramps have continued.  He says he is drinking plenty of water.  His electrolytes have been within normal limits.  Recent TSH was within normal limits.   Plan - Instructed the patient to trial stopping the atorvastatin, as this may be contributing to his cramping.  We will follow-up with this at his next visit. - Checking a BMP today - Continue meloxicam and gabapentin for now, however I talked with the patient about switching off of meloxicam at his next appointment, as it is not a great medication to take long term and he does not feel that it helps him very much  Annett Fabian, MD  [facility] Internal Medicine, PGY-1 Phone: [phone] Date 08/13/2023 Time 4:21 PM

## 2023-08-13 NOTE — Assessment & Plan Note (Addendum)
He has a history of type 2 diabetes.  Rechecked hemoglobin A1c today, which was 6.2%. This is stable from his last one 3 months ago which was 6.3%.  He is currently taking metformin 500 mg twice daily.  He denies any issues with his medication.  He denies any new polyuria, polydipsia, or neuropathy. Foot exam completed today, which was normal. He has an eye appointment scheduled for next month.   Plan - Continue metformin 500 mg twice daily - Foot exam today - Urine microalbumin/creatinine

## 2023-08-13 NOTE — Assessment & Plan Note (Addendum)
He continues to have muscle cramping at night, which has been a chronic problem.  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  He has been taking gabapentin 100 mg nightly and meloxicam 15 mg daily for symptomatic treatment, which has been helping somewhat, but his cramps have continued.  He says he is drinking plenty of water.  His electrolytes have been within normal limits.  Recent TSH was within normal limits.   Plan - Instructed the patient to trial stopping the atorvastatin, as this may be contributing to his cramping.  We will follow-up with this at his next visit. - Checking a BMP today - Continue meloxicam and gabapentin for now, however I talked with the patient about switching off of meloxicam at his next appointment, as it is not a great medication to take long term and he does not feel that it helps him very much

## 2023-08-13 NOTE — Patient Instructions (Signed)
Thank you, Mr.Antwaun K Grode for allowing [place] to provide your care today. Today we discussed your high blood pressure, diabetes, and muscle cramps.  For your high blood pressure, please continue taking your current medications as prescribed including hydrochlorothiazide 12.5 mg daily, valsartan 160 mg daily, amlodipine 10 mg daily.  For your diabetes, your hemoglobin A1c was 6.2% today, which is fantastic.  Please continue taking metformin 500 mg twice daily and limiting your carbs and sugars in her diet.  We are checking a urine kidney function test to assess whether your kidneys have been impacted by her diabetes.  I will call you soon as we have the results.  For your muscle cramps, as we described, please stop taking the atorvastatin (Lipitor) for a few weeks to see if your cramps resolved, as this is a common side effect of this medication.  We will follow-up on this at your next visit.  I have sent refills of her medications to your pharmacy.  I have ordered the following labs for you:   Lab Orders         Glucose, capillary         BMP8+Anion Gap         Microalbumin / Creatinine Urine Ratio         POC Hbg A1C      Follow up: 3 months   We look forward to seeing you next time. Please call our clinic at [phone] if you have any questions or concerns. The best time to call is Monday-Friday from 9am-4pm, but there is someone available 24/7. If after hours or the weekend, call the main hospital number and ask for the Internal Medicine Resident On-Call. If you need medication refills, please notify your pharmacy one week in advance and they will send [place] a request.   Thank you for trusting me with your care. Wishing you the best!   Annett Fabian, MD [facility]

## 2023-08-13 NOTE — Assessment & Plan Note (Addendum)
Patient has a history of hypertension. Blood pressure today is BP: 136/65. He is currently taking hydrochlorothiazide 12.5 mg daily, valsartan 160 mg daily, and amlodipine 10 mg daily with no concerns. He denies any ongoing symptoms associated with high blood pressure including headaches, dizziness/lightheadedness, chest pain, shortness of breath, or vision changes. His blood pressure is well-controlled, so we will continue with his current regimen.   Plan - Continue hydrochlorothiazide 12.5 mg daily, losartan 160 mg daily, amlodipine 10 mg daily  - BMP today

## 2023-08-14 LAB — MICROALBUMIN / CREATININE URINE RATIO
Creatinine, Urine: 82.8 mg/dL
Microalb/Creat Ratio: 13 mg/g{creat} (ref 0–29)
Microalbumin, Urine: 10.5 ug/mL

## 2023-08-15 LAB — BMP8+ANION GAP
Anion Gap: 17 mmol/L (ref 10.0–18.0)
BUN/Creatinine Ratio: 16 (ref 10–24)
BUN: 19 mg/dL (ref 8–27)
CO2: 23 mmol/L (ref 20–29)
Calcium: 9.6 mg/dL (ref 8.6–10.2)
Chloride: 99 mmol/L (ref 96–106)
Creatinine, Ser: 1.16 mg/dL (ref 0.76–1.27)
Glucose: 101 mg/dL — ABNORMAL HIGH (ref 70–99)
Potassium: 4 mmol/L (ref 3.5–5.2)
Sodium: 139 mmol/L (ref 134–144)
eGFR: 70 mL/min/{1.73_m2} (ref >59)

## 2023-08-29 ENCOUNTER — Other Ambulatory Visit: Payer: Self-pay

## 2023-08-29 DIAGNOSIS — I10 Essential (primary) hypertension: Secondary | ICD-10-CM

## 2023-08-29 MED ORDER — HYDROCHLOROTHIAZIDE 12.5 MG PO TABS: 12.5000 mg | ORAL_TABLET | Freq: Two times a day (BID) | ORAL | 1 refills | Status: DC

## 2023-08-29 MED ORDER — HYDROCHLOROTHIAZIDE 12.5 MG PO TABS: 12.5000 mg | ORAL_TABLET | Freq: Every day | ORAL | 1 refills | Status: DC

## 2023-08-29 NOTE — Progress Notes (Signed)
Internal Medicine Clinic Attending  I was physically present during the key portions of the resident provided service and participated in the medical decision making of patient's management care. I reviewed pertinent patient test results.  The assessment, diagnosis, and plan were formulated together and I agree with the documentation in the resident's note.  Williams, Julie Anne, MD  

## 2023-08-29 NOTE — Addendum Note (Signed)
Addended by: Jeral Pinch on: 08/29/2023 10:56 AM   Modules accepted: Orders

## 2023-08-29 NOTE — Addendum Note (Signed)
Addended by: Maura Crandall on: 08/29/2023 10:50 AM   Modules accepted: Orders

## 2023-09-11 ENCOUNTER — Encounter: Payer: Self-pay | Admitting: Student

## 2023-09-11 ENCOUNTER — Ambulatory Visit: Payer: 59 | Admitting: Student

## 2023-09-11 VITALS — BP 159/63 | HR 81 | Temp 98.2°F | Ht 64.0 in | Wt 173.4 lb

## 2023-09-11 DIAGNOSIS — J189 Pneumonia, unspecified organism: Secondary | ICD-10-CM | POA: Insufficient documentation

## 2023-09-11 DIAGNOSIS — J441 Chronic obstructive pulmonary disease with (acute) exacerbation: Secondary | ICD-10-CM

## 2023-09-11 MED ORDER — PREDNISONE 20 MG PO TABS: 40.0000 mg | ORAL_TABLET | Freq: Every day | ORAL | 0 refills | Status: AC

## 2023-09-11 MED ORDER — AZITHROMYCIN 250 MG PO TABS: ORAL_TABLET | ORAL | 0 refills | Status: DC

## 2023-09-11 NOTE — Assessment & Plan Note (Signed)
 He reports 1 week history of dry cough, nasal congestion, sore throat, and exertional shortness of breath.  Patient endorses sick contacts at work.He has tried Robitussin, which only helped somewhat. He has a history of COPD, denies  increase in sputum or color changes.  He has been using Symbicort /albuterol  as prescribed.  No fevers, chills, nausea, vomiting or diarrhea.  He has elevated blood pressure, 159/63, otherwise afebrile, satting 98 on room air.  On my exam, he has mild wheezes in the lower lung fields, crackles in the left lower lung.  Possible differential for patient's presentation includes but not limited to pneumonia, COPD exacerbation, other flulike illness.  High suspicion for community-acquired pneumonia, given the focal consolidation on physical exam.  Patient has an allergy to Augmentin , so we will treat with azithromycin  instead. Given the patient also has wheezes on exam, will provide a 5-day course of prednisone  40 mg, to treat/prevent COPD flare. Encouraged against the use of decongestant as that can worsen hypertension.  Plan - Azithromycin  500 mg for 2 days, 250 mg for the next 4 days. - Prednisone  40 mg for 5 days. - If symptoms does not improve, in the next 7 days, patient advised to call clinic.  Could consider adding doxycycline  for possible azithromycin  resistance. - Patient encouraged to wear mask when in open spaces.

## 2023-09-11 NOTE — Patient Instructions (Addendum)
 Thank you, [name] for allowing us  to provide your care today.   Today we discussed :  1.  For your cough and congestion, I have sent azithromycin  to treat for pneumonia.  I have also sent prednisone  40 mg for 5 days, for your COPD.  2. Advised pt to avoid decongestants as that can increase his blood pressure.     I have ordered the following labs for you:  Lab Orders  No laboratory test(s) ordered today      Referrals ordered today:   Referral Orders  No referral(s) requested today     I have ordered the following medication/changed the following medications:   Stop the following medications: Medications Discontinued During This Encounter  Medication Reason   azithromycin  (ZITHROMAX  Z-PAK) 250 MG tablet Reorder     Start the following medications: Meds ordered this encounter  Medications   predniSONE  (DELTASONE ) 20 MG tablet    Sig: Take 2 tablets (40 mg total) by mouth daily with breakfast for 5 days.    Dispense:  10 tablet    Refill:  0   azithromycin  (ZITHROMAX  Z-PAK) 250 MG tablet    Sig: Please take 500 mg (2 tablets) today, then 250 mg (1 tablet) for the next 4 days    Dispense:  6 each    Refill:  0     Follow up: 3 months   Remember: Please remember to call the clinic if you have worsening fever, cough or shortness of breath.  Should you have any questions or concerns please call the internal medicine clinic at [phone].    Missy Sandhoff, MD  [facility]

## 2023-09-11 NOTE — Progress Notes (Signed)
 CC: Cough, congestion and shortness of breath.  HPI:  [name] is a 68 y.o. male living with a history stated below and presents today for cough, congestion and shortness of breath.. Please see problem based assessment and plan for additional details.  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   Dyshidrotic eczema    GERD (gastroesophageal reflux disease)    Hyperlipidemia    Hypertension    Myalgia 11/2007   Likely 2/2 Pravachol    Tobacco abuse     Current Outpatient Medications on File Prior to Visit  Medication Sig Dispense Refill   albuterol  (PROVENTIL  HFA) 108 (90 Base) MCG/ACT inhaler Inhale 2 puffs into the lungs every 6 (six) hours as needed for wheezing or shortness of breath. 18 g 3   amLODipine  (NORVASC ) 10 MG tablet Take 1 tablet (10 mg total) by mouth daily. 90 tablet 2   atorvastatin  (LIPITOR) 40 MG tablet Take one by mouth once daily 90 tablet 2   budesonide -formoterol  (SYMBICORT ) 160-4.5 MCG/ACT inhaler Inhale 2 puffs into the lungs 2 (two) times daily. 1 each 12   cetirizine  (ZYRTEC ) 10 MG tablet Take 1 tablet (10 mg total) by mouth daily. 90 tablet 2   Cholecalciferol  25 MCG (1000 UT) tablet Take 1 tablet (1,000 Units total) by mouth daily. 30 tablet 2   clobetasol  cream (TEMOVATE ) 0.05 % APPLY 1 APPLICATION TOPICALLY TO ITCHY AREA TWICE DAILY 15 g 0   Dextromethorphan  HBr 10 MG/5ML LIQD Take 5 mLs (10 mg total) by mouth every 6 (six) hours as needed. 118 mL 0   fluticasone  (FLONASE ) 50 MCG/ACT nasal spray Use 2 spray(s) in each nostril once daily 16 g 2   gabapentin  (NEURONTIN ) 100 MG capsule Take 1 capsule (100 mg total) by mouth at bedtime. 90 capsule 2   hydrochlorothiazide  (HYDRODIURIL ) 12.5 MG tablet Take 1 tablet (12.5 mg total) by mouth in the morning and at bedtime. 180 tablet 1   meloxicam  (MOBIC )  15 MG tablet Take one tablet by mouth once daily as needed for pain 90 tablet 2   metFORMIN  (GLUCOPHAGE ) 500 MG tablet Take 1 tablet (500 mg total) by mouth 2 (two) times daily with a meal. 120 tablet 5   senna (SENOKOT) 8.6 MG tablet Take 1 tablet (8.6 mg total) by mouth daily. 90 tablet 2   valsartan  (DIOVAN ) 160 MG tablet Take 1 tablet (160 mg total) by mouth daily. 30 tablet 11   No current facility-administered medications on file prior to visit.     Review of Systems: ROS negative except for what is noted on the assessment and plan.  Vitals:   09/11/23 1407  BP: (!) 159/63  Pulse: 81  Temp: 98.2 F (36.8 C)  TempSrc: Oral  SpO2: 98%  Weight: 173 lb 6.4 oz (78.7 kg)  Height: 5' 4 (1.626 m)    Physical Exam: Constitutional: Well appearing, not in acute distress. Cardiovascular: regular rate and rhythm, no m/r/g Pulmonary/Chest: normal work of breathing on room air, mild wheezes in the lower lung fields, crackles/rumble  in the left lower lung   Assessment & Plan:  Patient seen with Dr. Forest Cahill acquired pneumonia of left lower lobe of lung He reports 1 week history of dry cough, nasal congestion, sore throat, and exertional shortness of breath.  Patient endorses sick contacts  at work.He has tried Robitussin, which only helped somewhat. He has a history of COPD, denies  increase in sputum or color changes.  He has been using Symbicort /albuterol  as prescribed.  No fevers, chills, nausea, vomiting or diarrhea.  He has elevated blood pressure, 159/63, otherwise afebrile, satting 98 on room air.  On my exam, he has mild wheezes in the lower lung fields, crackles in the left lower lung.  Possible differential for patient's presentation includes but not limited to pneumonia, COPD exacerbation, other flulike illness.  High suspicion for community-acquired pneumonia, given the focal consolidation on physical exam.  Patient has an allergy to Augmentin , so we will treat  with azithromycin  instead. Given the patient also has wheezes on exam, will provide a 5-day course of prednisone  40 mg, to treat/prevent COPD flare. Encouraged against the use of decongestant as that can worsen hypertension.  Plan - Azithromycin  500 mg for 2 days, 250 mg for the next 4 days. - Prednisone  40 mg for 5 days. - If symptoms does not improve, in the next 7 days, patient advised to call clinic.  Could consider adding doxycycline  for possible azithromycin  resistance. - Patient encouraged to wear mask when in open spaces.   Missy Sandhoff, MD [facility] Internal Medicine, PGY-1 Phone: [phone] Date 09/11/2023 Time 5:10 PM

## 2023-09-12 NOTE — Addendum Note (Signed)
 Addended by: Burnell Blanks on: 09/12/2023 10:29 AM   Modules accepted: Level of Service

## 2023-09-12 NOTE — Progress Notes (Signed)
 Internal Medicine Clinic Attending  I was physically present during the key portions of the resident provided service and participated in the medical decision making of patient's management care. I reviewed pertinent patient test results.  The assessment, diagnosis, and plan were formulated together and I agree with the documentation in the resident's note.  Reymundo Poll, MD

## 2023-11-06 ENCOUNTER — Ambulatory Visit: Payer: 59 | Admitting: Student

## 2023-11-06 ENCOUNTER — Encounter: Payer: Self-pay | Admitting: Student

## 2023-11-06 VITALS — BP 159/70 | HR 78 | Temp 98.1°F | Ht 64.0 in | Wt 173.8 lb

## 2023-11-06 DIAGNOSIS — Z7984 Long term (current) use of oral hypoglycemic drugs: Secondary | ICD-10-CM

## 2023-11-06 DIAGNOSIS — E1169 Type 2 diabetes mellitus with other specified complication: Secondary | ICD-10-CM | POA: Diagnosis not present

## 2023-11-06 DIAGNOSIS — I10 Essential (primary) hypertension: Secondary | ICD-10-CM

## 2023-11-06 DIAGNOSIS — J329 Chronic sinusitis, unspecified: Secondary | ICD-10-CM

## 2023-11-06 DIAGNOSIS — R252 Cramp and spasm: Secondary | ICD-10-CM

## 2023-11-06 DIAGNOSIS — R7303 Prediabetes: Secondary | ICD-10-CM

## 2023-11-06 MED ORDER — VALSARTAN 320 MG PO TABS: 320.0000 mg | ORAL_TABLET | Freq: Every day | ORAL | 3 refills | Status: DC

## 2023-11-06 MED ORDER — FLUTICASONE PROPIONATE 50 MCG/ACT NA SUSP: NASAL | 2 refills | Status: DC

## 2023-11-06 MED ORDER — GABAPENTIN 300 MG PO CAPS: 300.0000 mg | ORAL_CAPSULE | Freq: Every day | ORAL | 2 refills | Status: DC

## 2023-11-06 MED ORDER — PRAVASTATIN SODIUM 40 MG PO TABS: 40.0000 mg | ORAL_TABLET | Freq: Every evening | ORAL | 11 refills | Status: DC

## 2023-11-06 NOTE — Assessment & Plan Note (Addendum)
 BP Readings from Last 3 Encounters:  11/06/23 (!) 152/66  09/11/23 (!) 159/63  08/13/23 136/65    Blood pressure is elevated today.  He is currently taking hydrochlorothiazide 12.5 mg daily, valsartan 160 mg daily, and amlodipine 10 mg daily with no concerns.  Asymptomatic.  I have increased his valsartan to the max dose, 320 mg daily.  We will continue hydrochlorothiazide 12.5 mg daily and amlodipine 10 mg daily.  He will come back in 4 weeks for follow-up appointment at which time we can recheck a BMP and his blood pressure.  I also suspect that his daily Afrin use may be contributing to his hypertension, so I have encouraged him to stop this and use Flonase instead.

## 2023-11-06 NOTE — Progress Notes (Signed)
     CC: Routine Follow Up for HTN, diabetes after last office visit 09/11/2023  HPI:  Samuel Chen is a 68 y.o. male with the PMH listed below who presents to the clinic for follow up. Please see assessment and plan below for further details.  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   Dyshidrotic eczema    GERD (gastroesophageal reflux disease)    Hyperlipidemia    Hypertension    Myalgia 11/2007   Likely 2/2 Pravachol   Tobacco abuse    Review of Systems:   Pertinent items noted in HPI and/or A&P.  Physical Exam:  Vitals:   11/06/23 1324 11/06/23 1420  BP: (!) 152/66 (!) 159/70  Pulse: 86 78  Temp: 98.1 F (36.7 C)   TempSrc: Oral   SpO2: 97%   Weight: 173 lb 12.8 oz (78.8 kg)   Height: 5\' 4"  (1.626 m)    Well-appearing man in no acute distress Heart rate and rhythm regular, no murmurs, euvolemic Mild wheezing in the right lung, otherwise clear to auscultation No focal neurological deficits Normal mood and affect No apparent skin changes  Assessment & Plan:   Essential hypertension BP Readings from Last 3 Encounters:  11/06/23 (!) 152/66  09/11/23 (!) 159/63  08/13/23 136/65    Blood pressure is elevated today.  He is currently taking hydrochlorothiazide 12.5 mg daily, valsartan 160 mg daily, and amlodipine 10 mg daily with no concerns.  Asymptomatic.  I have increased his valsartan to the max dose, 320 mg daily.  We will continue hydrochlorothiazide 12.5 mg daily and amlodipine 10 mg daily.  He will come back in 4 weeks for follow-up appointment at which time we can recheck a BMP and his blood pressure.  I also suspect that his daily Afrin use may be contributing to his hypertension, so I have encouraged him to stop this and use Flonase instead.  Muscle cramps He says his muscle cramps got a lot better after  stopping the atorvastatin, and then worsened again when he tried to restart it.  We will try different statin.  I have sent in pravastatin to his pharmacy.  He was on gabapentin 100 mg nightly as well, which is not providing him much relief.  I have increased this to 300 mg nightly.  We will reevaluate these medications at his follow-up visit.  Type 2 diabetes mellitus with other specified complication (HCC) Prediabetic.  Currently taking metformin 500 mg twice a day.  No new polyuria, polydipsia, or polyneuropathy.  It is too soon to recheck hemoglobin A1c, but we can do so at his next follow-up visit.  Will continue his metformin as it is for now.  He may benefit from an increase in the metformin dose at his next visit.    Patient discussed with Dr. Synthia Innocent, MD [facility] Internal Medicine Resident PGY-1 Clinic Phone: [phone] Pager: [phone]

## 2023-11-06 NOTE — Assessment & Plan Note (Signed)
 Prediabetic.  Currently taking metformin 500 mg twice a day.  No new polyuria, polydipsia, or polyneuropathy.  It is too soon to recheck hemoglobin A1c, but we can do so at his next follow-up visit.  Will continue his metformin as it is for now.  He may benefit from an increase in the metformin dose at his next visit.

## 2023-11-06 NOTE — Patient Instructions (Addendum)
 Thank you, [name] for allowing [place] to provide your care today.  I have sent in a different kind of cholesterol medicine called pravastatin to your pharmacy.  Please take this once nightly.  Please stop using the Afrin, and use Flonase once in the morning and once at night.  I have sent a refill to your pharmacy.  I have increased your valsartan dose since your blood pressure was elevated. We will continue your blood pressure medications as prescribed including hydrochlorothiazide and amlodipine. We can also continue your metformin twice a day.  I have ordered the following medication/changed the following medications:   Start the following medications: Meds ordered this encounter  Medications   gabapentin (NEURONTIN) 300 MG capsule    Sig: Take 1 capsule (300 mg total) by mouth at bedtime.    Dispense:  30 capsule    Refill:  2   fluticasone (FLONASE) 50 MCG/ACT nasal spray    Sig: Use 2 spray(s) in each nostril once daily    Dispense:  16 g    Refill:  2   pravastatin (PRAVACHOL) 40 MG tablet    Sig: Take 1 tablet (40 mg total) by mouth every evening.    Dispense:  30 tablet    Refill:  11   valsartan (DIOVAN) 320 MG tablet    Sig: Take 1 tablet (320 mg total) by mouth daily.    Dispense:  31 tablet    Refill:  3     Follow up: 3 months   We look forward to seeing you next time. Please call our clinic at [phone] if you have any questions or concerns. The best time to call is Monday-Friday from 9am-4pm, but there is someone available 24/7. If after hours or the weekend, call the main hospital number and ask for the Internal Medicine Resident On-Call. If you need medication refills, please notify your pharmacy one week in advance and they will send [place] a request.   Thank you for trusting me with your care. Wishing you the best!   Annett Fabian, MD [facility]

## 2023-11-06 NOTE — Assessment & Plan Note (Addendum)
 He says his muscle cramps got a lot better after stopping the atorvastatin, and then worsened again when he tried to restart it.  We will try different statin.  I have sent in pravastatin to his pharmacy.  He was on gabapentin 100 mg nightly as well, which is not providing him much relief.  I have increased this to 300 mg nightly.  We will reevaluate these medications at his follow-up visit.

## 2023-11-06 NOTE — Assessment & Plan Note (Deleted)
 Lab Results  Component Value Date   HGBA1C 6.2 (A) 08/13/2023   HGBA1C 6.3 (A) 05/07/2023   HGBA1C 6.6 (H) 01/22/2023    Hemoglobin A1c has been pretty stable over time.  He is only taking metformin 500 mg twice a day.  No concerns with this medication.  No new polyuria, polydipsia, or neuropathy.  We will increase his metformin to 1000 mg twice a day.

## 2023-11-11 NOTE — Progress Notes (Signed)
 Internal Medicine Clinic Attending  Case discussed with the resident at the time of the visit.  We reviewed the resident's history and exam and pertinent patient test results.  I agree with the assessment, diagnosis, and plan of care documented in the resident's note.

## 2023-11-12 ENCOUNTER — Other Ambulatory Visit: Payer: Self-pay | Admitting: Student

## 2023-11-12 DIAGNOSIS — J329 Chronic sinusitis, unspecified: Secondary | ICD-10-CM

## 2023-11-12 MED ORDER — CETIRIZINE HCL 10 MG PO TABS: 10.0000 mg | ORAL_TABLET | Freq: Every day | ORAL | 1 refills | Status: DC

## 2023-11-12 NOTE — Telephone Encounter (Signed)
 Copied from CRM [id number]. Topic: Clinical - Medication Refill >> Nov 12, 2023 12:48 PM Antony Haste wrote: Most Recent Primary Care Visit:  Provider: Annett Fabian  Department: [facility]  Visit Type: OPEN ESTABLISHED  Date: 11/06/2023  Medication: cetirizine (ZYRTEC) 10 MG tablet  Has the patient contacted their pharmacy? Yes, [facility] is requesting this refill. (Agent: If no, request that the patient contact the pharmacy for the refill. If patient does not wish to contact the pharmacy document the reason why and proceed with request.) (Agent: If yes, when and what did the pharmacy advise?)  Is this the correct pharmacy for this prescription? Yes If no, delete pharmacy and type the correct one.  This is the patient's preferred pharmacy:  [facility] [address] [place] - [address] [place] [address] Phone: [phone] Fax: [phone]   Has the prescription been filled recently? No  Is the patient out of the medication? Yes  Has the patient been seen for an appointment in the last year OR does the patient have an upcoming appointment? Yes  Can we respond through MyChart? No, callback preferred.  Agent: Please be advised that Rx refills may take up to 3 business days. We ask that you follow-up with your pharmacy.

## 2023-11-13 ENCOUNTER — Telehealth: Payer: Self-pay | Admitting: *Deleted

## 2023-11-13 DIAGNOSIS — J329 Chronic sinusitis, unspecified: Secondary | ICD-10-CM

## 2023-11-13 NOTE — Telephone Encounter (Signed)
 Copied from CRM [id number]. Topic: Clinical - Prescription Issue >> Nov 13, 2023  9:40 AM Adrianna P wrote: Reason for CRM: He takes 2 pumps of fluticasone (FLONASE) in the mornings and dr wants him to start taking it at night asa well, so he is wanting his prescription doubled so he doesn't run out .

## 2023-11-14 MED ORDER — FLUTICASONE PROPIONATE 50 MCG/ACT NA SUSP: NASAL | 2 refills | Status: DC

## 2023-11-24 ENCOUNTER — Ambulatory Visit: Payer: Self-pay | Admitting: Student

## 2023-11-24 DIAGNOSIS — E785 Hyperlipidemia, unspecified: Secondary | ICD-10-CM

## 2023-11-24 NOTE — Telephone Encounter (Signed)
 This RN made first attempt to contact patient with no answer. A voicemail was left with call back number provided.   Copied from CRM [id number]. Topic: Clinical - Medication Question >> Nov 24, 2023  9:42 AM Dennison Nancy wrote: Reason for CRM: patient called that the a new cholestral medication pravastatin (PRAVACHOL) 40 MG tablet its causing cramping in different parts of his body , the medication is causing more side effects than the other medication he was on before this morning he has cramping real bad in his hands , Patient call back number [phone]

## 2023-11-24 NOTE — Telephone Encounter (Signed)
 This RN made third attempt to contact patient with no answer. A voicemail was left with call back number provided.

## 2023-11-24 NOTE — Telephone Encounter (Signed)
 Patient returning missed call from [facility] nurse.  Reporting cramping on new medication and would like to switch this to something else. Aware message has been sent to office by another triage nurse and to await recommendation. Aware he may receive another call or message in MyChart, he is fine with MyChart messaging. Please follow up with Aneta Mins.  Reason for Disposition  Caller has already spoken with another triager or PCP AND has further questions AND triager able to answer questions.  Protocols used: No Contact or Duplicate Contact Call-A-AH

## 2023-11-24 NOTE — Telephone Encounter (Signed)
 Pt reports he is experiencing increasingly worsening muscle cramping since beginning the Rosuvastatin. Pt notes he is drinking plenty of fluids. Pt would like an alternative medication prescribed as discussed at last OV if pt experienced side effects. Routing to provider for review. Please call pt to advise.  Reason for Disposition  [1] Pharmacy calling with prescription question AND [2] triager unable to answer question  Answer Assessment - Initial Assessment Questions 1. NAME of MEDICINE: "What medicine(s) are you calling about?"     Pravastatin 2. QUESTION: "What is your question?" (e.g., double dose of medicine, side effect)     Pt reports it is causing muscle cramping  3. PRESCRIBER: "Who prescribed the medicine?" Reason: if prescribed by specialist, call should be referred to that group.     Dr. Allena Katz 4. SYMPTOMS: "Do you have any symptoms?" If Yes, ask: "What symptoms are you having?"  "How bad are the symptoms (e.g., mild, moderate, severe)     Muscle cramps  Protocols used: Medication Question Call-A-AH

## 2023-11-24 NOTE — Telephone Encounter (Signed)
 This RN made second attempt to contact patient with no answer. A voicemail was left with call back number provided.

## 2023-11-25 ENCOUNTER — Other Ambulatory Visit: Payer: Self-pay

## 2023-11-25 ENCOUNTER — Telehealth: Payer: Self-pay | Admitting: *Deleted

## 2023-11-25 DIAGNOSIS — L301 Dyshidrosis [pompholyx]: Secondary | ICD-10-CM

## 2023-11-25 MED ORDER — CLOBETASOL PROPIONATE 0.05 % EX CREA
TOPICAL_CREAM | CUTANEOUS | 0 refills | Status: AC
Start: 2023-11-25 — End: ?

## 2023-11-25 NOTE — Telephone Encounter (Signed)
 Call to patient to schedule an appointment.  Unable to keep original offered .  Transferred to front office to schedule appointment.

## 2023-11-25 NOTE — Telephone Encounter (Signed)
 Call from patient stated called on  yesterday.  His new Cholesterol medication is causing him to cramp worse then the original .  Would like to get something else called in as soon as possible,

## 2023-11-27 ENCOUNTER — Encounter: Admitting: Student

## 2023-11-27 MED ORDER — PRAVASTATIN SODIUM 20 MG PO TABS: 20.0000 mg | ORAL_TABLET | Freq: Every day | ORAL | 11 refills | Status: AC

## 2023-11-27 NOTE — Telephone Encounter (Signed)
 Called pt about cholesterol med per Dr Sloan Leiter - no answer; left message to call the office.

## 2023-11-27 NOTE — Telephone Encounter (Addendum)
 Pravastatin 20 mg sent. -Patient does not need to come in to have CK checked as long as his muscle cramps do not worsen dramatically

## 2023-11-27 NOTE — Addendum Note (Signed)
 Addended by: Lucille Passy on: 11/27/2023 04:25 PM   Modules accepted: Orders

## 2023-11-27 NOTE — Telephone Encounter (Signed)
 Pt called and informed of new rx Pravastatin 20 mg.

## 2023-11-27 NOTE — Telephone Encounter (Addendum)
 Pt stated he's willing to try a lower dose of Pravastatin. Send rx to [facility] on [address]. And pt's aware to call back if cramping starts back. Thanks

## 2023-12-02 ENCOUNTER — Encounter: Admitting: Internal Medicine

## 2023-12-10 ENCOUNTER — Encounter: Admitting: Student

## 2024-01-15 ENCOUNTER — Other Ambulatory Visit: Payer: Self-pay | Admitting: Student

## 2024-02-09 ENCOUNTER — Encounter: Payer: Self-pay | Admitting: Student

## 2024-02-09 ENCOUNTER — Ambulatory Visit: Admitting: Student

## 2024-02-09 VITALS — BP 131/74 | HR 78 | Temp 97.5°F | Ht 64.0 in | Wt 167.8 lb

## 2024-02-09 DIAGNOSIS — E119 Type 2 diabetes mellitus without complications: Secondary | ICD-10-CM

## 2024-02-09 DIAGNOSIS — I10 Essential (primary) hypertension: Secondary | ICD-10-CM | POA: Diagnosis not present

## 2024-02-09 DIAGNOSIS — E785 Hyperlipidemia, unspecified: Secondary | ICD-10-CM

## 2024-02-09 DIAGNOSIS — R252 Cramp and spasm: Secondary | ICD-10-CM

## 2024-02-09 DIAGNOSIS — Z7984 Long term (current) use of oral hypoglycemic drugs: Secondary | ICD-10-CM

## 2024-02-09 DIAGNOSIS — N529 Male erectile dysfunction, unspecified: Secondary | ICD-10-CM | POA: Insufficient documentation

## 2024-02-09 DIAGNOSIS — E1169 Type 2 diabetes mellitus with other specified complication: Secondary | ICD-10-CM

## 2024-02-09 DIAGNOSIS — J329 Chronic sinusitis, unspecified: Secondary | ICD-10-CM

## 2024-02-09 LAB — POCT GLYCOSYLATED HEMOGLOBIN (HGB A1C): Hemoglobin A1C: 6.2 % — AB (ref 4.0–5.6)

## 2024-02-09 LAB — GLUCOSE, CAPILLARY: Glucose-Capillary: 126 mg/dL — ABNORMAL HIGH (ref 70–99)

## 2024-02-09 MED ORDER — METFORMIN HCL 1000 MG PO TABS: 1000.0000 mg | ORAL_TABLET | Freq: Two times a day (BID) | ORAL | 3 refills | Status: DC

## 2024-02-09 MED ORDER — TADALAFIL 10 MG PO TABS
10.0000 mg | ORAL_TABLET | Freq: Every day | ORAL | 0 refills | Status: DC | PRN
Start: 2024-02-09 — End: 2024-05-26

## 2024-02-09 MED ORDER — FLUTICASONE PROPIONATE 50 MCG/ACT NA SUSP: NASAL | 2 refills | Status: DC

## 2024-02-09 NOTE — Progress Notes (Signed)
 CC: HTN, diabetes follow up   HPI:  [name] is a 68 y.o. male living with a history stated below and presents today for the chief complaint stated above. Please see problem based assessment and plan for additional details.  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   Dyshidrotic eczema    GERD (gastroesophageal reflux disease)    Hyperlipidemia    Hypertension    Myalgia 11/2007   Likely 2/2 Pravachol    Tobacco abuse     Current Outpatient Medications on File Prior to Visit  Medication Sig Dispense Refill   albuterol  (PROVENTIL  HFA) 108 (90 Base) MCG/ACT inhaler Inhale 2 puffs into the lungs every 6 (six) hours as needed for wheezing or shortness of breath. 18 g 3   amLODipine  (NORVASC ) 10 MG tablet Take 1 tablet (10 mg total) by mouth daily. 90 tablet 2   budesonide -formoterol  (SYMBICORT ) 160-4.5 MCG/ACT inhaler Inhale 2 puffs into the lungs 2 (two) times daily. 1 each 12   cetirizine  (ZYRTEC ) 10 MG tablet Take 1 tablet (10 mg total) by mouth daily. 90 tablet 1   Cholecalciferol  25 MCG (1000 UT) tablet Take 1 tablet (1,000 Units total) by mouth daily. 30 tablet 2   clobetasol  cream (TEMOVATE ) 0.05 % APPLY 1 APPLICATION TOPICALLY TO ITCHY AREA TWICE DAILY 15 g 0   Dextromethorphan  HBr 10 MG/5ML LIQD Take 5 mLs (10 mg total) by mouth every 6 (six) hours as needed. 118 mL 0   gabapentin  (NEURONTIN ) 300 MG capsule Take 1 capsule by mouth at bedtime 30 capsule 0   hydrochlorothiazide  (HYDRODIURIL ) 12.5 MG tablet Take 1 tablet (12.5 mg total) by mouth in the morning and at bedtime. 180 tablet 1   meloxicam  (MOBIC ) 15 MG tablet Take one tablet by mouth once daily as needed for pain 90 tablet 2   pravastatin  (PRAVACHOL ) 20 MG tablet Take 1 tablet (20 mg total) by mouth daily. 30 tablet 11   senna (SENOKOT) 8.6 MG tablet Take 1  tablet (8.6 mg total) by mouth daily. 90 tablet 2   valsartan  (DIOVAN ) 320 MG tablet Take 1 tablet (320 mg total) by mouth daily. 31 tablet 3   No current facility-administered medications on file prior to visit.    Family History  Problem Relation Age of Onset   Heart disease Sister    Heart attack Sister        34-35yo   Heart disease Father     Social History   Socioeconomic History   Marital status: Single    Spouse name: Not on file   Number of children: Not on file   Years of education: Not on file   Highest education level: Not on file  Occupational History   Not on file  Tobacco Use   Smoking status: Former    Current packs/day: 0.00    Average packs/day: 1.5 packs/day for 40.0 years (59.9 ttl pk-yrs)    Types: E-cigarettes, Cigarettes    Start date: 06/03/1983    Quit date: 03/04/2013    Years since quitting: 10.9   Smokeless tobacco: Current   Tobacco comments:    Uses a Vapor.  Substance and Sexual Activity   Alcohol use: Yes    Alcohol/week: 20.0 standard drinks of alcohol    Types: 20 Cans of beer per week  Comment: PER WEEK   Drug use: No   Sexual activity: Not on file  Other Topics Concern   Not on file  Social History Narrative   Financial assistance approved for 100% discount at [facility] and has [facility] card per Samuel Chen October 13,2011 4:21PM   Social Drivers of Health   Financial Resource Strain: Low Risk  (07/18/2022)   Overall Financial Resource Strain (CARDIA)    Difficulty of Paying Living Expenses: Not hard at all  Food Insecurity: No Food Insecurity (07/18/2022)   Hunger Vital Sign    Worried About Running Out of Food in the Last Year: Never true    Ran Out of Food in the Last Year: Never true  Transportation Needs: No Transportation Needs (07/18/2022)   PRAPARE - Administrator, Civil Service (Medical): No    Lack of Transportation (Non-Medical): No  Physical Activity: Sufficiently Active (07/18/2022)   Exercise Vital Sign     Days of Exercise per Week: 6 days    Minutes of Exercise per Session: 150+ min  Stress: No Stress Concern Present (07/18/2022)   [employer] of Occupational Health - Occupational Stress Questionnaire    Feeling of Stress : Not at all  Social Connections: Socially Isolated (07/18/2022)   Social Connection and Isolation Panel [NHANES]    Frequency of Communication with Friends and Family: Once a week    Frequency of Social Gatherings with Friends and Family: Once a week    Attends Religious Services: Never    Database administrator or Organizations: No    Attends Banker Meetings: Never    Marital Status: Divorced  Catering manager Violence: Not At Risk (07/18/2022)   Humiliation, Afraid, Rape, and Kick questionnaire    Fear of Current or Ex-Partner: No    Emotionally Abused: No    Physically Abused: No    Sexually Abused: No   Review of Systems: ROS negative except for what is noted on the assessment and plan.  Vitals:   02/09/24 1331  BP: 131/74  Pulse: 78  Temp: (!) 97.5 F (36.4 C)  TempSrc: Oral  SpO2: 95%  Weight: 167 lb 12.8 oz (76.1 kg)  Height: 5\' 4"  (1.626 m)    Physical Exam: Well-appearing gentleman, sitting in chair, no acute distress Heart rhythm and rate regular, euvolemic Lungs clear to auscultation bilaterally, good air movement, normal respiratory rate and effort No focal neurological deficits Mood and affect normal  Assessment & Plan:   Patient discussed with Dr. Jarvis Mesa  Essential hypertension BP Readings from Last 3 Encounters:  11/06/23 (!) 159/70  09/11/23 (!) 159/63  08/13/23 136/65    His blood pressure is well-controlled today.  He is currently taking hydrochlorothiazide  12.5 mg daily, valsartan  320 mg daily, and amlodipine  10 mg daily.  He is asymptomatic.  Since this regimen is working well for him, we will continue the above medications.  We can recheck a BMP at his next visit in 3 months.  Type 2 diabetes mellitus  with other specified complication [facility] Lab Results  Component Value Date   HGBA1C 6.2 (A) 02/09/2024   HGBA1C 6.2 (A) 08/13/2023   HGBA1C 6.3 (A) 05/07/2023    Hemoglobin A1c today is 6.2%, stable from previously.  He is currently taking metformin  500 mg twice daily.  We will increase this to 1000 mg twice daily.  He denies any new polyuria, polydipsia, or polyneuropathy.  Will follow-up in 3 months to recheck his hemoglobin A1c.  Muscle  cramps His muscle cramps returned with pravastatin , worse than with atorvastatin , but he says he is doing fine now that the pravastatin  dose has been decreased.  We will continue with pravastatin  20 mg daily.  Will recheck his lipids today.  ERECTILE DYSFUNCTION Continues to have erectile dysfunction, no morning erections.  Previously had success with Cialis, so I sent in a refill.   Dorthy Gavia, MD [facility] Internal Medicine, PGY-1 Phone: [phone] Date 02/09/2024 Time 1:58 PM

## 2024-02-09 NOTE — Assessment & Plan Note (Addendum)
 Lab Results  Component Value Date   HGBA1C 6.2 (A) 02/09/2024   HGBA1C 6.2 (A) 08/13/2023   HGBA1C 6.3 (A) 05/07/2023    Hemoglobin A1c today is 6.2%, stable from previously.  He is currently taking metformin  500 mg twice daily.  We will increase this to 1000 mg twice daily.  He denies any new polyuria, polydipsia, or polyneuropathy.  Will follow-up in 3 months to recheck his hemoglobin A1c.

## 2024-02-09 NOTE — Assessment & Plan Note (Addendum)
 BP Readings from Last 3 Encounters:  11/06/23 (!) 159/70  09/11/23 (!) 159/63  08/13/23 136/65    His blood pressure is well-controlled today.  He is currently taking hydrochlorothiazide  12.5 mg daily, valsartan  320 mg daily, and amlodipine  10 mg daily.  He is asymptomatic.  Since this regimen is working well for him, we will continue the above medications.  We can recheck a BMP at his next visit in 3 months.

## 2024-02-09 NOTE — Assessment & Plan Note (Addendum)
 His muscle cramps returned with pravastatin , worse than with atorvastatin , but he says he is doing fine now that the pravastatin  dose has been decreased.  We will continue with pravastatin  20 mg daily.  Will recheck his lipids today.

## 2024-02-09 NOTE — Assessment & Plan Note (Signed)
 Continues to have erectile dysfunction, no morning erections.  Previously had success with Cialis, so I sent in a refill.

## 2024-02-09 NOTE — Patient Instructions (Signed)
 Thank you, Samuel Chen for allowing us  to provide your care today. Today we discussed your diabetes and high blood pressure.    Your blood pressure is well-controlled today.  Will continue current blood pressure medications.  Your hemoglobin A1c is 6.2%, which is the same as your last visit.  We have increased your metformin  to 1000 mg, twice daily.  You can take two 500 mg metformin  tablets in the morning and evening until you run out, then you can pick up this new prescription which is just 1 tablet in the morning and evening.  I have sent in a refill for Cialis for you.  I have ordered the following labs for you:  Lab Orders         Glucose, capillary         Lipid Profile         POC Hbg A1C      I have ordered the following medication/changed the following medications:   Start the following medications: Meds ordered this encounter  Medications   fluticasone  (FLONASE ) 50 MCG/ACT nasal spray    Sig: Use 2 spray(s) in each nostril once daily    Dispense:  16 g    Refill:  2    Ok to refill early if patient runs out   metFORMIN  (GLUCOPHAGE ) 1000 MG tablet    Sig: Take 1 tablet (1,000 mg total) by mouth 2 (two) times daily with a meal.    Dispense:  62 tablet    Refill:  3   tadalafil (CIALIS) 10 MG tablet    Sig: Take 1 tablet (10 mg total) by mouth daily as needed for erectile dysfunction.    Dispense:  30 each    Refill:  0     Follow up: 3 months   We look forward to seeing you next time. Please call our clinic at [phone] if you have any questions or concerns. The best time to call is Monday-Friday from 9am-4pm, but there is someone available 24/7. If after hours or the weekend, call the main hospital number and ask for the Internal Medicine Resident On-Call. If you need medication refills, please notify your pharmacy one week in advance and they will send us  a request.   Thank you for trusting me with your care. Wishing you the best!   Dorthy Gavia,  MD [facility]

## 2024-02-10 ENCOUNTER — Ambulatory Visit: Payer: Self-pay | Admitting: Student

## 2024-02-10 ENCOUNTER — Other Ambulatory Visit: Payer: Self-pay | Admitting: Student

## 2024-02-10 LAB — LIPID PANEL
Chol/HDL Ratio: 3.3 ratio (ref 0.0–5.0)
Cholesterol, Total: 194 mg/dL (ref 100–199)
HDL: 59 mg/dL (ref >39)
LDL Chol Calc (NIH): 95 mg/dL (ref 0–99)
Triglycerides: 237 mg/dL — ABNORMAL HIGH (ref 0–149)
VLDL Cholesterol Cal: 40 mg/dL (ref 5–40)

## 2024-02-10 NOTE — Progress Notes (Signed)
 Internal Medicine Clinic Attending  Case discussed with the resident at the time of the visit.  We reviewed the resident's history and exam and pertinent patient test results.  I agree with the assessment, diagnosis, and plan of care documented in the resident's note.    LDL is 91, which is up from 63 while he was on atorvastatin , but still at goal for primary prevention. Will need to keep an eye on his lipids while on pravastatin , but I agree with continuing this regimen for now.

## 2024-02-24 ENCOUNTER — Other Ambulatory Visit: Payer: Self-pay | Admitting: Student

## 2024-02-24 DIAGNOSIS — I10 Essential (primary) hypertension: Secondary | ICD-10-CM

## 2024-02-25 NOTE — Telephone Encounter (Signed)
 Medication sent to pharmacy

## 2024-02-26 ENCOUNTER — Other Ambulatory Visit: Payer: Self-pay | Admitting: Student

## 2024-02-26 NOTE — Telephone Encounter (Signed)
 Medication sent to pharmacy

## 2024-03-01 ENCOUNTER — Ambulatory Visit: Payer: Self-pay

## 2024-03-01 NOTE — Telephone Encounter (Signed)
 FYI Only or Action Required?: FYI only for provider.  Patient was last seen in primary care on 02/09/2024 by Gregary Sharper, MD. Called Nurse Triage reporting Sinusitis. Symptoms began several days ago. Interventions attempted: Nothing. Symptoms are: productive cough with green mucus, nasal discharge, sinus pain, nasal congestion, chest congestion gradually worsening.  Triage Disposition: See PCP When Office is Open (Within 3 Days)  Patient/caregiver understands and will follow disposition?: Yes                   Copied from CRM [id number]. Topic: Clinical - Red Word Triage >> Mar 01, 2024  8:27 AM Miquel SAILOR wrote: Red Word that prompted transfer to Nurse Triage: Patient Sinus infection since 06/27 chest /Headache/Sore throat worse last 1-2 days Reason for Disposition  Lots of coughing  Answer Assessment - Initial Assessment Questions 1. LOCATION: Where does it hurt?      Around his nose, forehead, temples  2. ONSET: When did the sinus pain start?  (e.g., hours, days)      02/27/24.  3. SEVERITY: How bad is the pain?   (Scale 1-10; mild, moderate or severe)   - MILD (1-3): doesn't interfere with normal activities    - MODERATE (4-7): interferes with normal activities (e.g., work or school) or awakens from sleep   - SEVERE (8-10): excruciating pain and patient unable to do any normal activities        6/10.  4. RECURRENT SYMPTOM: Have you ever had sinus problems before? If Yes, ask: When was the last time? and What happened that time?      Yes, unsure when the last time was.  5. NASAL CONGESTION: Is the nose blocked? If Yes, ask: Can you open it or must you breathe through your mouth?     Yes.  6. NASAL DISCHARGE: Do you have discharge from your nose? If so ask, What color?     Yes, he states he is getting green stuff.  7. FEVER: Do you have a fever? If Yes, ask: What is it, how was it measured, and when did it start?      Unsure.  8. OTHER  SYMPTOMS: Do you have any other symptoms? (e.g., sore throat, cough, earache, difficulty breathing)     Productive cough with green mucus, intermittent wheezing, chest congestion, nasal congestion (causes him a hard time to breath at night). Patient states my chest don't really hurt, I just feel the congestion in my chest.  9. PREGNANCY: Is there any chance you are pregnant? When was your last menstrual period?     N/A.  Protocols used: Sinus Pain or Congestion-A-AH

## 2024-03-02 ENCOUNTER — Other Ambulatory Visit: Payer: Self-pay | Admitting: Student

## 2024-03-02 ENCOUNTER — Ambulatory Visit

## 2024-04-06 ENCOUNTER — Other Ambulatory Visit: Payer: Self-pay | Admitting: Student

## 2024-04-12 ENCOUNTER — Other Ambulatory Visit: Payer: Self-pay | Admitting: *Deleted

## 2024-04-12 ENCOUNTER — Ambulatory Visit
Admission: RE | Admit: 2024-04-12 | Discharge: 2024-04-12 | Disposition: A | Discharge status: Home / Self Care | Source: Ambulatory Visit | Attending: Family Medicine | Admitting: Family Medicine

## 2024-04-12 ENCOUNTER — Ambulatory Visit: Admitting: Family Medicine

## 2024-04-12 VITALS — BP 130/82 | Ht 64.0 in | Wt 160.0 lb

## 2024-04-12 DIAGNOSIS — M25552 Pain in left hip: Secondary | ICD-10-CM

## 2024-04-12 MED ORDER — VALSARTAN 320 MG PO TABS: 320.0000 mg | ORAL_TABLET | Freq: Every day | ORAL | 2 refills | Status: DC

## 2024-04-12 NOTE — Progress Notes (Signed)
 PCP: Tobie Gaines, DO  Subjective:   HPI: Patient is a 68 y.o. male here for evaluation of left hip pain, he was last seen in clinic on 11/05/2016.  Patient reports that he has been experiencing pain in his left hip for the past few months. He was previously diagnosed with bursitis of this hip and received a steroid injection which helped with the pain for a little while many years ago. He currently takes Ibuprofen  but does not find it helpful. He also says that every now and then when he is walking his foot will drop and cause him to drip. 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   Dyshidrotic eczema    GERD (gastroesophageal reflux disease)    Hyperlipidemia    Hypertension    Myalgia 11/2007   Likely 2/2 Pravachol    Tobacco abuse     Current Outpatient Medications on File Prior to Visit  Medication Sig Dispense Refill   albuterol  (PROVENTIL  HFA) 108 (90 Base) MCG/ACT inhaler Inhale 2 puffs into the lungs every 6 (six) hours as needed for wheezing or shortness of breath. 18 g 3   amLODipine  (NORVASC ) 10 MG tablet Take 1 tablet (10 mg total) by mouth daily. 90 tablet 2   budesonide -formoterol  (SYMBICORT ) 160-4.5 MCG/ACT inhaler Inhale 2 puffs into the lungs 2 (two) times daily. 1 each 12   cetirizine  (ZYRTEC ) 10 MG tablet Take 1 tablet (10 mg total) by mouth daily. 90 tablet 1   Cholecalciferol  25 MCG (1000 UT) tablet Take 1 tablet (1,000 Units total) by mouth daily. 30 tablet 2   clobetasol  cream (TEMOVATE ) 0.05 % APPLY 1 APPLICATION TOPICALLY TO ITCHY AREA TWICE DAILY 15 g 0   Dextromethorphan  HBr 10 MG/5ML LIQD Take 5 mLs (10 mg total) by mouth every 6 (six) hours as needed. 118 mL 0   fluticasone  (FLONASE ) 50 MCG/ACT nasal spray Use 2 spray(s) in each  nostril once daily 16 g 2   gabapentin  (NEURONTIN ) 300 MG capsule Take 1 capsule by mouth at bedtime 30 capsule 0   hydrochlorothiazide  (HYDRODIURIL ) 12.5 MG tablet TAKE 1 TABLET BY MOUTH IN THE MORNING AND AT BEDTIME 180 tablet 0   meloxicam  (MOBIC ) 15 MG tablet Take one tablet by mouth once daily as needed for pain 90 tablet 2   metFORMIN  (GLUCOPHAGE ) 1000 MG tablet Take 1 tablet (1,000 mg total) by mouth 2 (two) times daily with a meal. 62 tablet 3   pravastatin  (PRAVACHOL ) 20 MG tablet Take 1 tablet (20 mg total) by mouth daily. 30 tablet 11   senna (SENOKOT) 8.6 MG tablet Take 1 tablet (8.6 mg total) by mouth daily. 90 tablet 2   tadalafil  (CIALIS ) 10 MG tablet Take 1 tablet (10 mg total) by mouth daily as needed for erectile dysfunction. 30 each 0   valsartan  (DIOVAN ) 320 MG tablet Take 1 tablet by mouth once daily 31 tablet 0   No current facility-administered medications on file prior to visit.    Past Surgical History:  Procedure Laterality Date   HAND SURGERY  2002   right hand, tendon repair - tendons damanged at work    Allergies  Allergen Reactions   Acetaminophen     REACTION: (causes liver problems)   Aspirin  REACTION: bleeding/nose bleeds   Augmentin  [Amoxicillin -Pot Clavulanate] Diarrhea    BP 130/82   Ht 5' 4 (1.626 m)   Wt 160 lb (72.6 kg)   BMI 27.46 kg/m       No data to display              No data to display              Objective:  Physical Exam:  Gen: NAD, comfortable in exam room  MSK: Left hip Inspection: No appreciable soft tissue or bony abnormalities. Appears symmetric in comparison with right hip.  Palpation: There is slight tenderness to palpation of the lateral hip. No tenderness to palpation of the anterior hip, SI joint, lumbar spine, or paraspinal muscles.  ROM: FROM  Strength: 5/5 with hip flexion/extension, internal/external rotation, and abduction, weakness with hip adduction Neuro/Vasc: Neurovascularly intact  distally Special Test: Log roll negative, patient with mild pain with both FABIR and FABER   Assessment & Plan:  1. Chronic left hip pain - Discussed with patient that the pain he has been experiencing is different than his previously diagnosed trochanteric bursitis. Recommended patient have x-rays taken of his left hip to evaluate for underlying osteoarthritis as well as rule out avascular necrosis given his previous medical history. Additionally, provided patient with home exercises for strengthening the muscles in his hip to help with recovery. Advised patient to follow-up in 1 month or sooner if he is not improving.   - X-ray of left hip  - Home hip muscle strengthening exercises   - Consider prednisone  dose pack pending x-ray results  - Follow-up in 1 month

## 2024-04-12 NOTE — Patient Instructions (Addendum)
 Your exam today is different than when you had bursitis of your hip. Get x-rays after you leave today of your hip. If these are normal or show only mild arthritis I'd recommend a prednisone  dose pack. Start home exercises as directed every day- these are important for long term relief of your hip pain. Follow up in 1 month but message me sooner if not improving and we'll go over options.

## 2024-04-13 ENCOUNTER — Encounter: Payer: Self-pay | Admitting: Family Medicine

## 2024-04-13 MED ORDER — PREDNISONE 10 MG PO TABS
ORAL_TABLET | ORAL | 0 refills | Status: DC
Start: 2024-04-13 — End: 2024-05-26

## 2024-04-13 NOTE — Addendum Note (Signed)
 Addended by: CLEATRICE HERITAGE R on: 04/13/2024 02:00 PM   Modules accepted: Orders

## 2024-04-20 ENCOUNTER — Ambulatory Visit: Payer: Self-pay | Admitting: Family Medicine

## 2024-04-21 ENCOUNTER — Encounter: Payer: Self-pay | Admitting: Family Medicine

## 2024-04-21 ENCOUNTER — Other Ambulatory Visit: Payer: Self-pay

## 2024-04-21 ENCOUNTER — Ambulatory Visit: Admitting: Family Medicine

## 2024-04-21 VITALS — BP 124/68 | Ht 63.5 in | Wt 160.0 lb

## 2024-04-21 DIAGNOSIS — M25552 Pain in left hip: Secondary | ICD-10-CM

## 2024-04-21 MED ORDER — METHYLPREDNISOLONE ACETATE 80 MG/ML IJ SUSP
80.0000 mg | Freq: Once | INTRAMUSCULAR | Status: AC
  Administered 2024-04-21: 80 mg via INTRA_ARTICULAR

## 2024-04-21 NOTE — Progress Notes (Signed)
 PCP: Tobie Gaines, DO  Subjective:   HPI: Patient is a 68 y.o. male here for left hip pain.  8/11: Patient reports that he has been experiencing pain in his anterior left hip for the past few months radiates from lateral side to medial and in thigh. He was previously diagnosed with bursitis of this hip and received a steroid injection which helped with the pain for a little while many years ago. He currently takes Ibuprofen  but does not find it helpful. He also says that every now and then when he is walking his foot will drop and cause him to drop. He has a history of avascular necrosis of right hip and subsequent replacement.   8/20: Patient returns with continued pain. Prednisone  really didn't help much. Still with pain lateral left hip into thigh.  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   Dyshidrotic eczema    GERD (gastroesophageal reflux disease)    Hyperlipidemia    Hypertension    Myalgia 11/2007   Likely 2/2 Pravachol    Tobacco abuse     Current Outpatient Medications on File Prior to Visit  Medication Sig Dispense Refill   albuterol  (PROVENTIL  HFA) 108 (90 Base) MCG/ACT inhaler Inhale 2 puffs into the lungs every 6 (six) hours as needed for wheezing or shortness of breath. 18 g 3   amLODipine  (NORVASC ) 10 MG tablet Take 1 tablet (10 mg total) by mouth daily. 90 tablet 2   budesonide -formoterol  (SYMBICORT ) 160-4.5 MCG/ACT inhaler Inhale 2 puffs into the lungs 2 (two) times daily. 1 each 12   cetirizine  (ZYRTEC ) 10 MG tablet Take 1 tablet (10 mg total) by mouth daily. 90 tablet 1   Cholecalciferol  25 MCG (1000 UT) tablet Take 1 tablet (1,000 Units total) by mouth daily. 30 tablet 2   clobetasol  cream (TEMOVATE ) 0.05 % APPLY 1 APPLICATION TOPICALLY TO ITCHY AREA TWICE DAILY 15 g 0   Dextromethorphan  HBr 10 MG/5ML LIQD Take 5  mLs (10 mg total) by mouth every 6 (six) hours as needed. 118 mL 0   fluticasone  (FLONASE ) 50 MCG/ACT nasal spray Use 2 spray(s) in each nostril once daily 16 g 2   gabapentin  (NEURONTIN ) 300 MG capsule Take 1 capsule by mouth at bedtime 30 capsule 0   hydrochlorothiazide  (HYDRODIURIL ) 12.5 MG tablet TAKE 1 TABLET BY MOUTH IN THE MORNING AND AT BEDTIME 180 tablet 0   meloxicam  (MOBIC ) 15 MG tablet Take one tablet by mouth once daily as needed for pain 90 tablet 2   metFORMIN  (GLUCOPHAGE ) 1000 MG tablet Take 1 tablet (1,000 mg total) by mouth 2 (two) times daily with a meal. 62 tablet 3   pravastatin  (PRAVACHOL ) 20 MG tablet Take 1 tablet (20 mg total) by mouth daily. 30 tablet 11   predniSONE  (DELTASONE ) 10 MG tablet 6 tabs po day 1, 5 tabs po day 2, 4 tabs po day 3, 3 tabs po day 4, 2 tabs po day 5, 1 tab po day 6 21 tablet 0   senna (SENOKOT) 8.6 MG tablet Take 1 tablet (8.6 mg total) by mouth daily. 90 tablet 2   tadalafil  (CIALIS ) 10 MG tablet Take 1 tablet (10 mg total) by mouth daily as needed for erectile dysfunction. 30 each 0   valsartan  (DIOVAN ) 320 MG tablet Take 1 tablet (320 mg total) by mouth daily.  31 tablet 2   No current facility-administered medications on file prior to visit.    Past Surgical History:  Procedure Laterality Date   HAND SURGERY  2002   right hand, tendon repair - tendons damanged at work    Allergies  Allergen Reactions   Acetaminophen     REACTION: (causes liver problems)   Aspirin     REACTION: bleeding/nose bleeds   Augmentin  [Amoxicillin -Pot Clavulanate] Diarrhea    BP 124/68   Ht 5' 3.5 (1.613 m)   Wt 160 lb (72.6 kg)   BMI 27.90 kg/m       No data to display              No data to display              Objective:  Physical Exam:  Gen: NAD, comfortable in exam room  Left hip: No deformity. FROM with 3+/5 strength hip abduction. Tenderness to palpation over greater trochanter. NVI distally. Negative logroll.    Assessment & Plan:  1. Left hip pain - no benefit with prednisone  which would be expected if this were radicular pain.  Radiographs of hip without concerning findings.  Will trial trochanteric injection for diagnostic and therapeutic purposes.  Let us  know in a week how he's doing.  If not improving would go ahead with MRI of hip.  After informed written consent timeout was performed, patient was lying on right side on exam table.  Area overlying left trochanteric bursa prepped with alcohol swab then utilizing ultrasound guidance was injected with 4:1 lidocaine : depomedrol 80mg .  Patient tolerated procedure well without immediate complications.

## 2024-04-21 NOTE — Patient Instructions (Signed)
 You were given a greater trochanteric bursa injection today. Call me in a week to let me know how you're doing. If not improving would go ahead with an MRI of this hip.

## 2024-04-27 ENCOUNTER — Encounter: Payer: Self-pay | Admitting: Family Medicine

## 2024-05-06 ENCOUNTER — Encounter: Payer: Self-pay | Admitting: Gastroenterology

## 2024-05-10 ENCOUNTER — Other Ambulatory Visit: Payer: Self-pay | Admitting: Student

## 2024-05-17 ENCOUNTER — Other Ambulatory Visit: Payer: Self-pay

## 2024-05-17 DIAGNOSIS — I10 Essential (primary) hypertension: Secondary | ICD-10-CM

## 2024-05-17 MED ORDER — AMLODIPINE BESYLATE 10 MG PO TABS: 10.0000 mg | ORAL_TABLET | Freq: Every day | ORAL | 2 refills | Status: AC

## 2024-05-17 NOTE — Telephone Encounter (Signed)
 Medication sent to pharmacy

## 2024-05-20 ENCOUNTER — Other Ambulatory Visit: Payer: Self-pay | Admitting: Student

## 2024-05-20 DIAGNOSIS — I10 Essential (primary) hypertension: Secondary | ICD-10-CM

## 2024-05-20 NOTE — Telephone Encounter (Signed)
 Medication sent to pharmacy

## 2024-05-26 ENCOUNTER — Ambulatory Visit: Admitting: Student

## 2024-05-26 ENCOUNTER — Encounter: Payer: Self-pay | Admitting: Student

## 2024-05-26 VITALS — BP 132/73 | HR 89 | Temp 97.8°F | Ht 63.5 in | Wt 167.4 lb

## 2024-05-26 DIAGNOSIS — E1169 Type 2 diabetes mellitus with other specified complication: Secondary | ICD-10-CM

## 2024-05-26 DIAGNOSIS — N529 Male erectile dysfunction, unspecified: Secondary | ICD-10-CM | POA: Diagnosis not present

## 2024-05-26 DIAGNOSIS — I10 Essential (primary) hypertension: Secondary | ICD-10-CM | POA: Diagnosis not present

## 2024-05-26 DIAGNOSIS — Z79899 Other long term (current) drug therapy: Secondary | ICD-10-CM

## 2024-05-26 DIAGNOSIS — E7849 Other hyperlipidemia: Secondary | ICD-10-CM

## 2024-05-26 DIAGNOSIS — Z7984 Long term (current) use of oral hypoglycemic drugs: Secondary | ICD-10-CM

## 2024-05-26 DIAGNOSIS — F17220 Nicotine dependence, chewing tobacco, uncomplicated: Secondary | ICD-10-CM

## 2024-05-26 DIAGNOSIS — Z8249 Family history of ischemic heart disease and other diseases of the circulatory system: Secondary | ICD-10-CM

## 2024-05-26 DIAGNOSIS — Z122 Encounter for screening for malignant neoplasm of respiratory organs: Secondary | ICD-10-CM

## 2024-05-26 LAB — GLUCOSE, CAPILLARY: Glucose-Capillary: 153 mg/dL — ABNORMAL HIGH (ref 70–99)

## 2024-05-26 LAB — POCT GLYCOSYLATED HEMOGLOBIN (HGB A1C): HbA1c, POC (controlled diabetic range): 6 % (ref 0.0–7.0)

## 2024-05-26 MED ORDER — HYDROCHLOROTHIAZIDE 12.5 MG PO TABS: 12.5000 mg | ORAL_TABLET | Freq: Every day | ORAL | 11 refills | Status: DC

## 2024-05-26 MED ORDER — TADALAFIL 10 MG PO TABS: 10.0000 mg | ORAL_TABLET | Freq: Every day | ORAL | 0 refills | Status: AC | PRN

## 2024-05-26 MED ORDER — HYDROCHLOROTHIAZIDE 25 MG PO TABS: 25.0000 mg | ORAL_TABLET | Freq: Every day | ORAL | 2 refills | Status: AC

## 2024-05-26 MED ORDER — GABAPENTIN 300 MG PO CAPS: 300.0000 mg | ORAL_CAPSULE | Freq: Every day | ORAL | 11 refills | Status: AC

## 2024-05-26 NOTE — Assessment & Plan Note (Signed)
 Most recent BMP showing LDL 95.  No history of stroke or MI.  LDL is at goal.  Patient did have elevated triglycerides, but unclear if patient was fasting or not.  No acute concerns at this time.  Plan: - Check lipid panel with patient not fasting, patient was fasting today - Continue pravastatin  20 mg daily

## 2024-05-26 NOTE — Assessment & Plan Note (Addendum)
 Well-controlled diabetes.  A1c today 6.0.  Regimen currently includes metformin  1000 mg twice daily.  Patient has eye exam next year. Patient had foot exam at previous visit.  Patient is well-controlled at this time.  Will continue with current management.  Plan: - Continue metformin  1000 mg twice daily - Check BMP - Patient up-to-date on foot exam and ophthalmology exam - Continue gabapentin  300 mg nightly for neuropathy

## 2024-05-26 NOTE — Progress Notes (Signed)
 CC: Diabetes   HPI:  [name] is a 68 y.o.  male with past medical history of hypertension, diabetes, COPD presents for follow-up.  Please see assessment and plan for full HPI.   COPD: Albuterol , Symbicort  2 puffs twice daily Hypertension: Amlodipine  10 mg daily, HCTZ 25 mg daily, valsartan  320 mg daily Hyperlipidemia.  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   Dyshidrotic eczema    GERD (gastroesophageal reflux disease)    Hyperlipidemia    Hypertension    Myalgia 11/2007   Likely 2/2 Pravachol    Tobacco abuse      Current Outpatient Medications:    albuterol  (PROVENTIL  HFA) 108 (90 Base) MCG/ACT inhaler, Inhale 2 puffs into the lungs every 6 (six) hours as needed for wheezing or shortness of breath., Disp: 18 g, Rfl: 3   amLODipine  (NORVASC ) 10 MG tablet, Take 1 tablet (10 mg total) by mouth daily., Disp: 90 tablet, Rfl: 2   budesonide -formoterol  (SYMBICORT ) 160-4.5 MCG/ACT inhaler, Inhale 2 puffs into the lungs 2 (two) times daily., Disp: 1 each, Rfl: 12   cetirizine  (ZYRTEC ) 10 MG tablet, Take 1 tablet (10 mg total) by mouth daily., Disp: 90 tablet, Rfl: 1   Cholecalciferol  25 MCG (1000 UT) tablet, Take 1 tablet (1,000 Units total) by mouth daily., Disp: 30 tablet, Rfl: 2   clobetasol  cream (TEMOVATE ) 0.05 %, APPLY 1 APPLICATION TOPICALLY TO ITCHY AREA TWICE DAILY, Disp: 15 g, Rfl: 0   fluticasone  (FLONASE ) 50 MCG/ACT nasal spray, Use 2 spray(s) in each nostril once daily, Disp: 16 g, Rfl: 2   gabapentin  (NEURONTIN )  300 MG capsule, Take 1 capsule (300 mg total) by mouth at bedtime., Disp: 30 capsule, Rfl: 11   hydrochlorothiazide  (HYDRODIURIL ) 25 MG tablet, Take 1 tablet (25 mg total) by mouth daily., Disp: 90 tablet, Rfl: 2   meloxicam  (MOBIC ) 15 MG tablet, Take one tablet by mouth once daily as needed for pain, Disp: 90 tablet, Rfl: 2   metFORMIN  (GLUCOPHAGE ) 1000 MG tablet, Take 1 tablet (1,000 mg total) by mouth 2 (two) times daily with a meal., Disp: 62 tablet, Rfl: 3   pravastatin  (PRAVACHOL ) 20 MG tablet, Take 1 tablet (20 mg total) by mouth daily., Disp: 30 tablet, Rfl: 11   senna (SENOKOT) 8.6 MG tablet, Take 1 tablet (8.6 mg total) by mouth daily., Disp: 90 tablet, Rfl: 2   tadalafil  (CIALIS ) 10 MG tablet, Take 1 tablet (10 mg total) by mouth daily as needed for erectile dysfunction., Disp: 30 tablet, Rfl: 0   valsartan  (DIOVAN ) 320 MG tablet, Take 1 tablet (320 mg total) by mouth daily., Disp: 31 tablet, Rfl: 2  Review of Systems:    Negative except for what is stated in HPI  Physical Exam:  Vitals:   05/26/24 1319 05/26/24 1357  BP: 139/78 132/73  Pulse: 94 89  Temp: 97.8 F (36.6 C)   TempSrc: Oral   SpO2: 96%   Weight: 167 lb 6.4 oz (75.9 kg)   Height: 5' 3.5 (1.613 m)    General: Patient is sitting comfortably in the  room  Head: Normocephalic, atraumatic  Cardio: Regular rate and rhythm, no murmurs, rubs or gallops Pulmonary: Clear to ausculation bilaterally with no rales, rhonchi, and crackles   Assessment & Plan:   Assessment & Plan Type 2 diabetes mellitus with other specified complication, without long-term current use of insulin (HCC) Well-controlled diabetes.  A1c today 6.0.  Regimen currently includes metformin  1000 mg twice daily.  Patient has eye exam next year. Patient had foot exam at previous visit.  Patient is well-controlled at this time.  Will continue with current management.  Plan: - Continue metformin  1000 mg twice daily - Check BMP - Patient up-to-date  on foot exam and ophthalmology exam - Continue gabapentin  300 mg nightly for neuropathy Erectile dysfunction, unspecified erectile dysfunction type No acute concerns.  Request refill.  Working well.  No contraindications seen on chart.  Plan: - Refill Cialis  Essential hypertension Blood pressure elevated to the 130s today.  On recheck still elevated in 130s. History of diabetes plan to increase HCTZ to 25 mg daily  Plan: - Increase HCTZ to 25 mg daily - Continue amlodipine  10 mg daily - Continue valsartan  320 Mg Daily - Return with BP log  - Follow up BP  Screening for lung cancer Ordered low-dose CT scan for screening for lung cancer Other hyperlipidemia Most recent BMP showing LDL 95.  No history of stroke or MI.  LDL is at goal.  Patient did have elevated triglycerides, but unclear if patient was fasting or not.  No acute concerns at this time.  Plan: - Check lipid panel with patient not fasting, patient was fasting today - Continue pravastatin  20 mg daily   Patient discussed with Dr. Ronnald Sergeant  Libby Blanch, DO Internal Medicine Resident PGY-3

## 2024-05-26 NOTE — Patient Instructions (Addendum)
 Samuel Chen,Thank you for allowing me to take part in your care today.  Here are your instructions.  1.  Please increase your hydrochlorothiazide  to 25 mg daily.  I have sent in the tablets, these are the 25 mg tablets.  2.  Continue taking all of your medications as prescribed.  3.  Come back in 1 month and we can see how your blood pressure is doing.  4.  Please bring back a blood pressure log.  5.  I have referred you for CT scan for your lungs for lung cancer screening.  Please follow-up in November for a phone call to schedule.  6.  I am checking labs, will call with results  PLEASE BRING YOUR MEDICATIONS TO EVERY APPOINTMENT  Thank you, Dr. Tobie  If you have any other questions please contact the internal medicine clinic at [phone] If it is after hours, please call the [facility] at [phone] and then ask the person who picks up for the resident on call.

## 2024-05-26 NOTE — Assessment & Plan Note (Signed)
 Blood pressure elevated to the 130s today.  On recheck still elevated in 130s. History of diabetes plan to increase HCTZ to 25 mg daily  Plan: - Increase HCTZ to 25 mg daily - Continue amlodipine  10 mg daily - Continue valsartan  320 Mg Daily - Return with BP log  - Follow up BP

## 2024-05-27 ENCOUNTER — Ambulatory Visit: Payer: Self-pay | Admitting: Student

## 2024-05-27 LAB — BASIC METABOLIC PANEL WITH GFR
BUN/Creatinine Ratio: 12 (ref 10–24)
BUN: 19 mg/dL (ref 8–27)
CO2: 22 mmol/L (ref 20–29)
Calcium: 9.8 mg/dL (ref 8.6–10.2)
Chloride: 97 mmol/L (ref 96–106)
Creatinine, Ser: 1.56 mg/dL — ABNORMAL HIGH (ref 0.76–1.27)
Glucose: 80 mg/dL (ref 70–99)
Potassium: 5 mmol/L (ref 3.5–5.2)
Sodium: 136 mmol/L (ref 134–144)
eGFR: 49 mL/min/1.73 — ABNORMAL LOW (ref >59)

## 2024-05-27 NOTE — Progress Notes (Signed)
 Internal Medicine Clinic Attending  Case discussed with the resident at the time of the visit.  We reviewed the resident's history and exam and pertinent patient test results.  I agree with the assessment, diagnosis, and plan of care documented in the resident's note.

## 2024-05-27 NOTE — Addendum Note (Signed)
 Addended by: KARNA FELLOWS on: 05/27/2024 09:51 AM   Modules accepted: Level of Service

## 2024-06-01 ENCOUNTER — Ambulatory Visit (AMBULATORY_SURGERY_CENTER)

## 2024-06-01 ENCOUNTER — Encounter: Payer: Self-pay | Admitting: Gastroenterology

## 2024-06-01 ENCOUNTER — Other Ambulatory Visit: Payer: Self-pay

## 2024-06-01 ENCOUNTER — Telehealth: Payer: Self-pay | Admitting: Student

## 2024-06-01 VITALS — Ht 63.5 in | Wt 165.0 lb

## 2024-06-01 DIAGNOSIS — I10 Essential (primary) hypertension: Secondary | ICD-10-CM

## 2024-06-01 DIAGNOSIS — Z1211 Encounter for screening for malignant neoplasm of colon: Secondary | ICD-10-CM

## 2024-06-01 MED ORDER — NA SULFATE-K SULFATE-MG SULF 17.5-3.13-1.6 GM/177ML PO SOLN: 1.0000 | Freq: Once | ORAL | 0 refills | Status: AC

## 2024-06-01 NOTE — Telephone Encounter (Signed)
Orders placed for future BMP

## 2024-06-01 NOTE — Progress Notes (Signed)
 Denies allergies to eggs or soy products. Denies complication of anesthesia or sedation. Denies use of weight loss medication. Denies use of O2.   Emmi instructions given for colonoscopy.

## 2024-06-03 ENCOUNTER — Other Ambulatory Visit

## 2024-06-03 DIAGNOSIS — I10 Essential (primary) hypertension: Secondary | ICD-10-CM

## 2024-06-04 LAB — BASIC METABOLIC PANEL WITH GFR
BUN/Creatinine Ratio: 14 (ref 10–24)
BUN: 17 mg/dL (ref 8–27)
CO2: 21 mmol/L (ref 20–29)
Calcium: 9.5 mg/dL (ref 8.6–10.2)
Chloride: 99 mmol/L (ref 96–106)
Creatinine, Ser: 1.18 mg/dL (ref 0.76–1.27)
Glucose: 112 mg/dL — ABNORMAL HIGH (ref 70–99)
Potassium: 4.5 mmol/L (ref 3.5–5.2)
Sodium: 137 mmol/L (ref 134–144)
eGFR: 68 mL/min/1.73 (ref >59)

## 2024-06-08 ENCOUNTER — Ambulatory Visit: Payer: Self-pay | Admitting: Student

## 2024-06-10 ENCOUNTER — Other Ambulatory Visit: Payer: Self-pay

## 2024-06-10 DIAGNOSIS — E119 Type 2 diabetes mellitus without complications: Secondary | ICD-10-CM

## 2024-06-10 MED ORDER — METFORMIN HCL 1000 MG PO TABS: 1000.0000 mg | ORAL_TABLET | Freq: Two times a day (BID) | ORAL | 3 refills | Status: AC

## 2024-06-10 NOTE — Telephone Encounter (Signed)
 Medication sent to pharmacy

## 2024-06-15 ENCOUNTER — Encounter: Payer: Self-pay | Admitting: Gastroenterology

## 2024-06-15 ENCOUNTER — Ambulatory Visit (AMBULATORY_SURGERY_CENTER): Admitting: Gastroenterology

## 2024-06-15 VITALS — BP 132/66 | HR 73 | Temp 97.3°F | Resp 17 | Ht 63.5 in | Wt 165.0 lb

## 2024-06-15 DIAGNOSIS — K6289 Other specified diseases of anus and rectum: Secondary | ICD-10-CM

## 2024-06-15 DIAGNOSIS — K635 Polyp of colon: Secondary | ICD-10-CM | POA: Diagnosis not present

## 2024-06-15 DIAGNOSIS — D125 Benign neoplasm of sigmoid colon: Secondary | ICD-10-CM

## 2024-06-15 DIAGNOSIS — Z1211 Encounter for screening for malignant neoplasm of colon: Secondary | ICD-10-CM | POA: Diagnosis present

## 2024-06-15 DIAGNOSIS — D122 Benign neoplasm of ascending colon: Secondary | ICD-10-CM | POA: Diagnosis not present

## 2024-06-15 DIAGNOSIS — K5730 Diverticulosis of large intestine without perforation or abscess without bleeding: Secondary | ICD-10-CM | POA: Diagnosis not present

## 2024-06-15 MED ORDER — SODIUM CHLORIDE 0.9 % IV SOLN: 500.0000 mL | INTRAVENOUS | Status: DC

## 2024-06-15 NOTE — Progress Notes (Signed)
 Called to room to assist during endoscopic procedure.  Patient ID and intended procedure confirmed with present staff. Received instructions for my participation in the procedure from the performing physician.

## 2024-06-15 NOTE — Patient Instructions (Signed)
 Resume previous diet Continue present medications Await pathology results Repeat colonoscopy depending on pathology results, probably 5 years  Handouts/information given for polyps, diverticulosis   YOU HAD AN ENDOSCOPIC PROCEDURE TODAY AT THE [facility]:   Refer to the procedure report that was given to you for any specific questions about what was found during the examination.  If the procedure report does not answer your questions, please call your gastroenterologist to clarify.  If you requested that your care partner not be given the details of your procedure findings, then the procedure report has been included in a sealed envelope for you to review at your convenience later.  YOU SHOULD EXPECT: Some feelings of bloating in the abdomen. Passage of more gas than usual.  Walking can help get rid of the air that was put into your GI tract during the procedure and reduce the bloating. If you had a lower endoscopy (such as a colonoscopy or flexible sigmoidoscopy) you may notice spotting of blood in your stool or on the toilet paper. If you underwent a bowel prep for your procedure, you may not have a normal bowel movement for a few days.  Please Note:  You might notice some irritation and congestion in your nose or some drainage.  This is from the oxygen used during your procedure.  There is no need for concern and it should clear up in a day or so.  SYMPTOMS TO REPORT IMMEDIATELY:  Following lower endoscopy (colonoscopy):  Excessive amounts of blood in the stool  Significant tenderness or worsening of abdominal pains  Swelling of the abdomen that is new, acute  Fever of 100F or higher For urgent or emergent issues, a gastroenterologist can be reached at any hour by calling [phone]. Do not use MyChart messaging for urgent concerns.   DIET:  We do recommend a small meal at first, but then you may proceed to your regular diet.  Drink plenty of fluids but you should avoid  alcoholic beverages for 24 hours.  ACTIVITY:  You should plan to take it easy for the rest of today and you should NOT DRIVE or use heavy machinery until tomorrow (because of the sedation medicines used during the test).    FOLLOW UP: Our staff will call the number listed on your records the next business day following your procedure.  We will call around 7:15- 8:00 am to check on you and address any questions or concerns that you may have regarding the information given to you following your procedure. If we do not reach you, we will leave a message.     If any biopsies were taken you will be contacted by phone or by letter within the next 1-3 weeks.  Please call us  at [phone] if you have not heard about the biopsies in 3 weeks.   SIGNATURES/CONFIDENTIALITY: You and/or your care partner have signed paperwork which will be entered into your electronic medical record.  These signatures attest to the fact that that the information above on your After Visit Summary has been reviewed and is understood.  Full responsibility of the confidentiality of this discharge information lies with you and/or your care-partner.

## 2024-06-15 NOTE — Op Note (Signed)
 [facility] Patient Name: Samuel Chen Procedure Date: 06/15/2024 7:57 AM MRN: [id number] Endoscopist: Victory L. Legrand , MD, [id number] Age: 68 Referring MD:  Date of Birth: 08-02-1956 Gender: Male Account #: [id number] Procedure:                Colonoscopy Indications:              Screening for colorectal malignant neoplasm                           No adenomatous or serrated polyps on 2011                            colonoscopy Medicines:                Monitored Anesthesia Care Procedure:                Pre-Anesthesia Assessment:                           - Prior to the procedure, a History and Physical                            was performed, and patient medications and                            allergies were reviewed. The patient's tolerance of                            previous anesthesia was also reviewed. The risks                            and benefits of the procedure and the sedation                            options and risks were discussed with the patient.                            All questions were answered, and informed consent                            was obtained. Prior Anticoagulants: The patient has                            taken no anticoagulant or antiplatelet agents. ASA                            Grade Assessment: II - A patient with mild systemic                            disease. After reviewing the risks and benefits,                            the patient was deemed in satisfactory condition to  undergo the procedure.                           After obtaining informed consent, the colonoscope                            was passed under direct vision. Throughout the                            procedure, the patient's blood pressure, pulse, and                            oxygen saturations were monitored continuously. The                            Olympus Scope SN [id number] was introduced through the                             anus and advanced to the the cecum, identified by                            appendiceal orifice and ileocecal valve. The                            colonoscopy was performed without difficulty. The                            patient tolerated the procedure well. The quality                            of the bowel preparation was good. The ileocecal                            valve, appendiceal orifice, and rectum were                            photographed. Scope In: 8:05:27 AM Scope Out: 8:21:42 AM Scope Withdrawal Time: 0 hours 13 minutes 36 seconds  Total Procedure Duration: 0 hours 16 minutes 15 seconds  Findings:                 The perianal and digital rectal examinations were                            normal.                           Repeat examination of right colon under NBI                            performed.                           Four sessile and semi-sessile polyps were found in  the sigmoid colon (1) and ascending colon (3). The                            polyps were diminutive in size. These polyps were                            removed with a cold snare. Resection and retrieval                            were complete.                           A few small-mouthed diverticula were found in the                            left colon.                           Anal papilla(e) were hypertrophied.                           The exam was otherwise without abnormality on                            direct and retroflexion views. Complications:            No immediate complications. Estimated Blood Loss:     Estimated blood loss was minimal. Impression:               - Four diminutive polyps in the sigmoid colon and                            in the ascending colon, removed with a cold snare.                            Resected and retrieved.                           - Diverticulosis in the left colon.                            - Anal papilla(e) were hypertrophied.                           - The examination was otherwise normal on direct                            and retroflexion views. Recommendation:           - Patient has a contact number available for                            emergencies. The signs and symptoms of potential                            delayed complications were discussed with the  patient. Return to normal activities tomorrow.                            Written discharge instructions were provided to the                            patient.                           - Resume previous diet.                           - Continue present medications.                           - Await pathology results.                           - Repeat colonoscopy is recommended for                            surveillance. The colonoscopy date will be                            determined after pathology results from today's                            exam become available for review. Yomira Flitton L. Legrand, MD 06/15/2024 8:26:20 AM This report has been signed electronically.

## 2024-06-15 NOTE — Progress Notes (Signed)
 History and Physical:  This patient presents for endoscopic testing for: Encounter Diagnosis  Name Primary?   Special screening for malignant neoplasms, colon Yes   68 year old man here today for screening colonoscopy.  His last colonoscopy was in 2011 by Dr. Jakie, at which time a hyperplastic sigmoid polyp was removed.  Patient denies chronic abdominal pain, rectal bleeding, constipation or diarrhea.    Patient is otherwise without complaints or active issues today.   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   Dyshidrotic eczema    GERD (gastroesophageal reflux disease)    Hyperlipidemia    Hypertension    Myalgia 11/01/2007   Likely 2/2 Pravachol    Tobacco abuse      Past Surgical History: Past Surgical History:  Procedure Laterality Date   HAND SURGERY  09/02/2000   right hand, tendon repair - tendons damanged at work   REPLACEMENT TOTAL HIP W/  RESURFACING IMPLANTS Right 2012    Allergies: Allergies  Allergen Reactions   Acetaminophen Other (See Comments)    REACTION: (causes liver problems)   Aspirin Other (See Comments)    REACTION: bleeding/nose bleeds   Augmentin  [Amoxicillin -Pot Clavulanate] Diarrhea    Outpatient Meds: Current Outpatient Medications  Medication Sig Dispense Refill   albuterol  (PROVENTIL  HFA) 108 (90 Base) MCG/ACT inhaler Inhale 2 puffs into the lungs every 6 (six) hours as needed for wheezing or shortness of breath. 18 g 3   amLODipine  (NORVASC ) 10 MG tablet Take 1 tablet (10 mg total) by mouth daily. 90 tablet 2   budesonide -formoterol  (SYMBICORT ) 160-4.5 MCG/ACT inhaler Inhale 2 puffs into the lungs 2 (two) times daily. 1 each 12   cetirizine  (ZYRTEC ) 10 MG tablet Take 1 tablet (10 mg total) by mouth daily. 90 tablet 1    Cholecalciferol  25 MCG (1000 UT) tablet Take 1 tablet (1,000 Units total) by mouth daily. 30 tablet 2   fluticasone  (FLONASE ) 50 MCG/ACT nasal spray Use 2 spray(s) in each nostril once daily 16 g 2   gabapentin  (NEURONTIN ) 300 MG capsule Take 1 capsule (300 mg total) by mouth at bedtime. 30 capsule 11   hydrochlorothiazide  (HYDRODIURIL ) 25 MG tablet Take 1 tablet (25 mg total) by mouth daily. 90 tablet 2   meloxicam  (MOBIC ) 15 MG tablet Take one tablet by mouth once daily as needed for pain 90 tablet 2   metFORMIN  (GLUCOPHAGE ) 1000 MG tablet Take 1 tablet (1,000 mg total) by mouth 2 (two) times daily with a meal. 62 tablet 3   pravastatin  (PRAVACHOL ) 20 MG tablet Take 1 tablet (20 mg total) by mouth daily. 30 tablet 11   valsartan  (DIOVAN ) 320 MG tablet Take 1 tablet (320 mg total) by mouth daily. 31 tablet 2   clobetasol  cream (TEMOVATE ) 0.05 % APPLY 1 APPLICATION TOPICALLY TO ITCHY AREA TWICE DAILY 15 g 0   senna (SENOKOT) 8.6 MG tablet Take 1 tablet (8.6 mg total) by mouth daily. 90 tablet 2   tadalafil  (CIALIS ) 10 MG tablet Take 1 tablet (10 mg total) by mouth daily as needed for erectile dysfunction. 30 tablet 0   Current Facility-Administered Medications  Medication Dose Route Frequency Provider Last Rate Last Admin   0.9 %  sodium chloride infusion  500 mL Intravenous Continuous Danis, Victory LITTIE MOULD, MD  ___________________________________________________________________ Objective   Exam:  BP 128/65   Pulse 92   Temp (!) 97.3 F (36.3 C) (Temporal)   Ht 5' 3.5 (1.613 m)   Wt 165 lb (74.8 kg)   SpO2 97%   BMI 28.77 kg/m   CV: regular , S1/S2 Resp: clear to auscultation bilaterally, normal RR and effort noted GI: soft, no tenderness, with active bowel sounds.   Assessment: Encounter Diagnosis  Name Primary?   Special screening for malignant neoplasms, colon Yes     Plan: Colonoscopy   The benefits and risks of the planned procedure(s) were described in  detail with the patient or (when appropriate) their health care proxy.  Risks were outlined as including, but not limited to, bleeding, infection, perforation, adverse medication reaction leading to cardiac or pulmonary decompensation, pancreatitis (if ERCP).  The limitation of incomplete mucosal visualization was also discussed.  No guarantees or warranties were given.  The patient is appropriate for an endoscopic procedure in the ambulatory setting.   - Victory Brand, MD

## 2024-06-15 NOTE — Progress Notes (Signed)
 Transferred to PACU via stretcher, arousing, VSS.

## 2024-06-15 NOTE — Progress Notes (Signed)
 Pt's states no medical or surgical changes since previsit or office visit.

## 2024-06-16 ENCOUNTER — Telehealth: Payer: Self-pay

## 2024-06-16 NOTE — Telephone Encounter (Signed)
  Follow up Call-     06/15/2024    7:32 AM  Call back number  Post procedure Call Back phone  # [phone]  Permission to leave phone message Yes     Patient questions:  Do you have a fever, pain , or abdominal swelling? No. Pain Score  0 *  Have you tolerated food without any problems? Yes.    Have you been able to return to your normal activities? Yes.    Do you have any questions about your discharge instructions: Diet   No. Medications  No. Follow up visit  No.  Do you have questions or concerns about your Care? No.  Actions: * If pain score is 4 or above: No action needed, pain <4.

## 2024-06-17 ENCOUNTER — Ambulatory Visit: Payer: Self-pay | Admitting: Gastroenterology

## 2024-06-17 LAB — SURGICAL PATHOLOGY

## 2024-06-22 ENCOUNTER — Ambulatory Visit (HOSPITAL_BASED_OUTPATIENT_CLINIC_OR_DEPARTMENT_OTHER)

## 2024-06-23 ENCOUNTER — Ambulatory Visit: Admitting: Student

## 2024-06-23 VITALS — BP 133/71 | HR 82 | Ht 63.5 in | Wt 167.6 lb

## 2024-06-23 DIAGNOSIS — I10 Essential (primary) hypertension: Secondary | ICD-10-CM

## 2024-06-23 NOTE — Patient Instructions (Signed)
 Thank you so much for coming to the clinic today!   We will not be making any changes today your blood pressure looks great!  If you have any questions please feel free to the call the clinic at anytime at [phone]. It was a pleasure seeing you!  Best, Dr. Kamran Coker

## 2024-06-24 NOTE — Progress Notes (Signed)
 CC: Hypertension follow-up  HPI:  [name] is a 68 y.o. male living with a history stated below and presents today for hypertension follow-up. Please see problem based assessment and plan for additional details.  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   Dyshidrotic eczema    GERD (gastroesophageal reflux disease)    Hyperlipidemia    Hypertension    Myalgia 11/01/2007   Likely 2/2 Pravachol    Tobacco abuse     Current Outpatient Medications on File Prior to Visit  Medication Sig Dispense Refill   albuterol  (PROVENTIL  HFA) 108 (90 Base) MCG/ACT inhaler Inhale 2 puffs into the lungs every 6 (six) hours as needed for wheezing or shortness of breath. 18 g 3   amLODipine  (NORVASC ) 10 MG tablet Take 1 tablet (10 mg total) by mouth daily. 90 tablet 2   budesonide -formoterol  (SYMBICORT ) 160-4.5 MCG/ACT inhaler Inhale 2 puffs into the lungs 2 (two) times daily. 1 each 12   cetirizine  (ZYRTEC ) 10 MG tablet Take 1 tablet (10 mg total) by mouth daily. 90 tablet 1   Cholecalciferol  25 MCG (1000 UT) tablet Take 1 tablet (1,000 Units total) by mouth daily. 30 tablet 2   clobetasol  cream (TEMOVATE ) 0.05 % APPLY 1 APPLICATION TOPICALLY TO ITCHY AREA TWICE DAILY 15 g 0   fluticasone  (FLONASE ) 50 MCG/ACT nasal spray Use 2 spray(s) in each nostril once daily 16 g 2   gabapentin  (NEURONTIN ) 300 MG capsule Take 1 capsule (300 mg total) by mouth at bedtime. 30 capsule 11   hydrochlorothiazide  (HYDRODIURIL ) 25 MG tablet Take 1 tablet (25 mg total) by mouth daily. 90 tablet 2   meloxicam  (MOBIC ) 15 MG tablet Take one tablet by mouth once daily as needed for pain 90 tablet 2   metFORMIN  (GLUCOPHAGE ) 1000 MG tablet Take 1 tablet (1,000 mg total) by mouth 2 (two) times daily with a meal. 62 tablet 3   pravastatin   (PRAVACHOL ) 20 MG tablet Take 1 tablet (20 mg total) by mouth daily. 30 tablet 11   senna (SENOKOT) 8.6 MG tablet Take 1 tablet (8.6 mg total) by mouth daily. 90 tablet 2   tadalafil  (CIALIS ) 10 MG tablet Take 1 tablet (10 mg total) by mouth daily as needed for erectile dysfunction. 30 tablet 0   valsartan  (DIOVAN ) 320 MG tablet Take 1 tablet (320 mg total) by mouth daily. 31 tablet 2   No current facility-administered medications on file prior to visit.    Family History  Problem Relation Age of Onset   Heart disease Father    Heart disease Sister    Heart attack Sister        41-35yo   Colon cancer Neg Hx    Esophageal cancer Neg Hx    Rectal cancer Neg Hx    Stomach cancer Neg Hx     Social History   Socioeconomic History   Marital status: Single    Spouse name: Not on file   Number of children: Not on file   Years of education: Not on file   Highest education level: Not on file  Occupational History   Not on file  Tobacco Use   Smoking status: Former    Current packs/day: 0.00    Average packs/day: 1.5 packs/day for 40.0 years (59.9 ttl  pk-yrs)    Types: E-cigarettes, Cigarettes    Start date: 06/03/1983    Quit date: 03/04/2013    Years since quitting: 11.3   Smokeless tobacco: Current   Tobacco comments:    Uses a Vapor.  Substance and Sexual Activity   Alcohol use: Yes    Alcohol/week: 20.0 standard drinks of alcohol    Types: 20 Cans of beer per week    Comment: daily beer   Drug use: No   Sexual activity: Not on file  Other Topics Concern   Not on file  Social History Narrative   Financial assistance approved for 100% discount at [facility] and has [facility] card per Barnie Potters October 13,2011 4:21PM   Social Drivers of Health   Financial Resource Strain: Low Risk  (07/18/2022)   Overall Financial Resource Strain (CARDIA)    Difficulty of Paying Living Expenses: Not hard at all  Food Insecurity: No Food Insecurity (07/18/2022)   Hunger Vital Sign    Worried  About Running Out of Food in the Last Year: Never true    Ran Out of Food in the Last Year: Never true  Transportation Needs: No Transportation Needs (07/18/2022)   PRAPARE - Administrator, Civil Service (Medical): No    Lack of Transportation (Non-Medical): No  Physical Activity: Sufficiently Active (07/18/2022)   Exercise Vital Sign    Days of Exercise per Week: 6 days    Minutes of Exercise per Session: 150+ min  Stress: No Stress Concern Present (07/18/2022)   [employer] of Occupational Health - Occupational Stress Questionnaire    Feeling of Stress : Not at all  Social Connections: Socially Isolated (07/18/2022)   Social Connection and Isolation Panel    Frequency of Communication with Friends and Family: Once a week    Frequency of Social Gatherings with Friends and Family: Once a week    Attends Religious Services: Never    Database administrator or Organizations: No    Attends Banker Meetings: Never    Marital Status: Divorced  Catering manager Violence: Not At Risk (07/18/2022)   Humiliation, Afraid, Rape, and Kick questionnaire    Fear of Current or Ex-Partner: No    Emotionally Abused: No    Physically Abused: No    Sexually Abused: No    Review of Systems: ROS negative except for what is noted on the assessment and plan.  Vitals:   06/23/24 1329  BP: 133/71  Pulse: 82  Weight: 167 lb 9.6 oz (76 kg)  Height: 5' 3.5 (1.613 m)    Physical Exam: Constitutional: well-appearing male  in no acute distress Cardiovascular: regular rate and rhythm, no m/r/g Pulmonary/Chest: normal work of breathing on room air, lungs clear to auscultation bilaterally Abdominal: soft, non-tender, non-distended  Assessment & Plan:   Essential hypertension Patient presents for follow-up regarding his blood pressure.  He is currently taking hydrochlorothiazide  25 mg, amlodipine  10 mg, and valsartan  320 mg.  His blood pressure has been well-controlled at  home, and also today in clinic with 133/71.  Will not be making any changes to this regimen.  Of note, previously he was taking hydrochlorothiazide  12.5 mg twice a day, and this was consolidated to 25 mg daily at his visit on September 24.   Patient discussed with Dr. Trudy Dirks Bo Rogue, M.D. [facility], PGY-3 Pager: [phone] Date 06/24/2024 Time 9:07 AM

## 2024-06-24 NOTE — Assessment & Plan Note (Signed)
 Patient presents for follow-up regarding his blood pressure.  He is currently taking hydrochlorothiazide  25 mg, amlodipine  10 mg, and valsartan  320 mg.  His blood pressure has been well-controlled at home, and also today in clinic with 133/71.  Will not be making any changes to this regimen.  Of note, previously he was taking hydrochlorothiazide  12.5 mg twice a day, and this was consolidated to 25 mg daily at his visit on September 24.

## 2024-07-05 NOTE — Progress Notes (Signed)
 Internal Medicine Clinic Attending  Case discussed with the resident at the time of the visit.  We reviewed the resident's history and exam and pertinent patient test results.  I agree with the assessment, diagnosis, and plan of care documented in the resident's note.

## 2024-07-09 ENCOUNTER — Other Ambulatory Visit: Payer: Self-pay | Admitting: Student

## 2024-07-09 NOTE — Telephone Encounter (Signed)
 Medication sent to pharmacy

## 2024-07-14 ENCOUNTER — Ambulatory Visit: Admitting: Family Medicine

## 2024-07-14 VITALS — BP 122/70 | Ht 63.5 in | Wt 160.0 lb

## 2024-07-14 DIAGNOSIS — M25552 Pain in left hip: Secondary | ICD-10-CM

## 2024-07-15 NOTE — Progress Notes (Signed)
 PCP: Tobie Gaines, DO  Discussed the use of AI scribe software for clinical note transcription with the patient, who gave verbal consent to proceed.  History of Present Illness Samuel Chen is a 68 year old male who presents with left hip pain radiating to the knee.  Left hip and lower extremity pain - Pain present for three months - Last seen in office on 8/20 - Originates from the left anterior hip and radiates to the knee, without extending beyond the knee - Pain is persistent and worsened by walking or certain movements - No impact on sleep - No associated numbness or tingling - History of AVN of right hip and replacement  Prior treatments and response - Tried meloxicam  - Previous trochanteric bursa injection provided some relief, but pain persisted - Prednisone  did not significantly alleviate symptoms - Done home exercise program   Past Medical History:  Diagnosis Date   Alcohol abuse    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   Dyshidrotic eczema    GERD (gastroesophageal reflux disease)    Hyperlipidemia    Hypertension    Myalgia 11/01/2007   Likely 2/2 Pravachol    Tobacco abuse     Current Outpatient Medications on File Prior to Visit  Medication Sig Dispense Refill   albuterol  (PROVENTIL  HFA) 108 (90 Base) MCG/ACT inhaler Inhale 2 puffs into the lungs every 6 (six) hours as needed for wheezing or shortness of breath. 18 g 3   amLODipine  (NORVASC ) 10 MG tablet Take 1 tablet (10 mg total) by mouth daily. 90 tablet 2   budesonide -formoterol  (SYMBICORT ) 160-4.5 MCG/ACT inhaler Inhale 2 puffs into the lungs 2 (two) times daily. 1 each 12   cetirizine  (ZYRTEC ) 10 MG tablet Take 1 tablet (10 mg total) by mouth daily. 90 tablet 1   Cholecalciferol  25 MCG (1000 UT) tablet Take 1 tablet (1,000 Units total) by mouth daily. 30 tablet 2   clobetasol  cream  (TEMOVATE ) 0.05 % APPLY 1 APPLICATION TOPICALLY TO ITCHY AREA TWICE DAILY 15 g 0   fluticasone  (FLONASE ) 50 MCG/ACT nasal spray Use 2 spray(s) in each nostril once daily 16 g 2   gabapentin  (NEURONTIN ) 300 MG capsule Take 1 capsule (300 mg total) by mouth at bedtime. 30 capsule 11   hydrochlorothiazide  (HYDRODIURIL ) 25 MG tablet Take 1 tablet (25 mg total) by mouth daily. 90 tablet 2   meloxicam  (MOBIC ) 15 MG tablet Take one tablet by mouth once daily as needed for pain 90 tablet 2   metFORMIN  (GLUCOPHAGE ) 1000 MG tablet Take 1 tablet (1,000 mg total) by mouth 2 (two) times daily with a meal. 62 tablet 3   pravastatin  (PRAVACHOL ) 20 MG tablet Take 1 tablet (20 mg total) by mouth daily. 30 tablet 11   senna (SENOKOT) 8.6 MG tablet Take 1 tablet (8.6 mg total) by mouth daily. 90 tablet 2   tadalafil  (CIALIS ) 10 MG tablet Take 1 tablet (10 mg total) by mouth daily as needed for erectile dysfunction. 30 tablet 0   valsartan  (DIOVAN ) 320 MG tablet Take 1 tablet by mouth once daily 31 tablet 0   No current facility-administered medications on file prior to visit.    Past Surgical History:  Procedure Laterality Date   HAND SURGERY  09/02/2000   right hand, tendon repair - tendons damanged at work   REPLACEMENT TOTAL HIP W/  RESURFACING IMPLANTS Right 2012    Allergies  Allergen Reactions   Acetaminophen Other (See Comments)    REACTION: (causes liver problems)   Aspirin Other (See Comments)    REACTION: bleeding/nose bleeds   Augmentin  [Amoxicillin -Pot Clavulanate] Diarrhea    BP 122/70   Ht 5' 3.5 (1.613 m)   Wt 160 lb (72.6 kg)   BMI 27.90 kg/m       No data to display              No data to display              Objective:  Physical Exam:  Gen: NAD, comfortable in exam room  Left hip: No deformity. Full range of motion with 5/5 strength including hip abduction. Mild tenderness to palpation greater trochanter and just posterior to this. Neurovascularly intact  distally. Positive logroll, faber, fadir Assessment & Plan Left hip pain radiating to left knee Chronic left hip pain radiating to the left knee for three months. Pain exacerbated by movement. Minimal relief from prednisone , trochanteric bursa injection, home exercise program over past 3 months. Normal X-rays. MRI needed for further evaluation. - Ordered MRI of the left hip pending insurance approval. - Scheduled follow-up visit to discuss MRI results, virtual or in-person, at no charge.

## 2024-07-28 ENCOUNTER — Ambulatory Visit
Admission: RE | Admit: 2024-07-28 | Discharge: 2024-07-28 | Disposition: A | Discharge status: Home / Self Care | Source: Ambulatory Visit | Attending: Family Medicine | Admitting: Family Medicine

## 2024-07-28 DIAGNOSIS — M25552 Pain in left hip: Secondary | ICD-10-CM

## 2024-08-03 ENCOUNTER — Encounter: Payer: Self-pay | Admitting: Family Medicine

## 2024-08-05 ENCOUNTER — Ambulatory Visit: Payer: Self-pay | Admitting: Family Medicine

## 2024-08-10 ENCOUNTER — Telehealth: Payer: Self-pay | Admitting: *Deleted

## 2024-08-10 NOTE — Telephone Encounter (Signed)
 Ill forward to Dr. Tobie.                      Copied from CRM [id number]. Topic: General - Other >> Aug 10, 2024  3:49 PM [name] wrote: Reason for CRM: Patient called in stating that he just left the surgeon. States he will be having hip surgery and they told him to call his provider's office and inform them that they will be sending over a form that needs to be completed by PCP and returned to them as soon as possible.

## 2024-08-12 ENCOUNTER — Other Ambulatory Visit: Payer: Self-pay | Admitting: Student

## 2024-08-12 NOTE — Telephone Encounter (Signed)
 Medication sent to pharmacy

## 2024-08-13 ENCOUNTER — Telehealth: Payer: Self-pay | Admitting: Pulmonary Disease

## 2024-08-13 NOTE — Telephone Encounter (Signed)
 Fax received from Dr. Toribio Higashi with [facility] to perform a left total hip arthroplasty on patient.  Patient needs surgery clearance. Surgery is pending. Patient was seen on 03/05/23. Office protocol is a risk assessment can be sent to surgeon if patient has been seen in 60 days or less.   Pt needs appt. I called and spoke with him and scheduled him to see Dr. Annella 09/06/24 at 1:30 pm. Routing to clearance pool until visit is complete.

## 2024-08-15 ENCOUNTER — Other Ambulatory Visit: Payer: Self-pay | Admitting: Student

## 2024-08-15 DIAGNOSIS — L301 Dyshidrosis [pompholyx]: Secondary | ICD-10-CM

## 2024-09-06 ENCOUNTER — Ambulatory Visit: Admitting: Pulmonary Disease

## 2024-09-06 ENCOUNTER — Encounter: Payer: Self-pay | Admitting: Pulmonary Disease

## 2024-09-06 VITALS — BP 131/73 | HR 87 | Ht 63.0 in | Wt 170.6 lb

## 2024-09-06 DIAGNOSIS — Z87891 Personal history of nicotine dependence: Secondary | ICD-10-CM | POA: Diagnosis not present

## 2024-09-06 DIAGNOSIS — J439 Emphysema, unspecified: Secondary | ICD-10-CM | POA: Diagnosis not present

## 2024-09-06 DIAGNOSIS — Z01811 Encounter for preprocedural respiratory examination: Secondary | ICD-10-CM | POA: Diagnosis not present

## 2024-09-06 DIAGNOSIS — J4540 Moderate persistent asthma, uncomplicated: Secondary | ICD-10-CM

## 2024-09-06 DIAGNOSIS — Z01818 Encounter for other preprocedural examination: Secondary | ICD-10-CM

## 2024-09-06 DIAGNOSIS — J45909 Unspecified asthma, uncomplicated: Secondary | ICD-10-CM

## 2024-09-06 NOTE — Progress Notes (Deleted)
 "  @Patient  ID: Samuel Chen, male    DOB: 17-Sep-1955, 69 y.o.   MRN: [id number]  Chief Complaint  Patient presents with   Medical Management of Chronic Issues    Pt states up coming surgery on hip needs clearance     Referring provider: Tobie Gaines, DO  HPI:   69 y.o. man with history of asthma whom we are seeing for preoperative evaluation. Most recent note from PCP office x2 reviewed.  Overall doing well.  No exacerbations.  Last seen a year and a half ago.  No issues.  Using Symbicort .  Breztri  was used in the past little more effective but cost prohibitive.  Again doing well.  Has upcoming hip replacement planned.  No date of surgery scheduled yet.  HPI at initial visit: Patient has longstanding history of dyspnea on exertion.  Largely not limited in day-to-day activities by dyspnea.  Has noticed over the last several months mild worsening of dyspnea on exertion.  Worse on inclines or stairs.  Worse when hot.  No time of day when things are better or worse.  No position where things are better or worse.  Other than that he, no seasonal or environmental changes that make things better or worse.  Symptoms have been well controlled on Advair in the past.  Switched to Symbicort  by PCP in the interim since last pulmonary appointment.  Most recent chest imaging 12/2020 low-dose CT lung cancer screening scan reviewed interpreted as clear lungs, mild emphysematous changes with predilection for the apices/upper lobes compared to the rest of the lungs, small left upper lobe nodule.  Report is lung RADS 2 regular 86-month follow-up.  PMH: Hypertension, hyperlipidemia, tobacco abuse in remission, asthma Surgical history: Reviewed, denies any. Family history: He denies any respiratory illnesses in first-degree relatives on review Social history: Former cigarette smoker, 60-pack-year history, continues to vape nicotine  without flavor additives per his report, works for the city of  [employer] / Pulmonary Flowsheets:   ACT:  Asthma Control Test ACT Total Score  09/01/2017  2:00 PM 12  07/01/2017  3:00 PM 12    MMRC: mMRC Dyspnea Scale mMRC Score  03/27/2021  1:37 PM 3    Epworth:      No data to display          Tests:   FENO:  No results found for: NITRICOXIDE  PFT:     No data to display          WALK:     04/27/2013    9:15 AM  SIX MIN WALK  Supplimental Oxygen during Test? (L/min) No  Tech Comments: Lowest o2 sat during walk 94% RA.      Imaging: Personally reviewed and as per EMR  Lab Results: Personally reviewed, no significant elevation of eosinophils in the past CBC    Component Value Date/Time   WBC 6.5 07/09/2021 1423   WBC 7.1 08/16/2015 1523   RBC 4.11 (L) 07/09/2021 1423   RBC 3.89 (L) 08/16/2015 1545   RBC 3.85 (L) 08/16/2015 1523   HGB 13.1 07/09/2021 1423   HCT 37.8 07/09/2021 1423   PLT 140 (L) 07/09/2021 1423   MCV 92 07/09/2021 1423   MCH 31.9 07/09/2021 1423   MCH 31.2 08/16/2015 1523   MCHC 34.7 07/09/2021 1423   MCHC 33.6 08/16/2015 1523   RDW 12.2 07/09/2021 1423   LYMPHSABS 2.0 07/09/2021 1423   MONOABS 0.8 08/16/2015 1545   EOSABS 0.1 07/09/2021 1423  BASOSABS 0.0 07/09/2021 1423    BMET    Component Value Date/Time   NA 137 06/03/2024 1317   K 4.5 06/03/2024 1317   CL 99 06/03/2024 1317   CO2 21 06/03/2024 1317   GLUCOSE 112 (H) 06/03/2024 1317   GLUCOSE 102 (H) 08/16/2015 1523   BUN 17 06/03/2024 1317   CREATININE 1.18 06/03/2024 1317   CREATININE 0.97 04/14/2014 1334   CALCIUM  9.5 06/03/2024 1317   GFRNONAA 72 08/23/2020 1340   GFRNONAA 88 04/14/2014 1334   GFRAA 84 08/23/2020 1340   GFRAA >89 04/14/2014 1334    BNP No results found for: BNP  ProBNP No results found for: PROBNP  Specialty Problems       Pulmonary Problems   Allergic rhinitis   Qualifier: Diagnosis of  By: Marvine  DO, Beth        COPD (chronic obstructive pulmonary  disease)Gold B   COPD with asthmatic bronchitis smoking induced.  Golds stage KATHEE Pintos: 8/ 26:  FeV1 72% FeV1/FVC 68%  Chantix  failure 2012-2013       Epistaxis not due to trauma    Allergies  Allergen Reactions   Acetaminophen Other (See Comments)    REACTION: (causes liver problems)   Aspirin Other (See Comments)    REACTION: bleeding/nose bleeds   Augmentin  [Amoxicillin -Pot Clavulanate] Diarrhea    Immunization History  Administered Date(s) Administered   Hepatitis B 10/23/2006, 11/24/2006, 05/12/2007   INFLUENZA, HIGH DOSE SEASONAL PF 05/22/2024   Influenza Split 06/12/2011, 06/24/2012   Influenza Whole 06/16/2007, 06/22/2010   Influenza, Quadrivalent, Recombinant, Inj, Pf 05/30/2018, 05/12/2022   Influenza,inj,Quad PF,6+ Mos 07/08/2013, 05/18/2014, 06/02/2018   Influenza-Unspecified 06/27/2021   PFIZER Comirnaty(Gray Top)Covid-19 Tri-Sucrose Vaccine 12/23/2020   PFIZER(Purple Top)SARS-COV-2 Vaccination 09/10/2019, 10/04/2019, 12/07/2019, 12/28/2019, 06/30/2020   PNEUMOCOCCAL CONJUGATE-20 03/18/2021   Pneumococcal Polysaccharide-23 03/27/2010, 09/19/2018   Respiratory Syncytial Virus Vaccine,Recomb Aduvanted(Arexvy) 10/12/2022   Td 03/27/2010   Tdap 12/29/2018, 05/13/2020   Zoster Recombinant(Shingrix) 09/19/2018, 11/18/2018   Zoster, Unspecified 06/27/2021    Past Medical History:  Diagnosis Date   Alcohol abuse    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   Dyshidrotic eczema    GERD (gastroesophageal reflux disease)    Hyperlipidemia    Hypertension    Myalgia 11/01/2007   Likely 2/2 Pravachol    Tobacco abuse     Tobacco History: Social History   Tobacco Use  Smoking Status Former   Current packs/day: 0.00   Average packs/day: 1.5 packs/day for 40.0 years (59.9 ttl pk-yrs)   Types: E-cigarettes, Cigarettes   Start date: 06/03/1983    Quit date: 03/04/2013   Years since quitting: 11.5  Smokeless Tobacco Current  Tobacco Comments   Uses a Vapor.   Ready to quit: Not Answered Counseling given: Not Answered Tobacco comments: Uses a Vapor.   Continue to not smoke  Outpatient Encounter Medications as of 09/06/2024  Medication Sig   albuterol  (PROVENTIL  HFA) 108 (90 Base) MCG/ACT inhaler Inhale 2 puffs into the lungs every 6 (six) hours as needed for wheezing or shortness of breath.   amLODipine  (NORVASC ) 10 MG tablet Take 1 tablet (10 mg total) by mouth daily.   budesonide -formoterol  (SYMBICORT ) 160-4.5 MCG/ACT inhaler Inhale 2 puffs into the lungs 2 (two) times daily.   cetirizine  (ZYRTEC ) 10 MG tablet Take 1 tablet (10 mg total) by mouth daily.  Cholecalciferol  25 MCG (1000 UT) tablet Take 1 tablet (1,000 Units total) by mouth daily.   clobetasol  cream (TEMOVATE ) 0.05 % APPLY 1 APPLICATION TOPICALLY TO ITCHY AREA TWICE A DAY   fluticasone  (FLONASE ) 50 MCG/ACT nasal spray Use 2 spray(s) in each nostril once daily   gabapentin  (NEURONTIN ) 300 MG capsule Take 1 capsule (300 mg total) by mouth at bedtime.   hydrochlorothiazide  (HYDRODIURIL ) 25 MG tablet Take 1 tablet (25 mg total) by mouth daily.   meloxicam  (MOBIC ) 15 MG tablet Take one tablet by mouth once daily as needed for pain   metFORMIN  (GLUCOPHAGE ) 1000 MG tablet Take 1 tablet (1,000 mg total) by mouth 2 (two) times daily with a meal.   pravastatin  (PRAVACHOL ) 20 MG tablet Take 1 tablet (20 mg total) by mouth daily.   senna (SENOKOT) 8.6 MG tablet Take 1 tablet (8.6 mg total) by mouth daily.   tadalafil  (CIALIS ) 10 MG tablet Take 1 tablet (10 mg total) by mouth daily as needed for erectile dysfunction.   valsartan  (DIOVAN ) 320 MG tablet Take 1 tablet by mouth once daily   No facility-administered encounter medications on file as of 09/06/2024.     Review of Systems  Review of Systems  N/a Physical Exam  BP 131/73   Pulse 87   Ht 5' 3 (1.6 m) Comment: per  pt  Wt 170 lb 9.6 oz (77.4 kg)   SpO2 95%   BMI 30.22 kg/m   Wt Readings from Last 5 Encounters:  09/06/24 170 lb 9.6 oz (77.4 kg)  07/14/24 160 lb (72.6 kg)  06/23/24 167 lb 9.6 oz (76 kg)  06/15/24 165 lb (74.8 kg)  06/01/24 165 lb (74.8 kg)    BMI Readings from Last 5 Encounters:  09/06/24 30.22 kg/m  07/14/24 27.90 kg/m  06/23/24 29.22 kg/m  06/15/24 28.77 kg/m  06/01/24 28.77 kg/m     Physical Exam General: Sitting up, no distress Eyes: No icterus Neck: No JVP Cardiovascular: Warm, no edema noted Pulmonary: Clear bilaterally, normal work of breathing, on room air Abdomen: Nondistended   Assessment & Plan:   Asthma: Based on history of bronchitic and atopic symptoms.  Previously pretty well controlled on Advair then switch to Symbicort  at a later date.  Overall, doing well on Symbicort .  To continue.   Emphysema: As seen on CT scan.  Related to prior smoking history.  PFTs in the past 2017 and most recently 2018 without fixed obstruction.  Preop evaluation: Pulmonary medicine is not provide preoperative clearance or other preoperative risk assessment.  Based on the ARISCAT model, patient is low at 1.6% risk of postoperative pulmonary complication if duration of surgery is less than 2 hours.  Duration of surgery greater than 2 hours, patient is intermediate at 13.3% risk of postoperative pulmonary complication.  There are no modifiable risk factors prior to surgery. Recommendations: -- Please provide DuoNebs in the preoperative area and in the PACU postoperatively  Return if symptoms worsen or fail to improve.   Donnice JONELLE Beals, MD 09/06/2024     "

## 2024-09-06 NOTE — Progress Notes (Deleted)
 "  @Patient  ID: Samuel Chen, male    DOB: [birth year], 69 y.o.   MRN: [id number]  Chief Complaint  Patient presents with   Medical Management of Chronic Issues    Pt states up coming surgery on hip needs clearance     Referring provider: Tobie Gaines, DO  HPI:   69 y.o. man whom we are seeing in follow up for evaluation of dyspnea on exertion, emphysema, asthma. Most recent note from PCP office x3 reviewed.  Doing ok. No exacerbations since last visit.  Reports adherence to Symbicort .  Prescription of Breztri  never sent in but he did use the samples again.  There is been discrepancies and reports initially some concern for making symptoms worse but sometimes make it better.  He thinks reliably was better with the samples.  Discussed new prescription today.  He would like to have this long-term if cost effective.  HPI at initial visit: Patient has longstanding history of dyspnea on exertion.  Largely not limited in day-to-day activities by dyspnea.  Has noticed over the last several months mild worsening of dyspnea on exertion.  Worse on inclines or stairs.  Worse when hot.  No time of day when things are better or worse.  No position where things are better or worse.  Other than that he, no seasonal or environmental changes that make things better or worse.  Symptoms have been well controlled on Advair in the past.  Switched to Symbicort  by PCP in the interim since last pulmonary appointment.  Most recent chest imaging 12/2020 low-dose CT lung cancer screening scan reviewed interpreted as clear lungs, mild emphysematous changes with predilection for the apices/upper lobes compared to the rest of the lungs, small left upper lobe nodule.  Report is lung RADS 2 regular 95-month follow-up.  PMH: Hypertension, hyperlipidemia, tobacco abuse in remission, asthma Surgical history: Reviewed, denies any. Family history: He denies any respiratory illnesses in first-degree relatives on review Social  history: Former cigarette smoker, 60-pack-year history, continues to vape nicotine  without flavor additives per his report, works for the [employer] of [employer] / Pulmonary Flowsheets:   ACT:  Asthma Control Test ACT Total Score  09/01/2017  2:00 PM 12  07/01/2017  3:00 PM 12    MMRC: mMRC Dyspnea Scale mMRC Score  03/27/2021  1:37 PM 3    Epworth:      No data to display          Tests:   FENO:  No results found for: NITRICOXIDE  PFT:     No data to display          WALK:     04/27/2013    9:15 AM  SIX MIN WALK  Supplimental Oxygen during Test? (L/min) No  Tech Comments: Lowest o2 sat during walk 94% RA.      Imaging: Personally reviewed and as per EMR  Lab Results: Personally reviewed, no significant elevation of eosinophils in the past CBC    Component Value Date/Time   WBC 6.5 07/09/2021 1423   WBC 7.1 08/16/2015 1523   RBC 4.11 (L) 07/09/2021 1423   RBC 3.89 (L) 08/16/2015 1545   RBC 3.85 (L) 08/16/2015 1523   HGB 13.1 07/09/2021 1423   HCT 37.8 07/09/2021 1423   PLT 140 (L) 07/09/2021 1423   MCV 92 07/09/2021 1423   MCH 31.9 07/09/2021 1423   MCH 31.2 08/16/2015 1523   MCHC 34.7 07/09/2021 1423   MCHC 33.6 08/16/2015 1523  RDW 12.2 07/09/2021 1423   LYMPHSABS 2.0 07/09/2021 1423   MONOABS 0.8 08/16/2015 1545   EOSABS 0.1 07/09/2021 1423   BASOSABS 0.0 07/09/2021 1423    BMET    Component Value Date/Time   NA 137 06/03/2024 1317   K 4.5 06/03/2024 1317   CL 99 06/03/2024 1317   CO2 21 06/03/2024 1317   GLUCOSE 112 (H) 06/03/2024 1317   GLUCOSE 102 (H) 08/16/2015 1523   BUN 17 06/03/2024 1317   CREATININE 1.18 06/03/2024 1317   CREATININE 0.97 04/14/2014 1334   CALCIUM  9.5 06/03/2024 1317   GFRNONAA 72 08/23/2020 1340   GFRNONAA 88 04/14/2014 1334   GFRAA 84 08/23/2020 1340   GFRAA >89 04/14/2014 1334    BNP No results found for: BNP  ProBNP No results found for: PROBNP  Specialty Problems        Pulmonary Problems   Allergic rhinitis   Qualifier: Diagnosis of  By: Marvine  DO, Beth        COPD (chronic obstructive pulmonary disease)Gold B   COPD with asthmatic bronchitis smoking induced.  Golds stage KATHEE Pintos: 8/ 26:  FeV1 72% FeV1/FVC 68%  Chantix  failure 2012-2013       Epistaxis not due to trauma    Allergies  Allergen Reactions   Acetaminophen Other (See Comments)    REACTION: (causes liver problems)   Aspirin Other (See Comments)    REACTION: bleeding/nose bleeds   Augmentin  [Amoxicillin -Pot Clavulanate] Diarrhea    Immunization History  Administered Date(s) Administered   Hepatitis B 10/23/2006, 11/24/2006, 05/12/2007   INFLUENZA, HIGH DOSE SEASONAL PF 05/22/2024   Influenza Split 06/12/2011, 06/24/2012   Influenza Whole 06/16/2007, 06/22/2010   Influenza, Quadrivalent, Recombinant, Inj, Pf 05/30/2018, 05/12/2022   Influenza,inj,Quad PF,6+ Mos 07/08/2013, 05/18/2014, 06/02/2018   Influenza-Unspecified 06/27/2021   PFIZER Comirnaty(Gray Top)Covid-19 Tri-Sucrose Vaccine 12/23/2020   PFIZER(Purple Top)SARS-COV-2 Vaccination 09/10/2019, 10/04/2019, 12/07/2019, 12/28/2019, 06/30/2020   PNEUMOCOCCAL CONJUGATE-20 03/18/2021   Pneumococcal Polysaccharide-23 03/27/2010, 09/19/2018   Respiratory Syncytial Virus Vaccine,Recomb Aduvanted(Arexvy) 10/12/2022   Td 03/27/2010   Tdap 12/29/2018, 05/13/2020   Zoster Recombinant(Shingrix) 09/19/2018, 11/18/2018   Zoster, Unspecified 06/27/2021    Past Medical History:  Diagnosis Date   Alcohol abuse    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   Dyshidrotic eczema    GERD (gastroesophageal reflux disease)    Hyperlipidemia    Hypertension    Myalgia 11/01/2007   Likely 2/2 Pravachol    Tobacco abuse     Tobacco History: Social History   Tobacco Use  Smoking Status Former   Current  packs/day: 0.00   Average packs/day: 1.5 packs/day for 40.0 years (59.9 ttl pk-yrs)   Types: E-cigarettes, Cigarettes   Start date: 06/03/1983   Quit date: 03/04/2013   Years since quitting: 11.5  Smokeless Tobacco Current  Tobacco Comments   Uses a Vapor.   Ready to quit: Not Answered Counseling given: Not Answered Tobacco comments: Uses a Vapor.   Continue to not smoke  Outpatient Encounter Medications as of 09/06/2024  Medication Sig   albuterol  (PROVENTIL  HFA) 108 (90 Base) MCG/ACT inhaler Inhale 2 puffs into the lungs every 6 (six) hours as needed for wheezing or shortness of breath.   amLODipine  (NORVASC ) 10 MG tablet Take 1 tablet (10 mg total) by mouth daily.   budesonide -formoterol  (SYMBICORT ) 160-4.5 MCG/ACT inhaler Inhale 2  puffs into the lungs 2 (two) times daily.   cetirizine  (ZYRTEC ) 10 MG tablet Take 1 tablet (10 mg total) by mouth daily.   Cholecalciferol  25 MCG (1000 UT) tablet Take 1 tablet (1,000 Units total) by mouth daily.   clobetasol  cream (TEMOVATE ) 0.05 % APPLY 1 APPLICATION TOPICALLY TO ITCHY AREA TWICE A DAY   fluticasone  (FLONASE ) 50 MCG/ACT nasal spray Use 2 spray(s) in each nostril once daily   gabapentin  (NEURONTIN ) 300 MG capsule Take 1 capsule (300 mg total) by mouth at bedtime.   hydrochlorothiazide  (HYDRODIURIL ) 25 MG tablet Take 1 tablet (25 mg total) by mouth daily.   meloxicam  (MOBIC ) 15 MG tablet Take one tablet by mouth once daily as needed for pain   metFORMIN  (GLUCOPHAGE ) 1000 MG tablet Take 1 tablet (1,000 mg total) by mouth 2 (two) times daily with a meal.   pravastatin  (PRAVACHOL ) 20 MG tablet Take 1 tablet (20 mg total) by mouth daily.   senna (SENOKOT) 8.6 MG tablet Take 1 tablet (8.6 mg total) by mouth daily.   tadalafil  (CIALIS ) 10 MG tablet Take 1 tablet (10 mg total) by mouth daily as needed for erectile dysfunction.   valsartan  (DIOVAN ) 320 MG tablet Take 1 tablet by mouth once daily   No facility-administered encounter medications on  file as of 09/06/2024.     Review of Systems  Review of Systems  N/a Physical Exam  BP 131/73   Pulse 87   Ht 5' 3 (1.6 m) Comment: per pt  Wt 170 lb 9.6 oz (77.4 kg)   SpO2 95%   BMI 30.22 kg/m   Wt Readings from Last 5 Encounters:  09/06/24 170 lb 9.6 oz (77.4 kg)  07/14/24 160 lb (72.6 kg)  06/23/24 167 lb 9.6 oz (76 kg)  06/15/24 165 lb (74.8 kg)  06/01/24 165 lb (74.8 kg)    BMI Readings from Last 5 Encounters:  09/06/24 30.22 kg/m  07/14/24 27.90 kg/m  06/23/24 29.22 kg/m  06/15/24 28.77 kg/m  06/01/24 28.77 kg/m     Physical Exam General: Sitting in chair, no acute distress Eyes: EOMI, no icterus Neck: Supple, no JVP Cardiovascular: Regular rate and rhythm, no murmur appreciated Pulmonary: Normal work of breathing, on room air, a bit distant but clear to auscultation bilaterally Abdomen: Nondistended, bowel sounds present MSK: No synovitis, no joint effusion Neuro: Normal gait, no weakness Psych: Normal mood, full affect  Assessment & Plan:   Asthma: Based on history of bronchitic and atopic symptoms.  Previously pretty well controlled on Advair then switch to Symbicort  at a later date.  Overall doing okay on Symbicort .  Exacerbation 09/2021.  New prescription for Breztri , historically cost prohibitive.  In the past symptoms improved on Breztri  compared to Symbicort .  Will try again, new prescription today.   Emphysema: As seen on CT scan.  Related to prior smoking history.  PFTs in the past 2017 and most recently 2018 without fixed obstruction.  No follow-ups on file.   Donnice JONELLE Beals, MD 09/06/2024     "

## 2024-09-06 NOTE — Progress Notes (Signed)
 "  @Patient  ID: Samuel Chen, male    DOB: 17-Sep-1955, 69 y.o.   MRN: [id number]  Chief Complaint  Patient presents with   Medical Management of Chronic Issues    Pt states up coming surgery on hip needs clearance     Referring provider: Tobie Gaines, DO  HPI:   69 y.o. man with history of asthma whom we are seeing for preoperative evaluation. Most recent note from PCP office x2 reviewed.  Overall doing well.  No exacerbations.  Last seen a year and a half ago.  No issues.  Using Symbicort .  Breztri  was used in the past little more effective but cost prohibitive.  Again doing well.  Has upcoming hip replacement planned.  No date of surgery scheduled yet.  HPI at initial visit: Patient has longstanding history of dyspnea on exertion.  Largely not limited in day-to-day activities by dyspnea.  Has noticed over the last several months mild worsening of dyspnea on exertion.  Worse on inclines or stairs.  Worse when hot.  No time of day when things are better or worse.  No position where things are better or worse.  Other than that he, no seasonal or environmental changes that make things better or worse.  Symptoms have been well controlled on Advair in the past.  Switched to Symbicort  by PCP in the interim since last pulmonary appointment.  Most recent chest imaging 12/2020 low-dose CT lung cancer screening scan reviewed interpreted as clear lungs, mild emphysematous changes with predilection for the apices/upper lobes compared to the rest of the lungs, small left upper lobe nodule.  Report is lung RADS 2 regular 67-month follow-up.  PMH: Hypertension, hyperlipidemia, tobacco abuse in remission, asthma Surgical history: Reviewed, denies any. Family history: He denies any respiratory illnesses in first-degree relatives on review Social history: Former cigarette smoker, 60-pack-year history, continues to vape nicotine  without flavor additives per his report, works for the city of  [employer] / Pulmonary Flowsheets:   ACT:  Asthma Control Test ACT Total Score  09/01/2017  2:00 PM 12  07/01/2017  3:00 PM 12    MMRC: mMRC Dyspnea Scale mMRC Score  03/27/2021  1:37 PM 3    Epworth:      No data to display          Tests:   FENO:  No results found for: NITRICOXIDE  PFT:     No data to display          WALK:     04/27/2013    9:15 AM  SIX MIN WALK  Supplimental Oxygen during Test? (L/min) No  Tech Comments: Lowest o2 sat during walk 94% RA.      Imaging: Personally reviewed and as per EMR  Lab Results: Personally reviewed, no significant elevation of eosinophils in the past CBC    Component Value Date/Time   WBC 6.5 07/09/2021 1423   WBC 7.1 08/16/2015 1523   RBC 4.11 (L) 07/09/2021 1423   RBC 3.89 (L) 08/16/2015 1545   RBC 3.85 (L) 08/16/2015 1523   HGB 13.1 07/09/2021 1423   HCT 37.8 07/09/2021 1423   PLT 140 (L) 07/09/2021 1423   MCV 92 07/09/2021 1423   MCH 31.9 07/09/2021 1423   MCH 31.2 08/16/2015 1523   MCHC 34.7 07/09/2021 1423   MCHC 33.6 08/16/2015 1523   RDW 12.2 07/09/2021 1423   LYMPHSABS 2.0 07/09/2021 1423   MONOABS 0.8 08/16/2015 1545   EOSABS 0.1 07/09/2021 1423  BASOSABS 0.0 07/09/2021 1423    BMET    Component Value Date/Time   NA 137 06/03/2024 1317   K 4.5 06/03/2024 1317   CL 99 06/03/2024 1317   CO2 21 06/03/2024 1317   GLUCOSE 112 (H) 06/03/2024 1317   GLUCOSE 102 (H) 08/16/2015 1523   BUN 17 06/03/2024 1317   CREATININE 1.18 06/03/2024 1317   CREATININE 0.97 04/14/2014 1334   CALCIUM  9.5 06/03/2024 1317   GFRNONAA 72 08/23/2020 1340   GFRNONAA 88 04/14/2014 1334   GFRAA 84 08/23/2020 1340   GFRAA >89 04/14/2014 1334    BNP No results found for: BNP  ProBNP No results found for: PROBNP  Specialty Problems       Pulmonary Problems   Allergic rhinitis   Qualifier: Diagnosis of  By: Marvine  DO, Beth        COPD (chronic obstructive pulmonary  disease)Gold B   COPD with asthmatic bronchitis smoking induced.  Golds stage KATHEE Pintos: 8/ 26:  FeV1 72% FeV1/FVC 68%  Chantix  failure 2012-2013       Epistaxis not due to trauma    Allergies  Allergen Reactions   Acetaminophen Other (See Comments)    REACTION: (causes liver problems)   Aspirin Other (See Comments)    REACTION: bleeding/nose bleeds   Augmentin  [Amoxicillin -Pot Clavulanate] Diarrhea    Immunization History  Administered Date(s) Administered   Hepatitis B 10/23/2006, 11/24/2006, 05/12/2007   INFLUENZA, HIGH DOSE SEASONAL PF 05/22/2024   Influenza Split 06/12/2011, 06/24/2012   Influenza Whole 06/16/2007, 06/22/2010   Influenza, Quadrivalent, Recombinant, Inj, Pf 05/30/2018, 05/12/2022   Influenza,inj,Quad PF,6+ Mos 07/08/2013, 05/18/2014, 06/02/2018   Influenza-Unspecified 06/27/2021   PFIZER Comirnaty(Gray Top)Covid-19 Tri-Sucrose Vaccine 12/23/2020   PFIZER(Purple Top)SARS-COV-2 Vaccination 09/10/2019, 10/04/2019, 12/07/2019, 12/28/2019, 06/30/2020   PNEUMOCOCCAL CONJUGATE-20 03/18/2021   Pneumococcal Polysaccharide-23 03/27/2010, 09/19/2018   Respiratory Syncytial Virus Vaccine,Recomb Aduvanted(Arexvy) 10/12/2022   Td 03/27/2010   Tdap 12/29/2018, 05/13/2020   Zoster Recombinant(Shingrix) 09/19/2018, 11/18/2018   Zoster, Unspecified 06/27/2021    Past Medical History:  Diagnosis Date   Alcohol abuse    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   Dyshidrotic eczema    GERD (gastroesophageal reflux disease)    Hyperlipidemia    Hypertension    Myalgia 11/01/2007   Likely 2/2 Pravachol    Tobacco abuse     Tobacco History: Social History   Tobacco Use  Smoking Status Former   Current packs/day: 0.00   Average packs/day: 1.5 packs/day for 40.0 years (59.9 ttl pk-yrs)   Types: E-cigarettes, Cigarettes   Start date: 06/03/1983    Quit date: 03/04/2013   Years since quitting: 11.5  Smokeless Tobacco Current  Tobacco Comments   Uses a Vapor.   Ready to quit: Not Answered Counseling given: Not Answered Tobacco comments: Uses a Vapor.   Continue to not smoke  Outpatient Encounter Medications as of 09/06/2024  Medication Sig   albuterol  (PROVENTIL  HFA) 108 (90 Base) MCG/ACT inhaler Inhale 2 puffs into the lungs every 6 (six) hours as needed for wheezing or shortness of breath.   amLODipine  (NORVASC ) 10 MG tablet Take 1 tablet (10 mg total) by mouth daily.   budesonide -formoterol  (SYMBICORT ) 160-4.5 MCG/ACT inhaler Inhale 2 puffs into the lungs 2 (two) times daily.   cetirizine  (ZYRTEC ) 10 MG tablet Take 1 tablet (10 mg total) by mouth daily.  Cholecalciferol  25 MCG (1000 UT) tablet Take 1 tablet (1,000 Units total) by mouth daily.   clobetasol  cream (TEMOVATE ) 0.05 % APPLY 1 APPLICATION TOPICALLY TO ITCHY AREA TWICE A DAY   fluticasone  (FLONASE ) 50 MCG/ACT nasal spray Use 2 spray(s) in each nostril once daily   gabapentin  (NEURONTIN ) 300 MG capsule Take 1 capsule (300 mg total) by mouth at bedtime.   hydrochlorothiazide  (HYDRODIURIL ) 25 MG tablet Take 1 tablet (25 mg total) by mouth daily.   meloxicam  (MOBIC ) 15 MG tablet Take one tablet by mouth once daily as needed for pain   metFORMIN  (GLUCOPHAGE ) 1000 MG tablet Take 1 tablet (1,000 mg total) by mouth 2 (two) times daily with a meal.   pravastatin  (PRAVACHOL ) 20 MG tablet Take 1 tablet (20 mg total) by mouth daily.   senna (SENOKOT) 8.6 MG tablet Take 1 tablet (8.6 mg total) by mouth daily.   tadalafil  (CIALIS ) 10 MG tablet Take 1 tablet (10 mg total) by mouth daily as needed for erectile dysfunction.   valsartan  (DIOVAN ) 320 MG tablet Take 1 tablet by mouth once daily   No facility-administered encounter medications on file as of 09/06/2024.     Review of Systems  Review of Systems  N/a Physical Exam  BP 131/73   Pulse 87   Ht 5' 3 (1.6 m) Comment: per  pt  Wt 170 lb 9.6 oz (77.4 kg)   SpO2 95%   BMI 30.22 kg/m   Wt Readings from Last 5 Encounters:  09/06/24 170 lb 9.6 oz (77.4 kg)  07/14/24 160 lb (72.6 kg)  06/23/24 167 lb 9.6 oz (76 kg)  06/15/24 165 lb (74.8 kg)  06/01/24 165 lb (74.8 kg)    BMI Readings from Last 5 Encounters:  09/06/24 30.22 kg/m  07/14/24 27.90 kg/m  06/23/24 29.22 kg/m  06/15/24 28.77 kg/m  06/01/24 28.77 kg/m     Physical Exam General: Sitting up, no distress Eyes: No icterus Neck: No JVP Cardiovascular: Warm, no edema noted Pulmonary: Clear bilaterally, normal work of breathing, on room air Abdomen: Nondistended   Assessment & Plan:   Asthma: Based on history of bronchitic and atopic symptoms.  Previously pretty well controlled on Advair then switch to Symbicort  at a later date.  Overall, doing well on Symbicort .  To continue.   Emphysema: As seen on CT scan.  Related to prior smoking history.  PFTs in the past 2017 and most recently 2018 without fixed obstruction.  Preop evaluation: Pulmonary medicine is not provide preoperative clearance or other preoperative risk assessment.  Based on the ARISCAT model, patient is low at 1.6% risk of postoperative pulmonary complication if duration of surgery is less than 2 hours.  Duration of surgery greater than 2 hours, patient is intermediate at 13.3% risk of postoperative pulmonary complication.  There are no modifiable risk factors prior to surgery. Recommendations: -- Please provide DuoNebs in the preoperative area and in the PACU postoperatively  Return if symptoms worsen or fail to improve.   Donnice JONELLE Beals, MD 09/06/2024     "

## 2024-09-06 NOTE — Patient Instructions (Signed)
 Nice to see you again  No change in medication  No reason to delay surgery  For dry mouth at night, look at the brand called Biotene.  They have different products to help with dry mouth.  Consider taking it before bed.  Return to clinic as needed

## 2024-09-09 ENCOUNTER — Other Ambulatory Visit: Payer: Self-pay | Admitting: *Deleted

## 2024-09-09 DIAGNOSIS — J329 Chronic sinusitis, unspecified: Secondary | ICD-10-CM

## 2024-09-09 DIAGNOSIS — M7062 Trochanteric bursitis, left hip: Secondary | ICD-10-CM

## 2024-09-09 MED ORDER — FLUTICASONE PROPIONATE 50 MCG/ACT NA SUSP: NASAL | 2 refills | Status: AC

## 2024-09-09 MED ORDER — MELOXICAM 15 MG PO TABS: ORAL_TABLET | ORAL | 2 refills | Status: AC

## 2024-09-09 MED ORDER — CETIRIZINE HCL 10 MG PO TABS: 10.0000 mg | ORAL_TABLET | Freq: Every day | ORAL | 1 refills | Status: AC

## 2024-09-10 ENCOUNTER — Other Ambulatory Visit: Payer: Self-pay | Admitting: Student

## 2024-09-10 NOTE — Telephone Encounter (Signed)
 Medication sent to pharmacy

## 2024-09-10 NOTE — Telephone Encounter (Signed)
 Copy of ov note 09/06/24 was faxed to [facility].

## 2024-09-13 ENCOUNTER — Other Ambulatory Visit: Payer: Self-pay | Admitting: *Deleted

## 2024-09-13 DIAGNOSIS — J449 Chronic obstructive pulmonary disease, unspecified: Secondary | ICD-10-CM

## 2024-09-13 MED ORDER — BUDESONIDE-FORMOTEROL FUMARATE 160-4.5 MCG/ACT IN AERO: 2.0000 | INHALATION_SPRAY | Freq: Two times a day (BID) | RESPIRATORY_TRACT | 12 refills | Status: AC

## 2024-09-14 ENCOUNTER — Encounter: Payer: Self-pay | Admitting: Pulmonary Disease

## 2024-09-16 NOTE — Telephone Encounter (Signed)
 Copied from CRM [id number]. Topic: Clinical - Medical Advice >> Sep 13, 2024 11:32 AM Isabell A wrote: Reason for CRM: Patient states he was seen on 09/06/24 & was supposed to be cleared for his upcoming hip replacement surgery - the provider still has not received the clearance - states paperwork was already sent to Dr.Hunsucker. Requesting a call back once sent.    Callback number: [phone]

## 2024-09-29 ENCOUNTER — Ambulatory Visit: Payer: Self-pay | Admitting: Emergency Medicine

## 2024-09-29 DIAGNOSIS — M87052 Idiopathic aseptic necrosis of left femur: Secondary | ICD-10-CM

## 2024-09-29 MED ORDER — IPRATROPIUM-ALBUTEROL 0.5-2.5 (3) MG/3ML IN SOLN: 3.0000 mL | RESPIRATORY_TRACT | Status: AC | PRN

## 2024-09-29 NOTE — Progress Notes (Signed)
 Surgery orders requested via Epic inbox.

## 2024-09-29 NOTE — H&P (Addendum)
 TOTAL HIP ADMISSION H&P  Patient is admitted for left total hip arthroplasty.  Subjective:  Chief Complaint: left hip pain  HPI: Samuel Chen, 69 y.o. male, has a history of pain and functional disability in the left hip(s) due to avascular necrosis and patient has failed non-surgical conservative treatments for greater than 12 weeks to include NSAID's and/or analgesics and activity modification.  Onset of symptoms was gradual starting about 1 years ago with rapidlly worsening course since that time.The patient noted no past surgery on the left hip(s).  Patient currently rates pain in the left hip at 8 out of 10 with activity. Patient has night pain, worsening of pain with activity and weight bearing, trendelenberg gait, pain that interfers with activities of daily living, and pain with passive range of motion. Patient has evidence of avascular necrosis by imaging studies. This condition presents safety issues increasing the risk of falls. There is no current active infection.  Patient Active Problem List   Diagnosis Date Noted   Epistaxis not due to trauma 01/22/2023   History of vitamin D  deficiency 10/20/2022   Vitamin B12 deficiency 07/10/2021   Chronic idiopathic constipation 05/06/2019   Trochanteric bursitis, left hip 04/28/2017   Type 2 diabetes mellitus with other specified complication (HCC) 04/06/2017   Normocytic anemia 08/16/2015   Health care maintenance 05/18/2014   Muscle cramps 08/13/2013   Avascular necrosis of bone of right hip (HCC) 01/21/2013   COPD (chronic obstructive pulmonary disease)Gold B    Pain in joint, shoulder region 10/16/2009   Allergic rhinitis 04/01/2007   Hyperlipidemia 10/23/2006   ERECTILE DYSFUNCTION 10/23/2006   Alcohol abuse 10/23/2006   History of tobacco abuse 10/23/2006   Essential hypertension 10/23/2006   Gastroesophageal reflux disease 10/23/2006   Dyshidrotic eczema 10/23/2006   ALCOHOLIC HEPATITIS, HX OF 10/23/2006   Past Medical  History:  Diagnosis Date   Alcohol abuse    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   Dyshidrotic eczema    GERD (gastroesophageal reflux disease)    Hyperlipidemia    Hypertension    Myalgia 11/01/2007   Likely 2/2 Pravachol    Tobacco abuse     Past Surgical History:  Procedure Laterality Date   HAND SURGERY  09/02/2000   right hand, tendon repair - tendons damanged at work   REPLACEMENT TOTAL HIP W/  RESURFACING IMPLANTS Right 2012    Current Outpatient Medications  Medication Sig Dispense Refill Last Dose/Taking   albuterol  (PROVENTIL  HFA) 108 (90 Base) MCG/ACT inhaler Inhale 2 puffs into the lungs every 6 (six) hours as needed for wheezing or shortness of breath. 18 g 3    amLODipine  (NORVASC ) 10 MG tablet Take 1 tablet (10 mg total) by mouth daily. 90 tablet 2    budesonide -formoterol  (SYMBICORT ) 160-4.5 MCG/ACT inhaler Inhale 2 puffs into the lungs 2 (two) times daily. 1 each 12    cetirizine  (ZYRTEC ) 10 MG tablet Take 1 tablet (10 mg total) by mouth daily. 90 tablet 1    Cholecalciferol  25 MCG (1000 UT) tablet Take 1 tablet (1,000 Units total) by mouth daily. 30 tablet 2    clobetasol  cream (TEMOVATE ) 0.05 % APPLY 1 APPLICATION TOPICALLY TO ITCHY AREA TWICE A DAY 15 g 0    fluticasone  (FLONASE ) 50 MCG/ACT nasal spray Use 2 spray(s) in each nostril once daily 16 g 2  gabapentin  (NEURONTIN ) 300 MG capsule Take 1 capsule (300 mg total) by mouth at bedtime. 30 capsule 11    hydrochlorothiazide  (HYDRODIURIL ) 25 MG tablet Take 1 tablet (25 mg total) by mouth daily. 90 tablet 2    meloxicam  (MOBIC ) 15 MG tablet Take one tablet by mouth once daily as needed for pain 90 tablet 2    metFORMIN  (GLUCOPHAGE ) 1000 MG tablet Take 1 tablet (1,000 mg total) by mouth 2 (two) times daily with a meal. 62 tablet 3    pravastatin  (PRAVACHOL ) 20 MG tablet Take 1 tablet (20 mg  total) by mouth daily. 30 tablet 11    senna (SENOKOT) 8.6 MG tablet Take 1 tablet (8.6 mg total) by mouth daily. 90 tablet 2    tadalafil  (CIALIS ) 10 MG tablet Take 1 tablet (10 mg total) by mouth daily as needed for erectile dysfunction. 30 tablet 0    valsartan  (DIOVAN ) 320 MG tablet Take 1 tablet by mouth once daily 90 tablet 3    Current Facility-Administered Medications  Medication Dose Route Frequency Provider Last Rate Last Admin   ipratropium-albuterol  (DUONEB) 0.5-2.5 (3) MG/3ML nebulizer solution 3 mL  3 mL Nebulization Q4H PRN Surafel Hilleary M, PA-C       Allergies[1]  Social History   Tobacco Use   Smoking status: Former    Current packs/day: 0.00    Average packs/day: 1.5 packs/day for 40.0 years (59.9 ttl pk-yrs)    Types: E-cigarettes, Cigarettes    Start date: 06/03/1983    Quit date: 03/04/2013    Years since quitting: 11.5   Smokeless tobacco: Current   Tobacco comments:    Uses a Vapor.  Substance Use Topics   Alcohol use: Yes    Alcohol/week: 20.0 standard drinks of alcohol    Types: 20 Cans of beer per week    Comment: daily beer    Family History  Problem Relation Age of Onset   Heart disease Father    Heart disease Sister    Heart attack Sister        41-35yo   Colon cancer Neg Hx    Esophageal cancer Neg Hx    Rectal cancer Neg Hx    Stomach cancer Neg Hx      Review of Systems  Musculoskeletal:  Positive for arthralgias.  All other systems reviewed and are negative.   Objective:  Physical Exam Constitutional:      General: He is not in acute distress.    Appearance: Normal appearance. He is normal weight.  HENT:     Head: Normocephalic and atraumatic.  Eyes:     Extraocular Movements: Extraocular movements intact.     Conjunctiva/sclera: Conjunctivae normal.     Pupils: Pupils are equal, round, and reactive to light.  Cardiovascular:     Rate and Rhythm: Normal rate and regular rhythm.     Pulses: Normal pulses.  Pulmonary:      Effort: Pulmonary effort is normal. No respiratory distress.  Abdominal:     General: There is no distension.     Palpations: Abdomen is soft.  Musculoskeletal:        General: Tenderness present.     Cervical back: Normal range of motion and neck supple.     Comments: TTP over groin, lateral aspect, greater trochanter.  Mild IT band tenderness.  No significant swelling.  No overlying lesions of area of chief complaint.  Decreased strength and ROM due to elicited pain.  Dorsiflexion and plantarflexion intact.  BLE  appear grossly neurovascularly intact.  Gait mildly antalgic.   Skin:    General: Skin is warm and dry.     Capillary Refill: Capillary refill takes less than 2 seconds.     Findings: No erythema or rash.  Neurological:     General: No focal deficit present.     Mental Status: He is alert and oriented to person, place, and time.  Psychiatric:        Mood and Affect: Mood normal.        Behavior: Behavior normal.     Vital signs in last 24 hours: @VSRANGES @  Labs:   Estimated body mass index is 30.22 kg/m as calculated from the following:   Height as of 09/06/24: 5' 3 (1.6 m).   Weight as of 09/06/24: 77.4 kg.   Imaging Review Plain radiographs demonstrate mild degenerative joint disease of the left hip(s). MRI imaging demonstrates avascular necrosis of the left femoral head.  The bone quality appears to be fair to poor for age and reported activity level.      Assessment/Plan:  Avascular necrosis, left hip(s)  The patient history, physical examination, clinical judgement of the provider and imaging studies are consistent with avascular necrosis of the left hip(s) and total hip arthroplasty is deemed medically necessary. The treatment options including medical management, injection therapy, arthroscopy and arthroplasty were discussed at length. The risks and benefits of total hip arthroplasty were presented and reviewed. The risks due to aseptic loosening,  infection, stiffness, dislocation/subluxation,  thromboembolic complications and other imponderables were discussed.  The patient acknowledged the explanation, agreed to proceed with the plan and consent was signed. Patient is being admitted for inpatient treatment for surgery, pain control, PT, OT, prophylactic antibiotics, VTE prophylaxis, progressive ambulation and ADL's and discharge planning.The patient is planning to be discharged home with OPPT   Anticipated LOS equal to or greater than 2 midnights due to - Age 19 and older with one or more of the following:  - Obesity  - Expected need for hospital services (PT, OT, Nursing) required for safe  discharge  - Anticipated need for postoperative skilled nursing care or inpatient rehab  - Active co-morbidities: HTN, HLD, COPD, GERD, hx of DILI (drug induced liver injury), chronic ETOH abuse, borderline anemia with coagulation issues due to liver disease, erectile dysfunction OR   - Unanticipated findings during/Post Surgery: None  - Patient is a high risk of re-admission due to: None     [1]  Allergies Allergen Reactions   Acetaminophen Other (See Comments)    REACTION: (causes liver problems)   Aspirin Other (See Comments)    REACTION: bleeding/nose bleeds   Augmentin  [Amoxicillin -Pot Clavulanate] Diarrhea

## 2024-10-05 NOTE — Progress Notes (Signed)
 Date of any COVID positive Test in last 90 days:  PCP - Libby Blanch, DO  Cardiologist -  Pulmonology- Donnice Beals, MD clearance in 09-06-2024 Epic note  Chest x-ray -  EKG -  2016  repeat Stress Test -  ECHO -  Cardiac Cath -  CT Coronary Calcium  score:  ZIO monitor-   Pacemaker / ICD device [x]  No []  Yes   Spinal Cord Stimulator:[x]  No []  Yes       History of Sleep Apnea? []  No []  Yes   CPAP used?- []  No []  Yes    Medication on DOS: Amlodipine  (NORVASC ), cetirizine  (ZYRTEC ), fluticasone  (FLONASE ), budesonide -formoterol  (SYMBICORT ) inhaler, Albuterol  (PROVENTIL  ) inhaler,  Hold DOS: Valsartan  (DIOVAN ), hydrochlorothiazide  (HYDRODIURIL )  Patient has: []  NO Hx DM   [x]  Pre-DM   []  DM1  []   DM2 Does the patient monitor blood sugar?   []  N/A   [x]  No []  Yes  Last A1c was: 6.0  on  05-26-2024     Metformin  (GLUCOPHAGE ) bid-  HOLD DOS  Blood Thinner / Instructions: none Aspirin Instructions:  none  Activity level: Able to walk up 2 flights of stairs without becoming significantly short of breath or having chest pain?   []    Yes   []  No,  would have:  Patient can perform ADLs without assistance.  []   Yes    []  No   Comments:   Anesthesia review: HTN, Pre-DM, chronic ETOH abuse - hepatitis, COPD, anemia, GERD, vapes   Patient denies any S&S of respiratory illness or Covid - no shortness of breath, fever, cough or chest pain at PAT appointment.  Patient verbalized understanding and agreement to the Pre-Surgical Instructions that were given to them at this PAT appointment. Patient was also educated of the need to review these PAT instructions again prior to his surgery.I reviewed the appropriate phone numbers to call if they have any and questions or concerns.

## 2024-10-06 ENCOUNTER — Encounter (HOSPITAL_COMMUNITY): Payer: Self-pay

## 2024-10-06 ENCOUNTER — Encounter (HOSPITAL_COMMUNITY)
Admission: RE | Admit: 2024-10-06 | Discharge: 2024-10-06 | Disposition: A | Source: Ambulatory Visit | Attending: Orthopedic Surgery | Admitting: Orthopedic Surgery

## 2024-10-06 ENCOUNTER — Other Ambulatory Visit: Payer: Self-pay

## 2024-10-06 VITALS — BP 139/60 | Temp 97.9°F | Resp 16 | Ht 63.0 in | Wt 162.0 lb

## 2024-10-06 DIAGNOSIS — Z01818 Encounter for other preprocedural examination: Secondary | ICD-10-CM | POA: Insufficient documentation

## 2024-10-06 DIAGNOSIS — R9431 Abnormal electrocardiogram [ECG] [EKG]: Secondary | ICD-10-CM | POA: Insufficient documentation

## 2024-10-06 DIAGNOSIS — M87052 Idiopathic aseptic necrosis of left femur: Secondary | ICD-10-CM | POA: Insufficient documentation

## 2024-10-06 DIAGNOSIS — I10 Essential (primary) hypertension: Secondary | ICD-10-CM | POA: Insufficient documentation

## 2024-10-06 HISTORY — DX: Inflammatory liver disease, unspecified: K75.9

## 2024-10-06 HISTORY — DX: Prediabetes: R73.03

## 2024-10-06 HISTORY — DX: Pneumonia, unspecified organism: J18.9

## 2024-10-06 HISTORY — DX: Unspecified asthma, uncomplicated: J45.909

## 2024-10-06 LAB — COMPREHENSIVE METABOLIC PANEL WITH GFR
ALT: 99 U/L — ABNORMAL HIGH (ref 0–44)
AST: 80 U/L — ABNORMAL HIGH (ref 15–41)
Albumin: 4.6 g/dL (ref 3.5–5.0)
Alkaline Phosphatase: 71 U/L (ref 38–126)
Anion gap: 15 (ref 5–15)
BUN: 23 mg/dL (ref 8–23)
CO2: 20 mmol/L — ABNORMAL LOW (ref 22–32)
Calcium: 10.2 mg/dL (ref 8.9–10.3)
Chloride: 100 mmol/L (ref 98–111)
Creatinine, Ser: 1.32 mg/dL — ABNORMAL HIGH (ref 0.61–1.24)
GFR, Estimated: 59 mL/min — ABNORMAL LOW
Glucose, Bld: 115 mg/dL — ABNORMAL HIGH (ref 70–99)
Potassium: 4.4 mmol/L (ref 3.5–5.1)
Sodium: 135 mmol/L (ref 135–145)
Total Bilirubin: 0.4 mg/dL (ref 0.0–1.2)
Total Protein: 8.1 g/dL (ref 6.5–8.1)

## 2024-10-06 LAB — CBC WITH DIFFERENTIAL/PLATELET
Abs Immature Granulocytes: 0.02 10*3/uL (ref 0.00–0.07)
Basophils Absolute: 0.1 10*3/uL (ref 0.0–0.1)
Basophils Relative: 1 %
Eosinophils Absolute: 0 10*3/uL (ref 0.0–0.5)
Eosinophils Relative: 0 %
HCT: 41 % (ref 39.0–52.0)
Hemoglobin: 13.4 g/dL (ref 13.0–17.0)
Immature Granulocytes: 0 %
Lymphocytes Relative: 22 %
Lymphs Abs: 1.9 10*3/uL (ref 0.7–4.0)
MCH: 31.7 pg (ref 26.0–34.0)
MCHC: 32.7 g/dL (ref 30.0–36.0)
MCV: 96.9 fL (ref 80.0–100.0)
Monocytes Absolute: 0.9 10*3/uL (ref 0.1–1.0)
Monocytes Relative: 10 %
Neutro Abs: 5.8 10*3/uL (ref 1.7–7.7)
Neutrophils Relative %: 67 %
Platelets: 167 10*3/uL (ref 150–400)
RBC: 4.23 MIL/uL (ref 4.22–5.81)
RDW: 12.6 % (ref 11.5–15.5)
WBC: 8.6 10*3/uL (ref 4.0–10.5)
nRBC: 0 % (ref 0.0–0.2)

## 2024-10-06 LAB — TYPE AND SCREEN
ABO/RH(D): A POS
Antibody Screen: NEGATIVE

## 2024-10-07 LAB — SURGICAL PCR SCREEN
MRSA, PCR: POSITIVE — AB
Staphylococcus aureus: POSITIVE — AB

## 2024-10-07 NOTE — Progress Notes (Signed)
 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 Appropriate notes have been placed on the patient's chart. This note has been routed to Dr Edna / Bernarda Mclean, PA for review. The Patient's surgery, left posterior THA,  is currently scheduled for:10-18-2024 at [facility].  Shawnee Aloe, BSN, CVRN-BC   Pre-Surgical Testing Nurse [facility]  [phone]

## 2024-10-18 ENCOUNTER — Encounter (HOSPITAL_COMMUNITY): Admission: RE | Payer: Self-pay | Source: Ambulatory Visit

## 2024-10-18 ENCOUNTER — Ambulatory Visit (HOSPITAL_COMMUNITY): Admission: RE | Admit: 2024-10-18 | Admitting: Orthopedic Surgery
